# Patient Record
Sex: Male | Born: 1955
Health system: Southern US, Community
[De-identification: ages and names within clinical notes are randomized; demographics above are authoritative.]

## PROBLEM LIST (undated history)

## (undated) DIAGNOSIS — R55 Syncope and collapse: Secondary | ICD-10-CM

## (undated) DIAGNOSIS — Z953 Presence of xenogenic heart valve: Secondary | ICD-10-CM

## (undated) DIAGNOSIS — Z8673 Personal history of transient ischemic attack (TIA), and cerebral infarction without residual deficits: Secondary | ICD-10-CM

## (undated) DIAGNOSIS — I350 Nonrheumatic aortic (valve) stenosis: Secondary | ICD-10-CM

## (undated) DIAGNOSIS — E785 Hyperlipidemia, unspecified: Secondary | ICD-10-CM

## (undated) DIAGNOSIS — D696 Thrombocytopenia, unspecified: Secondary | ICD-10-CM

---

## 1999-12-31 ENCOUNTER — Emergency Department (HOSPITAL_COMMUNITY): Admission: EM | Admit: 1999-12-31 | Discharge: 1999-12-31 | Payer: Self-pay

## 2005-09-13 ENCOUNTER — Encounter: Admission: RE | Admit: 2005-09-13 | Discharge: 2005-09-13 | Payer: Self-pay | Admitting: Family Medicine

## 2009-08-01 ENCOUNTER — Encounter: Admission: RE | Admit: 2009-08-01 | Discharge: 2009-08-01 | Payer: Self-pay | Admitting: Family Medicine

## 2011-05-25 HISTORY — PX: POSTERIOR CERVICAL LAMINECTOMY: SHX2248

## 2016-06-22 ENCOUNTER — Telehealth: Payer: Self-pay | Admitting: Cardiovascular Disease

## 2016-06-22 NOTE — Telephone Encounter (Signed)
Received records from [facility] for appointment on 07/07/16 with Dr Allyson SabalBerry.  Records put with Dr Hazle CocaBerry's schedule for 07/07/16. lp

## 2016-07-07 ENCOUNTER — Ambulatory Visit (INDEPENDENT_AMBULATORY_CARE_PROVIDER_SITE_OTHER): Payer: BLUE CROSS/BLUE SHIELD | Admitting: Cardiovascular Disease

## 2016-07-07 ENCOUNTER — Other Ambulatory Visit: Payer: Self-pay

## 2016-07-07 ENCOUNTER — Encounter: Payer: Self-pay | Admitting: Cardiovascular Disease

## 2016-07-07 VITALS — BP 158/78 | HR 67 | Ht 71.0 in | Wt 197.8 lb

## 2016-07-07 DIAGNOSIS — I350 Nonrheumatic aortic (valve) stenosis: Secondary | ICD-10-CM

## 2016-07-07 DIAGNOSIS — R0989 Other specified symptoms and signs involving the circulatory and respiratory systems: Secondary | ICD-10-CM

## 2016-07-07 DIAGNOSIS — R011 Cardiac murmur, unspecified: Secondary | ICD-10-CM

## 2016-07-07 NOTE — Assessment & Plan Note (Signed)
Mr. Jerry Odonnell was referred to me by Dr. Melrose NakayamaKindle at [facility] for evaluation of critical AS. He had a murmur which she's had for years auscultated and a 2-D echocardiogram performed 04/28/16 revealing normal LV size and function, moderate concentric LVH with critical AS. His mean gradient was 86 m mercury with a peak velocity of 543 cm/s. His valve was trileaflet and he had a normal descending thoracic aorta. He is essentially asymptomatic. He walks 4-5 miles a day without limitation. He had one episode of dizziness back in August. He will be right left heart cath and consideration of aortic valve replacement. Given the fact that he has a "low risk AS patient". He most likely is not a TAVR candidate. I will arrange for him to undergo right and left heart cath sometime within the next week.The patient understands that risks included but are not limited to stroke (1 in 1000), death (1 in 1000), kidney failure [usually temporary] (1 in 500), bleeding (1 in 200), allergic reaction [possibly serious] (1 in 200). The patient understands and agrees to proceed

## 2016-07-07 NOTE — Patient Instructions (Signed)
Medication Instructions: Your physician recommends that you continue on your current medications as directed. Please refer to the Current Medication list given to you today.   Testing/Procedures: Your physician has requested that you have a R/L cardiac catheterization with Dr. Allyson SabalBerry (when back from trip)--Dx: Aortic Stenosis. Cardiac catheterization is used to diagnose and/or treat various heart conditions. Doctors may recommend this procedure for a number of different reasons. The most common reason is to evaluate chest pain. Chest pain can be a symptom of coronary artery disease (CAD), and cardiac catheterization can show whether plaque is narrowing or blocking your heart's arteries. This procedure is also used to evaluate the valves, as well as measure the blood flow and oxygen levels in different parts of your heart. For further information please visit [email or web address].   Following your catheterization, you will not be allowed to drive for 3 days.  No lifting, pushing, or pulling greater that 10 pounds is allowed for 1 week.  You will be required to have the following tests prior to the procedure:  1. Blood work-the blood work can be done no more than 14 days prior to the procedure.  It can be done at any [facility] lab.  There is one downstairs on the first floor of this building and one in the [facility] building [phone]([address], [address]).  2. Chest Xray-the chest xray order has already been placed at the [facility].      Your physician has requested that you have a carotid duplex. This test is an ultrasound of the carotid arteries in your neck. It looks at blood flow through these arteries that supply the brain with blood. Allow one hour for this exam. There are no restrictions or special instructions.

## 2016-07-07 NOTE — Progress Notes (Signed)
07/07/2016 Jerry Odonnell   01-13-1956  [id number]  Primary Physician Jerry Odonnell, Jerry Odonnell, Jerry Odonnell Primary Cardiologist: Jerry GessJonathan Odonnell Jerry Odonnell Jerry Odonnell Jerry Odonnell, Jerry Odonnell, Jerry Odonnell, Jerry Odonnell  HPI:  Jerry Odonnell is a delightful 61 year old fit-appearing widowed Caucasian male (wife died of colon cancer 4 years ago), father of 2 children with no grandchildren who works as an Presenter, broadcastingN D manager at [employer] . He is accompanied by a friend, Jerry Odonnell. He was referred for evaluation and treatment of severe aortic stenosis. He has no chronic risk factors. He is active and walks 4-5 miles a day. PCP heard a murmur and followed up with a 2-D echocardiogram 04/28/16 that revealed normal LV size and function, moderate concentric LVH with severe aortic stenosis. The mean gradient was 86 mm of mercury. He has had one episode of dizziness back in August. He denies chest pain or shortness of breath.   Current Outpatient Prescriptions  Medication Sig Dispense Refill  . aspirin EC 81 MG tablet Take 81 mg by mouth daily.    [name]. atorvastatin (LIPITOR) 10 MG tablet Take 10 mg by mouth daily.    [name]. EPINEPHrine (EPIPEN 2-PAK) 0.3 mg/0.3 mL IJ SOAJ injection Inject 0.3 mLs into the muscle as directed.    . fluticasone (FLONASE) 50 MCG/ACT nasal spray Place 1 spray into the nose as directed.    . meclizine (ANTIVERT) 25 MG tablet Take 25 mg by mouth as directed.    [name]. CIALIS 5 MG tablet Take 5 mg by mouth daily as needed.  3   No current facility-administered medications for this visit.     Allergies  Allergen Reactions  . Bee Venom Swelling    Social History   Social History  . Marital status: Widowed    Spouse name: N/A  . Number of children: N/A  . Years of education: N/A   Occupational History  . Not on file.   Social History Main Topics  . Smoking status: Never Smoker  . Smokeless tobacco: Never Used  . Alcohol use Not on file  . Drug use: Unknown  . Sexual activity: Not on file   Other Topics Concern  . Not on file   Social  History Narrative  . No narrative on file     Review of Systems: General: negative for chills, fever, night sweats or weight changes.  Cardiovascular: negative for chest pain, dyspnea on exertion, edema, orthopnea, palpitations, paroxysmal nocturnal dyspnea or shortness of breath Dermatological: negative for rash Respiratory: negative for cough or wheezing Urologic: negative for hematuria Abdominal: negative for nausea, vomiting, diarrhea, bright red blood per rectum, melena, or hematemesis Neurologic: negative for visual changes, syncope, or dizziness All other systems reviewed and are otherwise negative except as noted above.    Blood pressure (!) 158/78, pulse 67, height 5\' 11"  (1.803 m), weight 197 lb 12.8 oz (89.7 kg).  General appearance: alert and no distress Neck: no adenopathy, no JVD, supple, symmetrical, trachea midline, thyroid not enlarged, symmetric, no tenderness/mass/nodules and Soft bilateral carotid bruits Lungs: clear to auscultation bilaterally Heart: Typical aortic stenosis murmur Extremities: extremities normal, atraumatic, no cyanosis or edema  EKG normal sinus rhythm at 67 with evidence of LVH and nonspecific ST-T wave changes. I personally reviewed this EKG  ASSESSMENT AND PLAN:   Critical aortic valve stenosis Jerry Odonnell was referred to me by [facility]. Melrose [facility]Kindle at [facility] for evaluation of critical AS. He had a murmur which she's had for years auscultated and a 2-D echocardiogram performed 04/28/16 revealing  normal LV size and function, moderate concentric LVH with critical AS. His mean gradient was 86 m mercury with a peak velocity of 543 cm/s. His valve was trileaflet and he had a normal descending thoracic aorta. He is essentially asymptomatic. He walks 4-5 miles a day without limitation. He had one episode of dizziness back in August. He will be right left heart cath and consideration of aortic valve replacement. Given the fact that he has a "low risk AS patient". He  most likely is not a TAVR candidate. I will arrange for him to undergo right and left heart cath sometime within the next week.The patient understands that risks included but are not limited to stroke (1 in 1000), death (1 in 1000), kidney failure [usually temporary] (1 in 500), bleeding (1 in 200), allergic reaction [possibly serious] (1 in 200). The patient understands and agrees to proceed      Jerry Odonnell Jerry Odonnell [facility], [facility] 07/07/2016 12:10 PM

## 2016-07-09 ENCOUNTER — Inpatient Hospital Stay (HOSPITAL_COMMUNITY)
Admission: EM | Admit: 2016-07-09 | Discharge: 2016-07-19 | DRG: 217 | Disposition: A | Discharge status: Home / Self Care | Payer: BLUE CROSS/BLUE SHIELD | Attending: Thoracic Surgery (Cardiothoracic Vascular Surgery) | Admitting: Thoracic Surgery (Cardiothoracic Vascular Surgery)

## 2016-07-09 ENCOUNTER — Telehealth: Payer: Self-pay | Admitting: Cardiovascular Disease

## 2016-07-09 ENCOUNTER — Encounter (HOSPITAL_COMMUNITY)
Admission: EM | Disposition: A | Discharge status: Home / Self Care | Payer: Self-pay | Source: Home / Self Care | Attending: Interventional Cardiology

## 2016-07-09 ENCOUNTER — Emergency Department (HOSPITAL_COMMUNITY): Payer: BLUE CROSS/BLUE SHIELD

## 2016-07-09 ENCOUNTER — Ambulatory Visit (HOSPITAL_COMMUNITY): Admit: 2016-07-09 | Payer: BLUE CROSS/BLUE SHIELD | Admitting: Cardiovascular Disease

## 2016-07-09 ENCOUNTER — Encounter (HOSPITAL_COMMUNITY): Payer: Self-pay | Admitting: Emergency Medicine

## 2016-07-09 DIAGNOSIS — Z91030 Bee allergy status: Secondary | ICD-10-CM

## 2016-07-09 DIAGNOSIS — I2510 Atherosclerotic heart disease of native coronary artery without angina pectoris: Secondary | ICD-10-CM | POA: Diagnosis not present

## 2016-07-09 DIAGNOSIS — Z953 Presence of xenogenic heart valve: Secondary | ICD-10-CM

## 2016-07-09 DIAGNOSIS — E785 Hyperlipidemia, unspecified: Secondary | ICD-10-CM | POA: Diagnosis present

## 2016-07-09 DIAGNOSIS — L7622 Postprocedural hemorrhage and hematoma of skin and subcutaneous tissue following other procedure: Secondary | ICD-10-CM | POA: Diagnosis not present

## 2016-07-09 DIAGNOSIS — Y838 Other surgical procedures as the cause of abnormal reaction of the patient, or of later complication, without mention of misadventure at the time of the procedure: Secondary | ICD-10-CM | POA: Diagnosis not present

## 2016-07-09 DIAGNOSIS — Z7982 Long term (current) use of aspirin: Secondary | ICD-10-CM

## 2016-07-09 DIAGNOSIS — I350 Nonrheumatic aortic (valve) stenosis: Secondary | ICD-10-CM

## 2016-07-09 DIAGNOSIS — Z8673 Personal history of transient ischemic attack (TIA), and cerebral infarction without residual deficits: Secondary | ICD-10-CM

## 2016-07-09 DIAGNOSIS — J90 Pleural effusion, not elsewhere classified: Secondary | ICD-10-CM | POA: Diagnosis not present

## 2016-07-09 DIAGNOSIS — R748 Abnormal levels of other serum enzymes: Secondary | ICD-10-CM

## 2016-07-09 DIAGNOSIS — D696 Thrombocytopenia, unspecified: Secondary | ICD-10-CM | POA: Diagnosis present

## 2016-07-09 DIAGNOSIS — D62 Acute posthemorrhagic anemia: Secondary | ICD-10-CM | POA: Diagnosis not present

## 2016-07-09 DIAGNOSIS — E8770 Fluid overload, unspecified: Secondary | ICD-10-CM | POA: Diagnosis not present

## 2016-07-09 DIAGNOSIS — R791 Abnormal coagulation profile: Secondary | ICD-10-CM | POA: Diagnosis present

## 2016-07-09 DIAGNOSIS — I7409 Other arterial embolism and thrombosis of abdominal aorta: Secondary | ICD-10-CM

## 2016-07-09 DIAGNOSIS — R55 Syncope and collapse: Secondary | ICD-10-CM | POA: Diagnosis not present

## 2016-07-09 DIAGNOSIS — J9811 Atelectasis: Secondary | ICD-10-CM | POA: Diagnosis not present

## 2016-07-09 DIAGNOSIS — Z4682 Encounter for fitting and adjustment of non-vascular catheter: Secondary | ICD-10-CM

## 2016-07-09 DIAGNOSIS — I447 Left bundle-branch block, unspecified: Secondary | ICD-10-CM | POA: Diagnosis present

## 2016-07-09 DIAGNOSIS — Z79899 Other long term (current) drug therapy: Secondary | ICD-10-CM

## 2016-07-09 DIAGNOSIS — J939 Pneumothorax, unspecified: Secondary | ICD-10-CM

## 2016-07-09 DIAGNOSIS — I7100 Dissection of unspecified site of aorta: Secondary | ICD-10-CM

## 2016-07-09 HISTORY — PX: RIGHT/LEFT HEART CATH AND CORONARY ANGIOGRAPHY: CATH118266

## 2016-07-09 HISTORY — DX: Presence of xenogenic heart valve: Z95.3

## 2016-07-09 HISTORY — DX: Syncope and collapse: R55

## 2016-07-09 HISTORY — DX: Hyperlipidemia, unspecified: E78.5

## 2016-07-09 HISTORY — DX: Nonrheumatic aortic (valve) stenosis: I35.0

## 2016-07-09 HISTORY — DX: Thrombocytopenia, unspecified: D69.6

## 2016-07-09 HISTORY — DX: Personal history of transient ischemic attack (TIA), and cerebral infarction without residual deficits: Z86.73

## 2016-07-09 LAB — POCT I-STAT 3, ART BLOOD GAS (G3+)
ACID-BASE DEFICIT: 2 mmol/L (ref 0.0–2.0)
Bicarbonate: 22.8 mmol/L (ref 20.0–28.0)
O2 Saturation: 99 %
PH ART: 7.394 (ref 7.350–7.450)
TCO2: 24 mmol/L (ref 0–100)
pCO2 arterial: 37.3 mmHg (ref 32.0–48.0)
pO2, Arterial: 116 mmHg — ABNORMAL HIGH (ref 83.0–108.0)

## 2016-07-09 LAB — BASIC METABOLIC PANEL
Anion gap: 8 (ref 5–15)
BUN: 17 mg/dL (ref 6–20)
CO2: 23 mmol/L (ref 22–32)
CREATININE: 0.99 mg/dL (ref 0.61–1.24)
Calcium: 9.1 mg/dL (ref 8.9–10.3)
Chloride: 107 mmol/L (ref 101–111)
GFR calc Af Amer: >60 mL/min (ref >60)
Glucose, Bld: 126 mg/dL — ABNORMAL HIGH (ref 65–99)
Potassium: 4.2 mmol/L (ref 3.5–5.1)
SODIUM: 138 mmol/L (ref 135–145)

## 2016-07-09 LAB — CBC
HCT: 42.6 % (ref 39.0–52.0)
Hemoglobin: 14.7 g/dL (ref 13.0–17.0)
MCH: 31.7 pg (ref 26.0–34.0)
MCHC: 34.5 g/dL (ref 30.0–36.0)
MCV: 92 fL (ref 78.0–100.0)
PLATELETS: 136 10*3/uL — AB (ref 150–400)
RBC: 4.63 MIL/uL (ref 4.22–5.81)
RDW: 13 % (ref 11.5–15.5)
WBC: 8.5 10*3/uL (ref 4.0–10.5)

## 2016-07-09 LAB — POCT I-STAT 3, VENOUS BLOOD GAS (G3P V)
Acid-base deficit: 2 mmol/L (ref 0.0–2.0)
Acid-base deficit: 2 mmol/L (ref 0.0–2.0)
Bicarbonate: 21.7 mmol/L (ref 20.0–28.0)
Bicarbonate: 23.1 mmol/L (ref 20.0–28.0)
O2 SAT: 76 %
O2 Saturation: 72 %
PCO2 VEN: 40.7 mmHg — AB (ref 44.0–60.0)
PH VEN: 7.362 (ref 7.250–7.430)
PH VEN: 7.405 (ref 7.250–7.430)
TCO2: 23 mmol/L (ref 0–100)
TCO2: 24 mmol/L (ref 0–100)
pCO2, Ven: 34.5 mmHg — ABNORMAL LOW (ref 44.0–60.0)
pO2, Ven: 39 mmHg (ref 32.0–45.0)
pO2, Ven: 40 mmHg (ref 32.0–45.0)

## 2016-07-09 LAB — PROTIME-INR
INR: 1.17
Prothrombin Time: 15 seconds (ref 11.4–15.2)

## 2016-07-09 LAB — TROPONIN I
TROPONIN I: 0.22 ng/mL — AB (ref <0.03)
TROPONIN I: 0.15 ng/mL — AB (ref <0.03)
Troponin I: 0.08 ng/mL (ref <0.03)

## 2016-07-09 LAB — HEPATIC FUNCTION PANEL
ALBUMIN: 3.8 g/dL (ref 3.5–5.0)
ALT: 42 U/L (ref 17–63)
AST: 28 U/L (ref 15–41)
Alkaline Phosphatase: 70 U/L (ref 38–126)
Bilirubin, Direct: 0.2 mg/dL (ref 0.1–0.5)
Indirect Bilirubin: 1.2 mg/dL — ABNORMAL HIGH (ref 0.3–0.9)
TOTAL PROTEIN: 5.8 g/dL — AB (ref 6.5–8.1)
Total Bilirubin: 1.4 mg/dL — ABNORMAL HIGH (ref 0.3–1.2)

## 2016-07-09 LAB — MAGNESIUM: MAGNESIUM: 2.1 mg/dL (ref 1.7–2.4)

## 2016-07-09 LAB — URINALYSIS, ROUTINE W REFLEX MICROSCOPIC
Bilirubin Urine: NEGATIVE
GLUCOSE, UA: NEGATIVE mg/dL
Hgb urine dipstick: NEGATIVE
KETONES UR: 5 mg/dL — AB
LEUKOCYTES UA: NEGATIVE
NITRITE: NEGATIVE
PROTEIN: 100 mg/dL — AB
Specific Gravity, Urine: 1.026 (ref 1.005–1.030)
pH: 5 (ref 5.0–8.0)

## 2016-07-09 LAB — T4, FREE: FREE T4: 1.32 ng/dL — AB (ref 0.61–1.12)

## 2016-07-09 LAB — TSH: TSH: 0.415 u[IU]/mL (ref 0.350–4.500)

## 2016-07-09 SURGERY — RIGHT/LEFT HEART CATH AND CORONARY ANGIOGRAPHY
Anesthesia: LOCAL

## 2016-07-09 MED ORDER — IOPAMIDOL (ISOVUE-370) INJECTION 76%
INTRAVENOUS | Status: DC | PRN
  Administered 2016-07-09: 120 mL via INTRA_ARTERIAL

## 2016-07-09 MED ORDER — SODIUM CHLORIDE 0.9 % IV SOLN
250.0000 mL | INTRAVENOUS | Status: DC | PRN
  Administered 2016-07-14: 12:00:00 via INTRAVENOUS

## 2016-07-09 MED ORDER — MIDAZOLAM HCL 2 MG/2ML IJ SOLN
INTRAMUSCULAR | Status: AC
  Filled 2016-07-09: qty 2

## 2016-07-09 MED ORDER — SODIUM CHLORIDE 0.9% FLUSH
3.0000 mL | Freq: Two times a day (BID) | INTRAVENOUS | Status: DC
  Administered 2016-07-10 – 2016-07-13 (×4): 3 mL via INTRAVENOUS

## 2016-07-09 MED ORDER — SODIUM CHLORIDE 0.9 % IV SOLN: 250.0000 mL | INTRAVENOUS | Status: DC | PRN

## 2016-07-09 MED ORDER — LIDOCAINE HCL (PF) 1 % IJ SOLN
INTRAMUSCULAR | Status: AC
  Filled 2016-07-09: qty 30

## 2016-07-09 MED ORDER — SODIUM CHLORIDE 0.9 % WEIGHT BASED INFUSION: 3.0000 mL/kg/h | INTRAVENOUS | Status: DC

## 2016-07-09 MED ORDER — ACETAMINOPHEN 325 MG PO TABS: 650.0000 mg | ORAL_TABLET | ORAL | Status: DC | PRN

## 2016-07-09 MED ORDER — MIDAZOLAM HCL 2 MG/2ML IJ SOLN
INTRAMUSCULAR | Status: DC | PRN
  Administered 2016-07-09 (×5): 1 mg via INTRAVENOUS

## 2016-07-09 MED ORDER — VERAPAMIL HCL 2.5 MG/ML IV SOLN
INTRAVENOUS | Status: DC | PRN
  Administered 2016-07-09: 10 mL via INTRA_ARTERIAL

## 2016-07-09 MED ORDER — VERAPAMIL HCL 2.5 MG/ML IV SOLN
INTRAVENOUS | Status: AC
  Filled 2016-07-09: qty 2

## 2016-07-09 MED ORDER — FENTANYL CITRATE (PF) 100 MCG/2ML IJ SOLN
INTRAMUSCULAR | Status: AC
  Filled 2016-07-09: qty 2

## 2016-07-09 MED ORDER — ALPRAZOLAM 0.25 MG PO TABS: 0.2500 mg | ORAL_TABLET | Freq: Two times a day (BID) | ORAL | Status: DC | PRN

## 2016-07-09 MED ORDER — SODIUM CHLORIDE 0.9 % IV SOLN: INTRAVENOUS | Status: AC

## 2016-07-09 MED ORDER — ASPIRIN 81 MG PO CHEW: 324.0000 mg | CHEWABLE_TABLET | ORAL | Status: DC

## 2016-07-09 MED ORDER — FENTANYL CITRATE (PF) 100 MCG/2ML IJ SOLN
INTRAMUSCULAR | Status: DC | PRN
  Administered 2016-07-09 (×5): 25 ug via INTRAVENOUS

## 2016-07-09 MED ORDER — IOPAMIDOL (ISOVUE-370) INJECTION 76%
INTRAVENOUS | Status: AC
  Filled 2016-07-09: qty 50

## 2016-07-09 MED ORDER — NITROGLYCERIN 0.4 MG SL SUBL: 0.4000 mg | SUBLINGUAL_TABLET | SUBLINGUAL | Status: DC | PRN

## 2016-07-09 MED ORDER — HEPARIN SODIUM (PORCINE) 1000 UNIT/ML IJ SOLN
INTRAMUSCULAR | Status: DC | PRN
  Administered 2016-07-09: 5000 [IU] via INTRAVENOUS

## 2016-07-09 MED ORDER — SODIUM CHLORIDE 0.9 % IV SOLN
INTRAVENOUS | Status: DC
  Administered 2016-07-09: 13:00:00 via INTRAVENOUS
  Administered 2016-07-09: 250 mL via INTRAVENOUS

## 2016-07-09 MED ORDER — ONDANSETRON HCL 4 MG/2ML IJ SOLN: 4.0000 mg | Freq: Four times a day (QID) | INTRAMUSCULAR | Status: DC | PRN

## 2016-07-09 MED ORDER — ZOLPIDEM TARTRATE 5 MG PO TABS: 5.0000 mg | ORAL_TABLET | Freq: Every evening | ORAL | Status: DC | PRN

## 2016-07-09 MED ORDER — IOPAMIDOL (ISOVUE-370) INJECTION 76%
INTRAVENOUS | Status: AC
  Filled 2016-07-09: qty 100

## 2016-07-09 MED ORDER — ASPIRIN EC 81 MG PO TBEC: 81.0000 mg | DELAYED_RELEASE_TABLET | Freq: Every day | ORAL | Status: DC

## 2016-07-09 MED ORDER — ASPIRIN 81 MG PO CHEW: 81.0000 mg | CHEWABLE_TABLET | ORAL | Status: DC

## 2016-07-09 MED ORDER — HEPARIN SODIUM (PORCINE) 1000 UNIT/ML IJ SOLN
INTRAMUSCULAR | Status: AC
  Filled 2016-07-09: qty 1

## 2016-07-09 MED ORDER — HEPARIN (PORCINE) IN NACL 2-0.9 UNIT/ML-% IJ SOLN
INTRAMUSCULAR | Status: DC | PRN
  Administered 2016-07-09: 1000 mL

## 2016-07-09 MED ORDER — ASPIRIN 300 MG RE SUPP: 300.0000 mg | RECTAL | Status: DC

## 2016-07-09 MED ORDER — HEPARIN SODIUM (PORCINE) 5000 UNIT/ML IJ SOLN: 5000.0000 [IU] | Freq: Three times a day (TID) | INTRAMUSCULAR | Status: DC

## 2016-07-09 MED ORDER — ASPIRIN EC 81 MG PO TBEC
81.0000 mg | DELAYED_RELEASE_TABLET | Freq: Every day | ORAL | Status: DC
  Administered 2016-07-10 – 2016-07-13 (×4): 81 mg via ORAL
  Filled 2016-07-09 (×4): qty 1

## 2016-07-09 MED ORDER — ATORVASTATIN CALCIUM 10 MG PO TABS
10.0000 mg | ORAL_TABLET | Freq: Every day | ORAL | Status: DC
  Administered 2016-07-10 – 2016-07-13 (×4): 10 mg via ORAL
  Filled 2016-07-09 (×4): qty 1

## 2016-07-09 MED ORDER — LIDOCAINE HCL (PF) 1 % IJ SOLN
INTRAMUSCULAR | Status: DC | PRN
  Administered 2016-07-09 (×2): 2 mL

## 2016-07-09 MED ORDER — SODIUM CHLORIDE 0.9% FLUSH: 3.0000 mL | Freq: Two times a day (BID) | INTRAVENOUS | Status: DC

## 2016-07-09 MED ORDER — HEPARIN (PORCINE) IN NACL 2-0.9 UNIT/ML-% IJ SOLN
INTRAMUSCULAR | Status: AC
  Filled 2016-07-09: qty 1000

## 2016-07-09 MED ORDER — SODIUM CHLORIDE 0.9 % WEIGHT BASED INFUSION: 1.0000 mL/kg/h | INTRAVENOUS | Status: DC

## 2016-07-09 MED ORDER — HEPARIN SODIUM (PORCINE) 5000 UNIT/ML IJ SOLN
5000.0000 [IU] | Freq: Three times a day (TID) | INTRAMUSCULAR | Status: DC
  Administered 2016-07-10 – 2016-07-13 (×11): 5000 [IU] via SUBCUTANEOUS
  Filled 2016-07-09 (×11): qty 1

## 2016-07-09 MED ORDER — SODIUM CHLORIDE 0.9% FLUSH: 3.0000 mL | INTRAVENOUS | Status: DC | PRN

## 2016-07-09 MED ORDER — SODIUM CHLORIDE 0.9% FLUSH
3.0000 mL | INTRAVENOUS | Status: DC | PRN
  Administered 2016-07-10: 3 mL via INTRAVENOUS
  Filled 2016-07-09: qty 3

## 2016-07-09 MED ORDER — HEPARIN (PORCINE) IN NACL 2-0.9 UNIT/ML-% IJ SOLN
INTRAMUSCULAR | Status: DC | PRN
  Administered 2016-07-09: 10 mL via INTRA_ARTERIAL

## 2016-07-09 SURGICAL SUPPLY — 18 items
CATH BALLN WEDGE 5F 110CM (CATHETERS) ×2 IMPLANT
CATH INFINITI JR4 5F (CATHETERS) ×2 IMPLANT
CATH LAUNCHER 5F RADR (CATHETERS) ×1 IMPLANT
CATH OPTITORQUE TIG 4.0 5F (CATHETERS) ×2 IMPLANT
CATH SITESEER 5F NTR (CATHETERS) ×2 IMPLANT
CATHETER LAUNCHER 5F RADR (CATHETERS) ×2
COVER PRB 48X5XTLSCP FOLD TPE (BAG) ×1 IMPLANT
COVER PROBE 5X48 (BAG) ×1
DEVICE RAD COMP TR BAND LRG (VASCULAR PRODUCTS) ×2 IMPLANT
GLIDESHEATH SLEND A-KIT 6F 22G (SHEATH) ×2 IMPLANT
GLIDESHEATH SLEND SS 6F .021 (SHEATH) ×2 IMPLANT
GUIDEWIRE INQWIRE 1.5J.035X260 (WIRE) ×1 IMPLANT
INQWIRE 1.5J .035X260CM (WIRE) ×2
KIT HEART LEFT (KITS) ×2 IMPLANT
PACK CARDIAC CATHETERIZATION (CUSTOM PROCEDURE TRAY) ×2 IMPLANT
SHEATH FAST CATH BRACH 5F 5CM (SHEATH) ×2 IMPLANT
TRANSDUCER W/STOPCOCK (MISCELLANEOUS) ×2 IMPLANT
TUBING CIL FLEX 10 FLL-RA (TUBING) ×2 IMPLANT

## 2016-07-09 NOTE — ED Notes (Signed)
Patient received 324mg  aspirin PTA.

## 2016-07-09 NOTE — H&P (Signed)
Jerry Odonnell is an 61 y.o. male.    Primary Cardiologist:Dr. Adora Fridge  PCP:  Wonda Cerise, MD  Chief Complaint: syncope   HPI: 8 yom recently seen by Dr. Adora Fridge on the 14th of this month for severe aortic stenosis.  His PCP had hear a murmur and echo revealed severe AS.  2-D echocardiogram 04/28/16 revealed normal LV size and function, moderate concentric LVH with severe aortic stenosis. The mean gradient was 86 mm of mercury,  with a peak velocity of 543 cm/s. His valve was trileaflet and he had a normal descending thoracic aorta. He had been essentially asymptomatic.  Pt told Dr. Adora Fridge  has had one episode of dizziness back in August.   Dr. Gwenlyn Found dx. Critical AS and plans for cath next week.    Today pt was walking with friend when he became lightheaded -he thought he needed to eat- but when started home he had increased lightheadedness then after sitting had LOC.  No loss of bladder or bowel control, was out 2-3 min.  No chest pain and no SOB.  No confusion.  EKG with no acute changes from office.  Troponin 0.08.   Pt normally walks 4-5 miles per day without problems.  Currently lying in bed without chest pain or SOB.  Pt without HTN, CVA or diabetes.   Lytes are normal, pts at 136.  CXR without acute process.  Please note pt prefers TAVR,  But at this point he may be too low risk.    Past Medical History:  Diagnosis Date  . Aortic stenosis   . Hyperlipidemia   . Syncope and collapse 07/09/2016  . Thrombocytopenia ([place])     Past Surgical History:  Procedure Laterality Date  . CERVICAL SPINE SURGERY  2013  . pinch nerve in neck  N/A 15 years ago    Family History  Problem Relation Age of Onset  . Healthy Brother   please note one brother with heart valve replacement .     Social History:  reports that he has never smoked. He has never used smokeless tobacco. He reports that he drinks about 4.2 oz of alcohol per week . He reports that he does not  use drugs.  Allergies:  Allergies  Allergen Reactions  . Bee Venom Swelling   OUTPATIENT MEDICATIONS: No current facility-administered medications on file prior to encounter.    Current Outpatient Prescriptions on File Prior to Encounter  Medication Sig Dispense Refill  . aspirin EC 81 MG tablet Take 81 mg by mouth daily.    [name] atorvastatin (LIPITOR) 10 MG tablet Take 10 mg by mouth daily.    [name] CIALIS 5 MG tablet Take 5 mg by mouth daily as needed.  3  . EPINEPHrine (EPIPEN 2-PAK) 0.3 mg/0.3 mL IJ SOAJ injection Inject 0.3 mLs into the muscle as directed.    . fluticasone (FLONASE) 50 MCG/ACT nasal spray Place 1 spray into the nose as directed.    . meclizine (ANTIVERT) 25 MG tablet Take 25 mg by mouth as directed.       Results for orders placed or performed during the hospital encounter of 07/09/16 (from the past 48 hour(s))  Basic metabolic panel     Status: Abnormal   Collection Time: 07/09/16  9:22 AM  Result Value Ref Range   Sodium 138 135 - 145 mmol/L   Potassium 4.2 3.5 - 5.1 mmol/L   Chloride 107 101 -  111 mmol/L   CO2 23 22 - 32 mmol/L   Glucose, Bld 126 (H) 65 - 99 mg/dL   BUN 17 6 - 20 mg/dL   Creatinine, Ser 0.99 0.61 - 1.24 mg/dL   Calcium 9.1 8.9 - 10.3 mg/dL   GFR calc non Af Amer >60 >60 mL/min   GFR calc Af Amer >60 >60 mL/min    Comment: (NOTE) The eGFR has been calculated using the CKD EPI equation. This calculation has not been validated in all clinical situations. eGFR's persistently <60 mL/min signify possible Chronic Kidney Disease.    Anion gap 8 5 - 15  CBC     Status: Abnormal   Collection Time: 07/09/16  9:22 AM  Result Value Ref Range   WBC 8.5 4.0 - 10.5 K/uL   RBC 4.63 4.22 - 5.81 MIL/uL   Hemoglobin 14.7 13.0 - 17.0 g/dL   HCT 42.6 39.0 - 52.0 %   MCV 92.0 78.0 - 100.0 fL   MCH 31.7 26.0 - 34.0 pg   MCHC 34.5 30.0 - 36.0 g/dL   RDW 13.0 11.5 - 15.5 %   Platelets 136 (L) 150 - 400 K/uL  Urinalysis, Routine w reflex microscopic      Status: Abnormal   Collection Time: 07/09/16  9:22 AM  Result Value Ref Range   Color, Urine AMBER (A) YELLOW    Comment: BIOCHEMICALS MAY BE AFFECTED BY COLOR   APPearance HAZY (A) CLEAR   Specific Gravity, Urine 1.026 1.005 - 1.030   pH 5.0 5.0 - 8.0   Glucose, UA NEGATIVE NEGATIVE mg/dL   Hgb urine dipstick NEGATIVE NEGATIVE   Bilirubin Urine NEGATIVE NEGATIVE   Ketones, ur 5 (A) NEGATIVE mg/dL   Protein, ur 100 (A) NEGATIVE mg/dL   Nitrite NEGATIVE NEGATIVE   Leukocytes, UA NEGATIVE NEGATIVE  Troponin I     Status: Abnormal   Collection Time: 07/09/16 10:01 AM  Result Value Ref Range   Troponin I 0.08 (HH) <0.03 ng/mL    Comment: CRITICAL RESULT CALLED TO, READ BACK BY AND VERIFIED WITH: C.ROWE,RN 1057 07/09/16 CLARK,S    Dg Chest 2 View  Result Date: 07/09/2016 CLINICAL DATA:  Syncope EXAM: CHEST  2 VIEW COMPARISON:  September 13, 2005 FINDINGS: The lungs are clear. The heart size and pulmonary vascularity are normal. No adenopathy. No pneumothorax. No bone lesions. There is eventration of the C7 transverse processes bilaterally. IMPRESSION: No edema or consolidation. Electronically Signed   By: Lowella Grip III M.D.   On: 07/09/2016 10:41    ROS: General:no colds or fevers, no weight changes Skin:no rashes or ulcers HEENT:no blurred vision, no congestion CV:see HPI PUL:see HPI GI:no diarrhea constipation or melena, no indigestion GU:no hematuria, no dysuria MS:no joint pain, no claudication Neuro:+ syncope, no lightheadedness Endo:no diabetes, no thyroid disease   Blood pressure 116/83, pulse 67, temperature 97.8 F (36.6 C), temperature source Oral, resp. rate 20, SpO2 98 %. PE: General:Pleasant affect, NAD Skin:Warm and dry, brisk capillary refill HEENT:normocephalic, sclera clear, mucus membranes moist Neck:supple, no JVD, + carotid bruits may just be radiation of murmur Heart:S1S2 RRR with 3-4 systolic harsh murmur, no gallup, rub or click Lungs:clear without  rales, rhonchi, or wheezes AJG:OTLX, non tender, + BS, do not palpate liver spleen or masses Ext:no lower ext edema, 2+ pedal pulses, 2+ radial pulses Neuro:alert and oriented X 3, MAE, follows commands, + facial symmetry   Assessment/Plan  Principal Problem:   Syncope and collapse Active Problems:   Critical  aortic valve stenosis   Hyperlipidemia   Thrombocytopenia (HCC)   1. Syncope may be due to critical AS.  Will plan for rt and lt cardiac cath today. - obs overnight at least depending on cath. Dr. Irish Lack has seen  -Please note pt prefers TAVR,     2.  Critical AS as above  3. Elevated troponin very mildly elevated will do serial troponins.Cecilie Kicks Nurse Practitioner Certified [place] Pager [phone] or after 5pm or weekends call [phone] 07/09/2016, 12:29 PM   I have examined the patient and reviewed assessment and plan and discussed with patient.  Agree with above as stated.  Syncope in the setting of severe AS.  Echo done in [place], not in the [facility] system.  Report to be scanned.  Troponin elevation may have been demand ischemia with the syncope.  Plan for cath today.  Procedure explained in detail.  All questions answered.    Of note, patient would prefer TAVR if this is an option.  Would consult Dr. Roxy Manns or Dr. Cyndia Bent post cath.  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  I explained to him that we needed the information from the cath to determine what type of Ao valve replacement he would have.    He has carotid Doppler scheduled.  He has bilateral carotid bruits but I think this is due to the loud aortic murmur that he has. No edema, no crackles.  No signs of heart failure.  2+ radial pulse.  He also wants to update his HIPAA form to allow [place] to speak with his friend who has accompanied him today.    Would not cross aortic valve as part of this  study.  Dr. Gwenlyn Found told him there was no need.  Just coronary angio with right heart cath is needed.   Larae Grooms

## 2016-07-09 NOTE — Care Management Note (Addendum)
Case Management Note  Patient Details  Name: Lorene DyDirk Cooper Roig MRN: [id number] Date of Birth: 07-30-1955  Subjective/Objective:  S/p heart cath, has severe aortic stenosis, ct consulted for aortic stenosis surgery, patient lives alone, he has a pcp, he will have transportation at [employer] by his friend Fannie KneeSue , he has medication coverage.  NCM will cont to follow for dc needs.                Action/Plan:   Expected Discharge Date:                  Expected Discharge Plan:  Home w Home Health Services  In-House Referral:     Discharge planning Services     Post Acute Care Choice:    Choice offered to:     DME Arranged:    DME Agency:     HH Arranged:    HH Agency:     Status of Service:  In process, will continue to follow  If discussed at Long Length of Stay Meetings, dates discussed:    Additional Comments:  Leone Havenaylor, Calven Gilkes [place], RN 07/09/2016, 5:01 PM

## 2016-07-09 NOTE — Interval H&P Note (Signed)
History and Physical Interval Note:  07/09/2016 2:17 PM  Jerry Odonnell  has presented today for surgery, with the diagnosis of Aortic valve stenosis  The various methods of treatment have been discussed with the patient and family. After consideration of risks, benefits and other options for treatment, the patient has consented to  Procedure(s): Right/Left Heart Cath and Coronary Angiography (N/A) as a surgical intervention .  The patient's history has been reviewed, patient examined, no change in status, stable for surgery.  I have reviewed the patient's chart and labs.  Questions were answered to the patient's satisfaction.     Bryan Lemmaavid Harding

## 2016-07-09 NOTE — Telephone Encounter (Signed)
Spoke with sue, the patient is currently in the hosp and has seen laura ingold and dr Eldridge Dacevaranasi. They do not need anything at this time.

## 2016-07-09 NOTE — Progress Notes (Signed)
Dr. Eldridge DaceVaranasi requested CVTS consult on this patient - Nada BoozerLaura Ingold spoke with Dr. Laneta SimmersBartle this afternoon. Gustavus Haskin PA-C

## 2016-07-09 NOTE — Telephone Encounter (Signed)
Pt gave verbal perm. To speak to his friend Fannie KneeSue.    In regards to his visit to the ER this morning.    Many test are being run.  See notes.  They just wanted to give you a heads up.  Pt would like a call back please.

## 2016-07-09 NOTE — ED Triage Notes (Signed)
Patient from home with EMS after syncopal episode.  Patient was outside going for a walk when he started to feel dizzy.  Patient attempted to sit down and became unconscious per witness for approximately 1-2 minutes.  Patient has a cardiac catheterization scheduled for this Thursday.  Patient denies chest pain at any point today, has no recollection of syncopal episode and per witness was completely unresponsive but very diaphoretic.  Patient alert and oriented and in no apparent distress at this time.

## 2016-07-09 NOTE — ED Provider Notes (Signed)
[facility]-EMERGENCY DEPT Provider Note   CSN: [id number] Arrival date & time: 07/09/16  7829     History   Chief Complaint Chief Complaint  Patient presents with  . Loss of Consciousness    HPI Jerry Odonnell is a 61 y.o. male.  Patient is a 61 y/o M with PMHx of aortic stenosis who presents to ED for syncope, states he was walking outside with a friend when started to feel dizzy, sat down in grass and subsequently lost consciousness for approx 1 minute. Denies any additional preceding symptoms. Witnessed by friend at bedside, states he became pale and diaphoretic, no head trauma, denies seizure like activity. Pt reports similar episodes of dizziness (without LOC) in August 2017 and December 2017, evaluated by PCP and referred to cardiology at that time. Recently evaluated by Dr. Allyson Sabal cardiology and has cardiac cath scheduled for 07/15/16. Last echo 04/28/16- moderate LVH, severe aortic stenosis. On 81mg  ASA daily, received 324mg  ASA today. Denies fevers, chills, unexplained weight loss, diplopia, tinnitus, headache, speech changes, neck pain, Cp, SOB, cough, abd pain, n/v/d, dysuria, extremity numbness/tingling/pain, or any additional concerns.   The history is provided by the patient. No language interpreter was used.    Past Medical History:  Diagnosis Date  . Aortic stenosis   . Hyperlipidemia   . Syncope and collapse 07/09/2016  . Thrombocytopenia [facility])     Patient Active Problem List   Diagnosis Date Noted  . Syncope and collapse 07/09/2016  . Hyperlipidemia 07/09/2016  . Thrombocytopenia (HCC) 07/09/2016  . Critical aortic valve stenosis 07/07/2016    Past Surgical History:  Procedure Laterality Date  . CERVICAL SPINE SURGERY  2013  . pinch nerve in neck  N/A 15 years ago       Home Medications    Prior to Admission medications   Medication Sig Start Date End Date Taking? Authorizing Provider  aspirin EC 81 MG tablet Take 81 mg by mouth daily. 01/17/16  Yes  Historical Provider, MD  atorvastatin (LIPITOR) 10 MG tablet Take 10 mg by mouth daily. 03/23/16  Yes Historical Provider, MD  CIALIS 5 MG tablet Take 5-10 mg by mouth daily as needed.  06/10/16  Yes Historical Provider, MD  dextromethorphan (DELSYM) 30 MG/5ML liquid Take 60 mg by mouth 2 (two) times daily.   Yes Historical Provider, MD  EPINEPHrine (EPIPEN 2-PAK) 0.3 mg/0.3 mL IJ SOAJ injection Inject 0.3 mLs into the muscle as directed. 03/23/16  Yes Historical Provider, MD  fluticasone (FLONASE) 50 MCG/ACT nasal spray Place 1 spray into the nose as directed. 05/12/16  Yes Historical Provider, MD  meclizine (ANTIVERT) 25 MG tablet Take 25 mg by mouth as directed. 01/17/16  Yes Historical Provider, MD    Family History Family History  Problem Relation Age of Onset  . Healthy Brother     Social History Social History  Substance Use Topics  . Smoking status: Never Smoker  . Smokeless tobacco: Never Used  . Alcohol use 4.2 oz/week    7 Glasses of wine per week     Comment: approximately 1 drink per day     Allergies   Bee venom   Review of Systems Review of Systems  Constitutional: Negative for chills, fever and unexpected weight change.  HENT: Negative for congestion, ear pain and tinnitus.   Eyes: Negative for photophobia, pain and visual disturbance.  Respiratory: Negative for cough and shortness of breath.   Cardiovascular: Negative for chest pain, palpitations and leg swelling.  Gastrointestinal: Negative  for abdominal pain, diarrhea, nausea and vomiting.  Genitourinary: Negative for dysuria.  Musculoskeletal: Negative for back pain, neck pain and neck stiffness.  Skin: Negative for rash.  Neurological: Positive for dizziness and syncope. Negative for seizures, speech difficulty, weakness, numbness and headaches.     Physical Exam Updated Vital Signs BP (!) 119/57 (BP Location: Left Arm)   Pulse (!) 56   Temp 97.8 F (36.6 C) (Oral)   Resp 12   SpO2 98%    Physical Exam  Constitutional: He is oriented to person, place, and time. He appears well-developed and well-nourished.  HENT:  Head: Normocephalic and atraumatic. Head is without raccoon's eyes and without Battle's sign.  Right Ear: Tympanic membrane, external ear and ear canal normal. No hemotympanum.  Left Ear: Tympanic membrane, external ear and ear canal normal. No hemotympanum.  Nose: Nose normal.  Mouth/Throat: Oropharynx is clear and moist and mucous membranes are normal.  Eyes: Conjunctivae and EOM are normal. Pupils are equal, round, and reactive to light. Right eye exhibits no nystagmus. Left eye exhibits no nystagmus.  Neck: Normal range of motion and full passive range of motion without pain. Neck supple. No JVD present. No spinous process tenderness present. No neck rigidity.  Cardiovascular: Normal rate, regular rhythm and intact distal pulses.  Exam reveals no gallop and no friction rub.   Murmur heard. Pulmonary/Chest: Effort normal and breath sounds normal. He has no wheezes. He has no rales.  Abdominal: Soft. Bowel sounds are normal. There is no tenderness. There is no rebound.  Musculoskeletal: Normal range of motion. He exhibits no edema or tenderness.  Neurological: He is alert and oriented to person, place, and time. No cranial nerve deficit. He displays a negative Romberg sign.  Gait steady with ambulation. Strength 5/5 to UE and Le. No facial asymmetry noted. Finger to nose intact.  Skin: Skin is warm and dry.  Nursing note and vitals reviewed.    ED Treatments / Results  Labs (all labs ordered are listed, but only abnormal results are displayed) Labs Reviewed  BASIC METABOLIC PANEL - Abnormal; Notable for the following:       Result Value   Glucose, Bld 126 (*)    All other components within normal limits  CBC - Abnormal; Notable for the following:    Platelets 136 (*)    All other components within normal limits  URINALYSIS, ROUTINE W REFLEX MICROSCOPIC  - Abnormal; Notable for the following:    Color, Urine AMBER (*)    APPearance HAZY (*)    Ketones, ur 5 (*)    Protein, ur 100 (*)    All other components within normal limits  TROPONIN I - Abnormal; Notable for the following:    Troponin I 0.08 (*)    All other components within normal limits  T4, FREE - Abnormal; Notable for the following:    Free T4 1.32 (*)    All other components within normal limits  TROPONIN I - Abnormal; Notable for the following:    Troponin I 0.22 (*)    All other components within normal limits  TSH  PROTIME-INR  TROPONIN I  TROPONIN I  HEMOGLOBIN A1C  CBG MONITORING, ED    EKG  EKG Interpretation  Date/Time:  Friday July 09 2016 09:03:32 EST Ventricular Rate:  60 PR Interval:    QRS Duration: 104 QT Interval:  433 QTC Calculation: 433 R Axis:   -4 Text Interpretation:  Sinus rhythm Left atrial enlargement RSR' in V1  or V2, probably normal variant LVH with secondary repolarization abnormality Anterior ST elevation, probably due to LVH No previous ECGs available Confirmed by ZACKOWSKI  MD, SCOTT [phone]) on 07/09/2016 9:29:35 AM       Radiology Dg Chest 2 View  Result Date: 07/09/2016 CLINICAL DATA:  Syncope EXAM: CHEST  2 VIEW COMPARISON:  September 13, 2005 FINDINGS: The lungs are clear. The heart size and pulmonary vascularity are normal. No adenopathy. No pneumothorax. No bone lesions. There is eventration of the C7 transverse processes bilaterally. IMPRESSION: No edema or consolidation. Electronically Signed   By: Bretta Bang III M.D.   On: 07/09/2016 10:41    Procedures Procedures (including critical care time)  Medications Ordered in ED Medications  0.9 %  sodium chloride infusion (250 mLs Intravenous Bolus from Bag 07/09/16 1520)     Initial Impression / Assessment and Plan / ED Course  I have reviewed the triage vital signs and the nursing notes.  Pertinent labs & imaging results that were available during my care of the  patient were reviewed by me and considered in my medical decision making (see chart for details).  Clinical Course as of Jul 10 1619  Fri Jul 09, 2016  1618 ED EKG [RW]  1618 EKG 12-Lead [RW]    Clinical Course User Index [RW] Albesa Seen, NP  Pt is a 61 y/o M who presents to ED for syncope. EKG noted to have ST elevation V1-3, EKG on 07/07/16 with similar changes. Will get cxr for acute pulmonary process and CBC, CMP, trop. Pt currently asymptomatic, no additional concerns at this time. Will continue to monitor; plan to discuss with cards covering for Dr. Allyson Sabal  11:01 AM Troponin 0.08, pt updated on results; cardiology paged for admission  11:17 Spoke with cardiology, plan to admit to their service  Final Clinical Impressions(s) / ED Diagnoses   Final diagnoses:  Syncope, unspecified syncope type    New Prescriptions Current Discharge Medication List       Albesa Seen, NP 07/09/16 1626    Vanetta Mulders, MD 07/10/16 [phone]

## 2016-07-10 DIAGNOSIS — R55 Syncope and collapse: Secondary | ICD-10-CM | POA: Diagnosis present

## 2016-07-10 DIAGNOSIS — J90 Pleural effusion, not elsewhere classified: Secondary | ICD-10-CM | POA: Diagnosis not present

## 2016-07-10 DIAGNOSIS — Y838 Other surgical procedures as the cause of abnormal reaction of the patient, or of later complication, without mention of misadventure at the time of the procedure: Secondary | ICD-10-CM | POA: Diagnosis not present

## 2016-07-10 DIAGNOSIS — L7622 Postprocedural hemorrhage and hematoma of skin and subcutaneous tissue following other procedure: Secondary | ICD-10-CM | POA: Diagnosis not present

## 2016-07-10 DIAGNOSIS — R791 Abnormal coagulation profile: Secondary | ICD-10-CM | POA: Diagnosis present

## 2016-07-10 DIAGNOSIS — E8770 Fluid overload, unspecified: Secondary | ICD-10-CM | POA: Diagnosis not present

## 2016-07-10 DIAGNOSIS — Z01810 Encounter for preprocedural cardiovascular examination: Secondary | ICD-10-CM | POA: Diagnosis not present

## 2016-07-10 DIAGNOSIS — I350 Nonrheumatic aortic (valve) stenosis: Secondary | ICD-10-CM | POA: Diagnosis present

## 2016-07-10 DIAGNOSIS — Z8673 Personal history of transient ischemic attack (TIA), and cerebral infarction without residual deficits: Secondary | ICD-10-CM | POA: Diagnosis not present

## 2016-07-10 DIAGNOSIS — I447 Left bundle-branch block, unspecified: Secondary | ICD-10-CM | POA: Diagnosis present

## 2016-07-10 DIAGNOSIS — J9811 Atelectasis: Secondary | ICD-10-CM | POA: Diagnosis not present

## 2016-07-10 DIAGNOSIS — Z7982 Long term (current) use of aspirin: Secondary | ICD-10-CM | POA: Diagnosis not present

## 2016-07-10 DIAGNOSIS — Z91030 Bee allergy status: Secondary | ICD-10-CM | POA: Diagnosis not present

## 2016-07-10 DIAGNOSIS — D62 Acute posthemorrhagic anemia: Secondary | ICD-10-CM | POA: Diagnosis not present

## 2016-07-10 DIAGNOSIS — Z79899 Other long term (current) drug therapy: Secondary | ICD-10-CM | POA: Diagnosis not present

## 2016-07-10 DIAGNOSIS — I2510 Atherosclerotic heart disease of native coronary artery without angina pectoris: Secondary | ICD-10-CM | POA: Diagnosis present

## 2016-07-10 DIAGNOSIS — E785 Hyperlipidemia, unspecified: Secondary | ICD-10-CM | POA: Diagnosis present

## 2016-07-10 DIAGNOSIS — I9789 Other postprocedural complications and disorders of the circulatory system, not elsewhere classified: Secondary | ICD-10-CM | POA: Diagnosis not present

## 2016-07-10 DIAGNOSIS — D696 Thrombocytopenia, unspecified: Secondary | ICD-10-CM | POA: Diagnosis present

## 2016-07-10 LAB — CBC
HEMATOCRIT: 45.1 % (ref 39.0–52.0)
HEMOGLOBIN: 15.4 g/dL (ref 13.0–17.0)
MCH: 31.8 pg (ref 26.0–34.0)
MCHC: 34.1 g/dL (ref 30.0–36.0)
MCV: 93.2 fL (ref 78.0–100.0)
Platelets: 144 10*3/uL — ABNORMAL LOW (ref 150–400)
RBC: 4.84 MIL/uL (ref 4.22–5.81)
RDW: 13.1 % (ref 11.5–15.5)
WBC: 8.3 10*3/uL (ref 4.0–10.5)

## 2016-07-10 LAB — TROPONIN I: TROPONIN I: 0.28 ng/mL — AB (ref <0.03)

## 2016-07-10 LAB — HEMOGLOBIN A1C
HEMOGLOBIN A1C: 5.4 % (ref 4.8–5.6)
Mean Plasma Glucose: 108 mg/dL

## 2016-07-10 NOTE — Progress Notes (Addendum)
Patient Name: Jerry HazyDirk Cooper Ragone Date of Encounter: 07/10/2016  [facility]  Hospital Problem List     Principal Problem:   Syncope and collapse Active Problems:   Critical aortic valve stenosis   Hyperlipidemia   Thrombocytopenia (HCC)     Subjective   No acute overnight events. Status post R/LHC on 07/09/16 shoing minimal CAD and severe AS. Not a TAVR candidate as he is too low-risk. Seen by CVTS on 2/16, he is interested in minimally invasive procedure if found to be a candidate. Will be seen by Dr. Cornelius Moraswen on 2/19 to discuss further. BP and labs stable.   Inpatient Medications    Scheduled Meds: . aspirin EC  81 mg Oral Daily  . atorvastatin  10 mg Oral q1800  . heparin  5,000 Units Subcutaneous Q8H  . sodium chloride flush  3 mL Intravenous Q12H   Continuous Infusions:  PRN Meds: sodium chloride, acetaminophen, ALPRAZolam, nitroGLYCERIN, ondansetron (ZOFRAN) IV, sodium chloride flush, zolpidem   Vital Signs    Vitals:   07/09/16 2000 07/09/16 2007 07/10/16 0410 07/10/16 0736  BP: (!) 150/77 (!) 143/71 127/61 (!) 108/53  Pulse: 70 66 (!) 56 63  Resp: 15 11 20 20   Temp:  97.9 F (36.6 C) 97.1 F (36.2 C) 98.2 F (36.8 C)  TempSrc:  Oral Oral Oral  SpO2: 96% 99% 100% 97%  Weight:   196 lb 8 oz (89.1 kg)     Intake/Output Summary (Last 24 hours) at 07/10/16 0812 Last data filed at 07/10/16 0738  Gross per 24 hour  Intake              660 ml  Output             1250 ml  Net             -590 ml   Filed Weights   07/10/16 0410  Weight: 196 lb 8 oz (89.1 kg)    Physical Exam    GEN: Well nourished, well developed, in no acute distress.  HEENT: Grossly normal.  Neck: Supple, no JVD, carotid bruits, or masses. Cardiac: RRR, III/VI harsh systolic murmur RUSB radiating to the neck, no rubs, or gallops. No clubbing, cyanosis, edema.  Radials/DP/PT 2+ and equal bilaterally. Right upper extremity mildly swollen with right radial cath site  without bleeding, erythema, hematoma. Radial pulse 2+. Respiratory:  Respirations regular and unlabored, clear to auscultation bilaterally. GI: Soft, nontender, nondistended, BS + x 4. MS: no deformity or atrophy. Skin: warm and dry, no rash. Neuro:  Strength and sensation are intact. Psych: AAOx3.  Normal affect.  Labs    CBC  Recent Labs  07/09/16 0922  WBC 8.5  HGB 14.7  HCT 42.6  MCV 92.0  PLT 136*   Basic Metabolic Panel  Recent Labs  07/09/16 0922 07/09/16 1837  NA 138  --   K 4.2  --   CL 107  --   CO2 23  --   GLUCOSE 126*  --   BUN 17  --   CREATININE 0.99  --   CALCIUM 9.1  --   MG  --  2.1   Liver Function Tests  Recent Labs  07/09/16 1837  AST 28  ALT 42  ALKPHOS 70  BILITOT 1.4*  PROT 5.8*  ALBUMIN 3.8   No results for input(s): LIPASE, AMYLASE in the last 72 hours. Cardiac Enzymes  Recent Labs  07/09/16 1301 07/09/16 1837 07/09/16 2307  TROPONINI  0.22* 0.15* 0.28*   BNP Invalid input(s): POCBNP D-Dimer No results for input(s): DDIMER in the last 72 hours. Hemoglobin A1C  Recent Labs  07/09/16 1301  HGBA1C 5.4   Fasting Lipid Panel No results for input(s): CHOL, HDL, LDLCALC, TRIG, CHOLHDL, LDLDIRECT in the last 72 hours. Thyroid Function Tests  Recent Labs  07/09/16 1301  TSH 0.415    Telemetry    Sinus rhythm 60's to 70's bpm - Personally Reviewed  ECG    Sinus bradycardia, 55 bpm, lateral TWI - Personally Reviewed  Radiology    Dg Chest 2 View  Result Date: 07/09/2016 CLINICAL DATA:  Syncope EXAM: CHEST  2 VIEW COMPARISON:  September 13, 2005 FINDINGS: The lungs are clear. The heart size and pulmonary vascularity are normal. No adenopathy. No pneumothorax. No bone lesions. There is eventration of the C7 transverse processes bilaterally. IMPRESSION: No edema or consolidation. Electronically Signed   By: Bretta Bang III M.D.   On: 07/09/2016 10:41    Cardiac Studies   [facility] 07/09/16: Conclusion      There is severe aortic valve stenosis.  Essentially minimal CAD   Known severe aortic stenosis with heavily calcified valve angiographically. Very powerful aortic valve jet.   Plan: He will be admitted overnight for CT surgical consultation for possible aortic valve replacement surgery. TR band removal per protocol.   Right Heart   Right Heart Pressures Hemodynamic findings consistent with aortic valve stenosis. PA pressure-mean: 34/13/23 mmHg PCWP: 19/15/12 mmHg Not recorded LV pressure not recorded AoP/MAP: 98/56/72 mmHg. AO sat 99%. PA sat average 74%. Cardiac output/index by Fick: 5.59, 2.66 =normal    Right Atrium Right atrial pressure 10/7/4 mmHg    Right Ventricle RV pressure/RVEDP 36/0/10 mmHg     Left Heart   Left Ventricle Not Assessed    Aortic Valve There is severe aortic valve stenosis. Severe calcification noted with powerful AS Jet.      Patient Profile     61 y.o. male with history of severe aortic stenosis, syncope, HLD, and thrombocytopenia who presented to [facility] with syncopal episode.   Assessment & Plan    1. Syncope due to severe aortic stenosis: -Status post R/LHC that showed minimal CAD -Seen by CVTS, further discussion among CVTS and patient regarding surgical approach pending -Keep inpatient until his surgery given his AS and syncope -Will transfer off 6C -Monitor on telemetry -Echo on file under the media tab of Epic for review -Carotid dopplers noted  2. Nonobstructive CAD: -No symptoms of angina -ASA, Lipitor -Not on beta blocker 2/2 soft SBP  3. Thrombocytopenia: -Check CBC today   Signed, Eula Listen, PA-C [facility] Pager: [phone] 07/10/2016, 8:12 AM    Pateint seen and examined and questions re valve surgery discussed with him.   Darden Palmer MD [facility] 8:42 AM

## 2016-07-10 NOTE — Consult Note (Signed)
[address]       [place] [address]             [phone]      Cardiothoracic Surgery Consultation  Reason for Consult: Critical aortic stenosis Referring Physician: Dr. Charlesetta Garibaldi is an 61 y.o. male.  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 He has had a couple episodes of dizziness occurring in August and December 2017. He was seen by his PCP recently and noted to have a louder heart murmur and an echo on 04/28/2016 at [facility] reportedly showed severe AS with a mean gradient of 67 mm Hg, a peak of 105 mm Hg with a peak velocity of 5.4 m/sec. LV function was normal with mild diastolic dysfunction. I don't have the official complete report, only what was reported in the cardiology notes from Dr. Atilano Median with [facility]. AVR was recommended. The patient was subsequently referred to Dr. Gwenlyn Found and plans were made for cardiac cath in preparation for AVR. Then on 2/16 he had a witnessed syncopal episode while walking with his friend Cranston Neighbor who is here with him now. He started feeling dizzy, sat down in the grass and lost consciousness for about one minute. He was noted to be pale and diaphoretic. He has never had any chest pain or pressure, no shortness of breath or leg edema. He has noted that he is walking slower and not as far before he has to rest. He was admitted and underwent cath yesterday showing minimal non-obstructive coronary artery disease. RHC showed normal pressures with a PA sat of 74% and CI 2.66. The valve was not crossed. There was severe calcification of the valve seen on cine. He has been fine since admission.  He is widowed and lives in [place]. He has two adult children. He has tried to remain active and physically fit. He is interested in TAVR if possible so that he does not have to  be out of work very long.  Past Medical History:  Diagnosis Date  . Aortic stenosis   . Hyperlipidemia   . Syncope and collapse 07/09/2016  . Thrombocytopenia ([place])     Past Surgical History:  Procedure Laterality Date  . CERVICAL SPINE SURGERY  2013  . pinch nerve in neck  N/A 15 years ago  . RIGHT/LEFT HEART CATH AND CORONARY ANGIOGRAPHY N/A 07/09/2016   Procedure: Right/Left Heart Cath and Coronary Angiography;  Surgeon: Leonie Man, MD;  Location: [facility];  Service: Cardiovascular;  Laterality: N/A;    Family History  Problem Relation Age of Onset  . Healthy Brother     Social History:  reports that he has never smoked. He has never used smokeless tobacco. He reports that he drinks about 4.2 oz of alcohol per week . He reports that he does not use drugs.  Allergies:  Allergies  Allergen Reactions  . Bee Venom Swelling    Medications:  I have reviewed the patient's current medications. Prior to Admission:  Prescriptions Prior to Admission  Medication Sig Dispense Refill Last Dose  . aspirin EC 81 MG tablet Take 81 mg by mouth daily.   07/09/2016 at Unknown time  . atorvastatin (LIPITOR) 10 MG tablet Take 10 mg by mouth daily.   07/09/2016 at Unknown time  .  CIALIS 5 MG tablet Take 5-10 mg by mouth daily as needed.   3 07/08/2016 at Unknown time  . dextromethorphan (DELSYM) 30 MG/5ML liquid Take 60 mg by mouth 2 (two) times daily.   07/08/2016 at Unknown time  . EPINEPHrine (EPIPEN 2-PAK) 0.3 mg/0.3 mL IJ SOAJ injection Inject 0.3 mLs into the muscle as directed.    at prn  . fluticasone (FLONASE) 50 MCG/ACT nasal spray Place 1 spray into the nose as directed.    at prn  . meclizine (ANTIVERT) 25 MG tablet Take 25 mg by mouth as directed.    at prn   Scheduled: . aspirin EC  81 mg Oral Daily  . atorvastatin  10 mg Oral q1800  . heparin  5,000 Units Subcutaneous Q8H  . sodium chloride flush  3 mL Intravenous Q12H   Continuous:  RCV:ELFYBO chloride,  acetaminophen, ALPRAZolam, nitroGLYCERIN, ondansetron (ZOFRAN) IV, sodium chloride flush, zolpidem Anti-infectives    None      Results for orders placed or performed during the hospital encounter of 07/09/16 (from the past 48 hour(s))  Basic metabolic panel     Status: Abnormal   Collection Time: 07/09/16  9:22 AM  Result Value Ref Range   Sodium 138 135 - 145 mmol/L   Potassium 4.2 3.5 - 5.1 mmol/L   Chloride 107 101 - 111 mmol/L   CO2 23 22 - 32 mmol/L   Glucose, Bld 126 (H) 65 - 99 mg/dL   BUN 17 6 - 20 mg/dL   Creatinine, Ser 0.99 0.61 - 1.24 mg/dL   Calcium 9.1 8.9 - 10.3 mg/dL   GFR calc non Af Amer >60 >60 mL/min   GFR calc Af Amer >60 >60 mL/min    Comment: (NOTE) The eGFR has been calculated using the CKD EPI equation. This calculation has not been validated in all clinical situations. eGFR's persistently <60 mL/min signify possible Chronic Kidney Disease.    Anion gap 8 5 - 15  CBC     Status: Abnormal   Collection Time: 07/09/16  9:22 AM  Result Value Ref Range   WBC 8.5 4.0 - 10.5 K/uL   RBC 4.63 4.22 - 5.81 MIL/uL   Hemoglobin 14.7 13.0 - 17.0 g/dL   HCT 42.6 39.0 - 52.0 %   MCV 92.0 78.0 - 100.0 fL   MCH 31.7 26.0 - 34.0 pg   MCHC 34.5 30.0 - 36.0 g/dL   RDW 13.0 11.5 - 15.5 %   Platelets 136 (L) 150 - 400 K/uL  Urinalysis, Routine w reflex microscopic     Status: Abnormal   Collection Time: 07/09/16  9:22 AM  Result Value Ref Range   Color, Urine AMBER (A) YELLOW    Comment: BIOCHEMICALS MAY BE AFFECTED BY COLOR   APPearance HAZY (A) CLEAR   Specific Gravity, Urine 1.026 1.005 - 1.030   pH 5.0 5.0 - 8.0   Glucose, UA NEGATIVE NEGATIVE mg/dL   Hgb urine dipstick NEGATIVE NEGATIVE   Bilirubin Urine NEGATIVE NEGATIVE   Ketones, ur 5 (A) NEGATIVE mg/dL   Protein, ur 100 (A) NEGATIVE mg/dL   Nitrite NEGATIVE NEGATIVE   Leukocytes, UA NEGATIVE NEGATIVE  Troponin I     Status: Abnormal   Collection Time: 07/09/16 10:01 AM  Result Value Ref Range    Troponin I 0.08 (HH) <0.03 ng/mL    Comment: CRITICAL RESULT CALLED TO, READ BACK BY AND VERIFIED WITH: C.ROWE,RN 1057 07/09/16 CLARK,S   TSH     Status: None  Collection Time: 07/09/16  1:01 PM  Result Value Ref Range   TSH 0.415 0.350 - 4.500 uIU/mL    Comment: Performed by a 3rd Generation assay with a functional sensitivity of <=0.01 uIU/mL.  T4, free     Status: Abnormal   Collection Time: 07/09/16  1:01 PM  Result Value Ref Range   Free T4 1.32 (H) 0.61 - 1.12 ng/dL    Comment: (NOTE) Biotin ingestion may interfere with free T4 tests. If the results are inconsistent with the TSH level, previous test results, or the clinical presentation, then consider biotin interference. If needed, order repeat testing after stopping biotin.   Troponin I     Status: Abnormal   Collection Time: 07/09/16  1:01 PM  Result Value Ref Range   Troponin I 0.22 (HH) <0.03 ng/mL    Comment: CRITICAL VALUE NOTED.  VALUE IS CONSISTENT WITH PREVIOUSLY REPORTED AND CALLED VALUE.  Hemoglobin A1c     Status: None   Collection Time: 07/09/16  1:01 PM  Result Value Ref Range   Hgb A1c MFr Bld 5.4 4.8 - 5.6 %    Comment: (NOTE)         Pre-diabetes: 5.7 - 6.4         Diabetes: >6.4         Glycemic control for adults with diabetes: <7.0    Mean Plasma Glucose 108 mg/dL    Comment: (NOTE) Performed At: [facility] [place], [place] [id number] Lindon Romp MD ZW:[id number]   Protime-INR     Status: None   Collection Time: 07/09/16  1:01 PM  Result Value Ref Range   Prothrombin Time 15.0 11.4 - 15.2 seconds   INR 1.17   I-STAT 3, arterial blood gas (G3+)     Status: Abnormal   Collection Time: 07/09/16  3:02 PM  Result Value Ref Range   pH, Arterial 7.394 7.350 - 7.450   pCO2 arterial 37.3 32.0 - 48.0 mmHg   pO2, Arterial 116.0 (H) 83.0 - 108.0 mmHg   Bicarbonate 22.8 20.0 - 28.0 mmol/L   TCO2 24 0 - 100 mmol/L   O2 Saturation 99.0 %   Acid-base deficit 2.0 0.0 - 2.0  mmol/L   Patient temperature HIDE    Sample type ARTERIAL   I-STAT 3, venous blood gas (G3P V)     Status: Abnormal   Collection Time: 07/09/16  3:03 PM  Result Value Ref Range   pH, Ven 7.405 7.250 - 7.430   pCO2, Ven 34.5 (L) 44.0 - 60.0 mmHg   pO2, Ven 40.0 32.0 - 45.0 mmHg   Bicarbonate 21.7 20.0 - 28.0 mmol/L   TCO2 23 0 - 100 mmol/L   O2 Saturation 76.0 %   Acid-base deficit 2.0 0.0 - 2.0 mmol/L   Patient temperature HIDE    Sample type VENOUS   I-STAT 3, venous blood gas (G3P V)     Status: Abnormal   Collection Time: 07/09/16  3:19 PM  Result Value Ref Range   pH, Ven 7.362 7.250 - 7.430   pCO2, Ven 40.7 (L) 44.0 - 60.0 mmHg   pO2, Ven 39.0 32.0 - 45.0 mmHg   Bicarbonate 23.1 20.0 - 28.0 mmol/L   TCO2 24 0 - 100 mmol/L   O2 Saturation 72.0 %   Acid-base deficit 2.0 0.0 - 2.0 mmol/L   Patient temperature HIDE    Sample type VENOUS    Comment NOTIFIED PHYSICIAN   Magnesium  Status: None   Collection Time: 07/09/16  6:37 PM  Result Value Ref Range   Magnesium 2.1 1.7 - 2.4 mg/dL  Hepatic function panel     Status: Abnormal   Collection Time: 07/09/16  6:37 PM  Result Value Ref Range   Total Protein 5.8 (L) 6.5 - 8.1 g/dL   Albumin 3.8 3.5 - 5.0 g/dL   AST 28 15 - 41 U/L   ALT 42 17 - 63 U/L   Alkaline Phosphatase 70 38 - 126 U/L   Total Bilirubin 1.4 (H) 0.3 - 1.2 mg/dL   Bilirubin, Direct 0.2 0.1 - 0.5 mg/dL   Indirect Bilirubin 1.2 (H) 0.3 - 0.9 mg/dL  Troponin I     Status: Abnormal   Collection Time: 07/09/16  6:37 PM  Result Value Ref Range   Troponin I 0.15 (HH) <0.03 ng/mL    Comment: CRITICAL VALUE NOTED.  VALUE IS CONSISTENT WITH PREVIOUSLY REPORTED AND CALLED VALUE.  Troponin I     Status: Abnormal   Collection Time: 07/09/16 11:07 PM  Result Value Ref Range   Troponin I 0.28 (HH) <0.03 ng/mL    Comment: CRITICAL VALUE NOTED.  VALUE IS CONSISTENT WITH PREVIOUSLY REPORTED AND CALLED VALUE.    Dg Chest 2 View  Result Date: 07/09/2016 CLINICAL  DATA:  Syncope EXAM: CHEST  2 VIEW COMPARISON:  September 13, 2005 FINDINGS: The lungs are clear. The heart size and pulmonary vascularity are normal. No adenopathy. No pneumothorax. No bone lesions. There is eventration of the C7 transverse processes bilaterally. IMPRESSION: No edema or consolidation. Electronically Signed   By: Lowella Grip III M.D.   On: 07/09/2016 10:41    Review of Systems  Constitutional: Positive for diaphoresis and malaise/fatigue. Negative for chills, fever and weight loss.  HENT: Negative.        See dentist regularly. No problems with teeth  Eyes: Negative.   Respiratory: Negative for cough, shortness of breath and wheezing.   Cardiovascular: Negative for chest pain, palpitations, orthopnea, claudication, leg swelling and PND.  Gastrointestinal: Negative.   Genitourinary: Negative.   Musculoskeletal: Negative.   Skin: Negative.   Neurological: Positive for dizziness and loss of consciousness.  Endo/Heme/Allergies:       Hx of mild thrombocytopenia in the 125 range. His son also has thrombocytopenia.  Psychiatric/Behavioral: Negative.    Blood pressure 127/61, pulse (!) 56, temperature 97.1 F (36.2 C), temperature source Oral, resp. rate 20, weight 89.1 kg (196 lb 8 oz), SpO2 100 %. Physical Exam  Constitutional: He is oriented to person, place, and time. He appears well-developed and well-nourished. No distress.  HENT:  Head: Normocephalic and atraumatic.  Mouth/Throat: Oropharynx is clear and moist.  Eyes: EOM are normal. Pupils are equal, round, and reactive to light.  Neck: Normal range of motion. Neck supple. No JVD present. No thyromegaly present.  Cardiovascular: Normal rate, regular rhythm and intact distal pulses.   Murmur heard. 3/6 systolic murmur RSB  Respiratory: Effort normal and breath sounds normal. No respiratory distress. He has no wheezes. He has no rales.  GI: Soft. Bowel sounds are normal. He exhibits no distension and no mass. There  is no tenderness.  Musculoskeletal: Normal range of motion. He exhibits no edema.  Lymphadenopathy:    He has no cervical adenopathy.  Neurological: He is alert and oriented to person, place, and time. He has normal strength. No cranial nerve deficit or sensory deficit.  Skin: Skin is warm and dry.  Psychiatric: He has  a normal mood and affect.   Isahia Hollerbach Doublin  Cardiac catheterization  Order# [id number]  Reading physician: Leonie Man, MD Ordering physician: Leonie Man, MD Study date: 07/09/16  Physicians   Panel Physicians Referring Physician Case Authorizing Physician  Leonie Man, MD (Primary)    Procedures   Right/Left Heart Cath and Coronary Angiography  Conclusion     There is severe aortic valve stenosis.  Essentially minimal CAD   Known severe aortic stenosis with heavily calcified valve angiographically. Very powerful aortic valve jet.   Plan: He will be admitted overnight for CT surgical consultation for possible aortic valve replacement surgery. TR band removal per protocol.   Glenetta Hew, M.D., M.S. Interventional Cardiologist   Pager # [phone] Phone # [phone] [address], [place] [address]    Indications   Critical aortic valve stenosis [I35.0 (ICD-10-CM)]  Syncope and collapse Jingyi.Sol (ICD-10-CM)]  Procedural Details/Technique   Technical Details PCP: Wonda Cerise, MD Primary Cardiologist: Lorretta Harp MD Renae Gloss  HPI: Mr. Weldy is a delightful 61 year old fit-appearing widowed Caucasian male (wife died of colon cancer 4 years ago), father of 2 children with no grandchildren who works as an Careers adviser at [employer] . He is accompanied by a friend, Cranston Neighbor. He was referred for evaluation and treatment of severe aortic stenosis. He has no chronic risk factors. He is active and walks 4-5 miles a day. PCP heard a murmur and followed up with a 2-D echocardiogram 04/28/16 that  revealed normal LV size and function, moderate concentric LVH with severe aortic stenosis. The mean gradient was 86 mHg.  He had planned right left heart catheterization for next week, however he presented to [facility] emergency room after syncopal episode today. He is therefore referred for right left heart catheterization as he will likely require aortic valve replacement.  Time Out: Verified patient identification, verified procedure, site/side was marked, verified correct patient position, special equipment/implants available, medications/allergies/relevent history reviewed, required imaging and test results available. Performed.   Access:  RIGHT Radial Artery: 6 Fr sheath -- Seldinger technique using Angiocath Micropuncture Kit - under ultrasound guidance 10 mL radial cocktail IA; 5000 Units IV Heparin Right Brachial/Antecubital Vein: The existing 18-gauge IV was exchanged over a wire for a 6Fr glide sheath  Right Heart Catheterization: 5 Fr Gordy Councilman catheter advanced under fluoroscopy with balloon inflated to the RA, RV, then PCWP-PA for hemodynamic measurement.  Simultaneous FA & PA blood gases checked for SaO2% to calculate FICK CO/CI  Catheter removed completely out of the body with balloon deflated.  Left Heart Catheterization: 5 Fr Catheters advanced or exchanged over a J-wire under direct fluoroscopic guidance into the ascending aorta; TIG 4.0 catheter advanced first.  LV Hemodynamics (LV Gram): Not assessed. Aortic valve was not able to be crossed Left Coronary Artery Cineangiography: TIG 4.0 Catheter  Right Coronary Artery, SVG-RCA & SVG-OM Cineangiography: 6Fr EZ RadR Catheter (unable to fully engage as the catheter was continuously being buffeted by the aortic jet. Nonselective images were adequate for visualization. I did not want to change out for further catheters as he was having increased spasm of the radial and brachial arteries.  Following angiography, the catheter was  removed clearly out of the body over a wire. No complications  Brachial Sheath(s) removed in the PACU holding area with manual pressure for hemostasis.   Radial sheath removed in the Cardiac Catheterization lab with TR Band placed for hemostasis.  TR Band: 1535 Hours; 9 mL air  MEDICATIONS * SQ Lidocaine 3m * Radial Cocktail: 5 mg verapamil in 10 mL NS (in aliquots of 3 mg and 2 mg) * Isovue Contrast: 120 mL * Heparin: 5000 Units * 250 mL NS bolus   Estimated blood loss <50 mL.  During this procedure the patient was administered the following to achieve and maintain moderate conscious sedation: Versed 5 mg, Fentanyl 125 mcg, while the patient's heart rate, blood pressure, and oxygen saturation were continuously monitored. The period of conscious sedation was 64 minutes, of which I was present face-to-face 100% of this time.    Complications   Complications documented before study signed (07/09/2016 4:05 PM EST)    No complications were associated with this study.  Documented by DLeonie Man MD - 07/09/2016 4:03 PM EST    Coronary Findings   Dominance: Right  Left Main  Vessel is large.  Left Anterior Descending  Dist LAD lesion, 20% stenosed. The lesion is tubular.  First Septal Branch  Vessel is small in size.  Second Septal Branch  Vessel is moderate in size. Major perforating trunk.  Third Diagonal Branch  Vessel is small in size.  Third Septal Branch  Vessel is small in size.  Ramus Intermedius  Vessel is large. Has a small branch from the mid vessel after giving rise to a moderate caliber more proximal branch  Ost Ramus to Ramus lesion, 30% stenosed. The lesion is discrete and smooth.  Lateral Ramus Intermedius  Vessel is moderate in size.  Left Circumflex  First Obtuse Marginal Branch  Vessel is small in size.  Second Obtuse Marginal Branch  Vessel is small in size.  Third Obtuse Marginal Branch  Vessel is moderate in size. Vessel is angiographically  normal. Courses as Left Posterolateral  Lateral Third Obtuse Marginal Branch  Vessel is small in size.  Right Coronary Artery  Vessel is large. Vessel is angiographically normal. High Anterior Takeoff - unable to fully engage due to AS jet  Ost RCA lesion, 30% stenosed. The lesion is tubular and smooth.  Acute Marginal Branch  Vessel is moderate in size.  Right Posterior Descending Artery  Vessel is moderate in size.  Inferior Septal  Vessel is small in size.  Right Posterior Atrioventricular Branch  Vessel is moderate in size.  First Right Posterolateral  Vessel is moderate in size.  Second Right Posterolateral  Vessel is small in size.  Right Heart   Right Heart Pressures Hemodynamic findings consistent with aortic valve stenosis. PA pressure-mean: 34/13/23 mmHg PCWP: 19/15/12 mmHg Not recorded LV pressure not recorded AoP/MAP: 98/56/72 mmHg. AO sat 99%. PA sat average 74%. Cardiac output/index by Fick: 5.59, 2.66 =normal    Right Atrium Right atrial pressure 10/7/4 mmHg    Right Ventricle RV pressure/RVEDP 36/0/10 mmHg    Left Heart   Left Ventricle Not Assessed    Aortic Valve There is severe aortic valve stenosis. Severe calcification noted with powerful AS Jet.    Coronary Diagrams   Diagnostic Diagram     Implants     No implant documentation for this case.  PACS Images   Show images for Cardiac catheterization   Link to Procedure Log   Procedure Log    Hemo Data   Flowsheet Row Most Recent Value  Fick Cardiac Output 5.59 L/min  Fick Cardiac Output Index 2.66 (L/min)/BSA  RA A Wave 10 mmHg  RA V Wave 7 mmHg  RA Mean 4 mmHg  RV  Systolic Pressure 36 mmHg  RV Diastolic Pressure 0 mmHg  RV EDP 10 mmHg  PA Systolic Pressure 34 mmHg  PA Diastolic Pressure 13 mmHg  PA Mean 23 mmHg  PW A Wave 19 mmHg  PW V Wave 15 mmHg  PW Mean 12 mmHg  AO Systolic Pressure 98 mmHg  AO Diastolic Pressure 56 mmHg  AO Mean 72 mmHg  QP/QS 1  TPVR Index 8.64 HRUI    TSVR Index 27.06 HRUI  PVR SVR Ratio 0.16  TPVR/TSVR Ratio 0.32   Risk Model and Variables - STS Adult Cardiac Surgery Database Version 2.81 RISK SCORES About the STS Risk Calculator Procedure: AV Replacement  Risk of Mortality: 0.858%  Morbidity or Mortality: 9.605%  Long Length of Stay: 2.756%  Short Length of Stay: 60.116%  Permanent Stroke: 0.689%  Prolonged Ventilation: 4.555%  DSW Infection: 0.15%  Renal Failure: 1.818%  Reoperation: 5.237%   Assessment/Plan:  This gentleman has stage D severe, symptomatic aortic stenosis presenting with some exertional fatigue, 2 prior episodes of dizziness over the past 6 months and now a syncopal episode. I don't have access to the official echo report yet but his peak velocity across the aortic valve is reported to be 5.4 m/sec with a mean gradient of 67 mm Hg. His aortic valve is very calcified on the cine. He has no significant coronary artery disease. I agree that AVR is indicated in the patient. He is interested in TAVR but his STS PROM is only 0.858% so he is in the very low risk category and not a TAVR candidate. He is only 61 years old and TAVR would not be in his best interest from a valve longevity standpoint either. I discussed surgical AVR, the option of possibly have a minimally invasive AVR, mechanical vs pericardial tissue valves and answered all of his questions. He would like to discuss the possibility of mini AVR and I told him that I do not do that procedure but will have Dr. Roxy Manns see him Monday to discuss that further. He knows that he may not be a candidate for that approach. He will probably need a gated cardiac CT and CTA of the abdomen and plevis to evaluate him for mini AVR but will wait to order those until I can talk with Dr. Roxy Manns. With his syncopal episode and critical AS I think he should remain in the hospital until surgery.  I spent 60 minutes performing this consultation and > 50% of this time was spent face to face  counseling and coordinating the care of this patient's critical aortic stenosis.  Gaye Pollack, MD 07/09/2016

## 2016-07-11 ENCOUNTER — Encounter (HOSPITAL_COMMUNITY): Payer: Self-pay | Admitting: Cardiology

## 2016-07-11 NOTE — Progress Notes (Signed)
Patient ambulated in hallway independently today will monitor patient. Caressa Scearce Jessup RN  

## 2016-07-11 NOTE — Progress Notes (Signed)
Patient Name: Jerry Odonnell Date of Encounter: 07/11/2016  [facility]  Hospital Problem List     Principal Problem:   Syncope and collapse Active Problems:   Critical aortic valve stenosis   Hyperlipidemia   Thrombocytopenia (HCC)     Subjective   Feels well overnight.  Complains of production of white phlegm.  He is to talk to Dr. Cornelius Moraswen tomorrow about non-sternotomy approach.  He mentions that [facility] with [facility] and told that he had a small lesion and a noncritical area on imaging.  No issues since then.   Inpatient Medications    Scheduled Meds: . aspirin EC  81 mg Oral Daily  . atorvastatin  10 mg Oral q1800  . heparin  5,000 Units Subcutaneous Q8H  . sodium chloride flush  3 mL Intravenous Q12H   Continuous Infusions:  PRN Meds: sodium chloride, acetaminophen, ALPRAZolam, nitroGLYCERIN, ondansetron (ZOFRAN) IV, sodium chloride flush, zolpidem   Vital Signs    Vitals:   07/10/16 1212 07/10/16 2000 07/10/16 2035 07/11/16 0613  BP: (!) 145/55  118/64 (!) 94/44  Pulse: 95  66 (!) 58  Resp: 18  18 18   Temp: 97.7 F (36.5 C)  98.3 F (36.8 C) 98 F (36.7 C)  TempSrc: Oral  Oral Oral  SpO2: 100%  99% 98%  Weight:      Height:  5\' 11"  (1.803 m)      Intake/Output Summary (Last 24 hours) at 07/11/16 0834 Last data filed at 07/11/16 [id number]  Gross per 24 hour  Intake              240 ml  Output             2850 ml  Net            -2610 ml   Filed Weights   07/10/16 0410  Weight: 89.1 kg (196 lb 8 oz)    Physical Exam    GEN: Pleasant male in no acute distress Neck: Supple, no JVD, carotid bruits, or masses. Cardiac: RRR, III/VI harsh systolic murmur RUSB radiating to the neck, no rubs, or gallops. No clubbing, cyanosis, edema.  Radials/DP/PT 2+ and equal bilaterally. Right upper extremity mildly swollen with right radial cath site without bleeding, erythema, hematoma. Radial pulse  2+. Respiratory:  Respirations regular and unlabored, clear to auscultation bilaterally. Psych: AAOx3.  Normal affect.  Labs    CBC  Recent Labs  07/09/16 0922 07/10/16 0838  WBC 8.5 8.3  HGB 14.7 15.4  HCT 42.6 45.1  MCV 92.0 93.2  PLT 136* 144*   Basic Metabolic Panel  Recent Labs  07/09/16 0922 07/09/16 1837  NA 138  --   K 4.2  --   CL 107  --   CO2 23  --   GLUCOSE 126*  --   BUN 17  --   CREATININE 0.99  --   CALCIUM 9.1  --   MG  --  2.1   Liver Function Tests  Recent Labs  07/09/16 1837  AST 28  ALT 42  ALKPHOS 70  BILITOT 1.4*  PROT 5.8*  ALBUMIN 3.8   No results for input(s): LIPASE, AMYLASE in the last 72 hours. Cardiac Enzymes  Recent Labs  07/09/16 1301 07/09/16 1837 07/09/16 2307  TROPONINI 0.22* 0.15* 0.28*   Hemoglobin A1C  Recent Labs  07/09/16 1301  HGBA1C 5.4   Thyroid Function Tests  Recent Labs  07/09/16  1301  TSH 0.415    Telemetry    Sinus rhythm 60's to 70's bpm - Personally Reviewed  Cardiac Studies   [facility] 07/09/16: Conclusion     There is severe aortic valve stenosis.  Essentially minimal CAD   Known severe aortic stenosis with heavily calcified valve angiographically. Very powerful aortic valve jet.   Plan: He will be admitted overnight for CT surgical consultation for possible aortic valve replacement surgery. TR band removal per protocol.   Right Heart   Right Heart Pressures Hemodynamic findings consistent with aortic valve stenosis. PA pressure-mean: 34/13/23 mmHg PCWP: 19/15/12 mmHg Not recorded LV pressure not recorded AoP/MAP: 98/56/72 mmHg. AO sat 99%. PA sat average 74%. Cardiac output/index by Fick: 5.59, 2.66 =normal    Right Atrium Right atrial pressure 10/7/4 mmHg    Right Ventricle RV pressure/RVEDP 36/0/10 mmHg     Left Heart   Left Ventricle Not Assessed    Aortic Valve There is severe aortic valve stenosis. Severe calcification noted with powerful AS Jet.       Patient Profile     61 y.o. male with history of severe aortic stenosis, syncope, HLD, and thrombocytopenia who presented to [facility] with syncopal episode.   Assessment & Plan    1. Syncope due to severe aortic stenosis: Awaiting evaluation by Dr. Cornelius Moras for many aVR procedure-she is still deciding what type of valve he would like to have     2. Nonobstructive CAD:   3. Thrombocytopenia:-Mild  W. Viann Fish, Montez Hageman. MD [facility] 8:39 AM 07/11/2016

## 2016-07-12 ENCOUNTER — Inpatient Hospital Stay (HOSPITAL_COMMUNITY): Payer: BLUE CROSS/BLUE SHIELD

## 2016-07-12 ENCOUNTER — Encounter (HOSPITAL_COMMUNITY): Payer: Self-pay | Admitting: Thoracic Surgery (Cardiothoracic Vascular Surgery)

## 2016-07-12 ENCOUNTER — Other Ambulatory Visit: Payer: Self-pay | Admitting: *Deleted

## 2016-07-12 DIAGNOSIS — Z01810 Encounter for preprocedural cardiovascular examination: Secondary | ICD-10-CM

## 2016-07-12 DIAGNOSIS — I350 Nonrheumatic aortic (valve) stenosis: Secondary | ICD-10-CM

## 2016-07-12 LAB — VAS US DOPPLER PRE CABG
LCCADDIAS: -18 cm/s
LCCADSYS: -84 cm/s
LCCAPSYS: 98 cm/s
LEFT ECA DIAS: -19 cm/s
LEFT VERTEBRAL DIAS: 19 cm/s
LICADDIAS: -34 cm/s
Left CCA prox dias: 23 cm/s
Left ICA dist sys: -98 cm/s
Left ICA prox dias: -24 cm/s
Left ICA prox sys: -81 cm/s
RIGHT ECA DIAS: -19 cm/s
RIGHT VERTEBRAL DIAS: -13 cm/s
Right cca dist sys: -87 cm/s

## 2016-07-12 LAB — PULMONARY FUNCTION TEST
DL/VA % pred: 78 %
DL/VA: 3.69 ml/min/mmHg/L
DLCO COR % PRED: 74 %
DLCO UNC % PRED: 76 %
DLCO UNC: 26.18 ml/min/mmHg
DLCO cor: 25.62 ml/min/mmHg
FEF 25-75 Post: 4.52 L/sec
FEF 25-75 Pre: 2.47 L/sec
FEF2575-%Change-Post: 83 %
FEF2575-%Pred-Post: 152 %
FEF2575-%Pred-Pre: 83 %
FEV1-%CHANGE-POST: 9 %
FEV1-%PRED-POST: 98 %
FEV1-%Pred-Pre: 89 %
FEV1-Post: 3.65 L
FEV1-Pre: 3.33 L
FEV1FVC-%Change-Post: 5 %
FEV1FVC-%Pred-Pre: 101 %
FEV6-%Change-Post: 5 %
FEV6-%PRED-POST: 95 %
FEV6-%Pred-Pre: 90 %
FEV6-Post: 4.5 L
FEV6-Pre: 4.27 L
FEV6FVC-%CHANGE-POST: 0 %
FEV6FVC-%Pred-Post: 103 %
FEV6FVC-%Pred-Pre: 102 %
FVC-%CHANGE-POST: 4 %
FVC-%Pred-Post: 92 %
FVC-%Pred-Pre: 88 %
FVC-PRE: 4.36 L
FVC-Post: 4.56 L
POST FEV1/FVC RATIO: 80 %
PRE FEV6/FVC RATIO: 98 %
Post FEV6/FVC ratio: 99 %
Pre FEV1/FVC ratio: 76 %
RV % pred: 84 %
RV: 2.03 L
TLC % pred: 87 %
TLC: 6.39 L

## 2016-07-12 LAB — ECHOCARDIOGRAM COMPLETE
Height: 71 in
WEIGHTICAEL: 3144 [oz_av]

## 2016-07-12 MED ORDER — ALBUTEROL SULFATE (2.5 MG/3ML) 0.083% IN NEBU
2.5000 mg | INHALATION_SOLUTION | Freq: Once | RESPIRATORY_TRACT | Status: AC
  Administered 2016-07-12: 2.5 mg via RESPIRATORY_TRACT

## 2016-07-12 MED ORDER — IOPAMIDOL (ISOVUE-370) INJECTION 76%
INTRAVENOUS | Status: AC
  Administered 2016-07-12: 50 mL
  Filled 2016-07-12: qty 100

## 2016-07-12 MED ORDER — IOPAMIDOL (ISOVUE-370) INJECTION 76%
INTRAVENOUS | Status: AC
  Administered 2016-07-12: 100 mL
  Filled 2016-07-12: qty 50

## 2016-07-12 NOTE — Progress Notes (Signed)
  Echocardiogram 2D Echocardiogram has been performed.  Neriyah Cercone 07/12/2016, 11:03 AM

## 2016-07-12 NOTE — Progress Notes (Signed)
CARDIAC REHAB PHASE I   Per RN, pt is ambulating independently, currently down for PFTs. RN gave pt cardiac surgery book. Will follow up tomorrow.   Joylene GrapesEmily C Aspyn Warnke, RN, BSN 07/12/2016 2:16 PM

## 2016-07-12 NOTE — Progress Notes (Signed)
Progress Note  Patient Name: Jerry Odonnell Date of Encounter: 07/12/2016  Primary Cardiologist:   Subjective   The patient feels well today.   Inpatient Medications    Scheduled Meds: . aspirin EC  81 mg Oral Daily  . atorvastatin  10 mg Oral q1800  . heparin  5,000 Units Subcutaneous Q8H  . sodium chloride flush  3 mL Intravenous Q12H   Continuous Infusions:  PRN Meds: sodium chloride, acetaminophen, ALPRAZolam, nitroGLYCERIN, ondansetron (ZOFRAN) IV, sodium chloride flush, zolpidem   Vital Signs    Vitals:   07/11/16 0613 07/11/16 1333 07/11/16 2008 07/12/16 0533  BP: (!) 94/44 125/63 132/62 116/61  Pulse: (!) 58 65 62 (!) 56  Resp: 18 18 18 18   Temp: 98 F (36.7 C) 98.3 F (36.8 C) 98.4 F (36.9 C) 97.9 F (36.6 C)  TempSrc: Oral Oral Oral Oral  SpO2: 98% 98% 99% 99%  Weight:      Height:        Intake/Output Summary (Last 24 hours) at 07/12/16 [id number] Last data filed at 07/11/16 2200  Gross per 24 hour  Intake              720 ml  Output             2151 ml  Net            -1431 ml   Filed Weights   07/10/16 0410  Weight: 196 lb 8 oz (89.1 kg)    Telemetry    There is normal sinus rhythm with sinus bradycardia. - Personally Reviewed  ECG      Physical Exam   The patient is oriented to person time and place. Affect is normal. He is awaiting further input today from Jerry Odonnell about whether he is a candidate for minimally invasive aortic valve replacement.  Labs    Chemistry Recent Labs Lab 07/09/16 0922 07/09/16 1837  NA 138  --   K 4.2  --   CL 107  --   CO2 23  --   GLUCOSE 126*  --   BUN 17  --   CREATININE 0.99  --   CALCIUM 9.1  --   PROT  --  5.8*  ALBUMIN  --  3.8  AST  --  28  ALT  --  42  ALKPHOS  --  70  BILITOT  --  1.4*  GFRNONAA >60  --   GFRAA >60  --   ANIONGAP 8  --      Hematology Recent Labs Lab 07/09/16 0922 07/10/16 0838  WBC 8.5 8.3  RBC 4.63 4.84  HGB 14.7 15.4  HCT 42.6 45.1  MCV 92.0 93.2   MCH 31.7 31.8  MCHC 34.5 34.1  RDW 13.0 13.1  PLT 136* 144*    Cardiac Enzymes Recent Labs Lab 07/09/16 1001 07/09/16 1301 07/09/16 1837 07/09/16 2307  TROPONINI 0.08* 0.22* 0.15* 0.28*   No results for input(s): TROPIPOC in the last 168 hours.   BNPNo results for input(s): BNP, PROBNP in the last 168 hours.   DDimer No results for input(s): DDIMER in the last 168 hours.   Radiology    No results found.  Cardiac Studies     Patient Profile     61 y.o. male with history of severe aortic stenosis, syncope, HLD, and thrombocytopenia [facility]  Assessment & Plan      Syncope and collapse  He had a significant episode related to his aortic stenosis. He is being watched carefully in the hospital before his aortic valve surgery.    Critical aortic valve stenosis     The patient needs aortic valve surgery. There has been careful assessment by Jerry Odonnell. The patient will be seen today by Jerry Odonnell to decide if the patient is a candidate for minimally invasive aortic valve replacement.    Hyperlipidemia   Thrombocytopenia (HCC)  Signed, Willa Rough, MD  07/12/2016, 6:37 AM

## 2016-07-12 NOTE — Consult Note (Signed)
[address]       [place] [address]             [phone]          CARDIOTHORACIC SURGERY CONSULTATION REPORT  PCP is KALISH, Christian Mate, MD Referring Provider is Gaye Pollack, MD Primary Cardiologist is BERRY, Pearletha Forge, MD  Reason for consultation:  Critical aortic stenosis  HPI:  Patient is a 61 year old male with recently discovered critical aortic stenosis who was admitted to the hospital following a syncopal event and has been referred for second surgical opinion to discuss treatment options for management of aortic stenosis. The patient states that he has known he had a heart murmur for many years. Approximately 14 years ago he was referred for cardiology evaluation because of an abnormal EKG and underwent a stress test. An echocardiogram was apparently not performed at that time. The patient was felt to be low risk and no further etiology follow-up was arranged. Last fall the patient returned to see his primary care physician for routine examination. At that time the patient was told that the character of his heart murmur had changed and a routine echocardiogram was ordered. The patient underwent transthoracic echocardiogram at [place] in [facility] on 04/28/2016. By report this echocardiogram demonstrated the presence of critical aortic stenosis with peak velocity across the aortic valve measured 5.4 m/s corresponding to mean transvalvular gradient estimated 86 mmHg. Left ventricular systolic function remained normal with ejection fraction estimated 60%. The patient discuss these findings with Dr. Atilano Median in [facility] and ultimately decided to seek a second opinion. He was evaluated last week by Dr. Gwenlyn Found and plans were made for elective diagnostic cardiac catheterization later this week.  However, on February 16 the patient was walking with a friend and suffered a syncopal episode. He states that they were walking at a ordinary patient  up a hill and he began to feel dizzy. They decided to stop and turned around but he began to feel worse. He ultimately sat down on the grass and lost consciousness for over a minute. EMS was summoned and he was admitted directly to the hospital. Diagnostic cardiac catheterization was performed demonstrating minimal nonobstructive coronary artery disease and normal right-sided pressures. No attempt was made to cross the aortic valve. Cardiothoracic surgical consultation was requested and the patient was evaluated by Dr. Cyndia Bent. Surgical options were discussed and a second opinion requested to discuss the possibility of minimally invasive approach for surgery.  The patient is a widower and lives alone in [place]. He works as a Freight forwarder for [employer].  He has 2 adult children and he remains physically quite active. He walks every day and reports no symptoms of exertional shortness of breath or chest tightness. He admits that both his children and his close friend has noticed that he seems to walks more slowly than he had in the past and that he gets tired more easily than he used to. He has had a few other dizzy spells in the past including an episode of prolonged vertigo last August for which he was evaluated in an emergency room in [facility]. Initially there was concern that he may have had a stroke but CT scan of the brain did not reveal any signs of a stroke. He was noted to have a heart murmur at that time but no echocardiogram was performed. He was told that his vertigo was probably related to an inner ear  problem.  The patient denies any symptoms of significant shortness of breath, orthopnea, or lower extremity edema.  Past Medical History:  Diagnosis Date  . Aortic stenosis   . Hyperlipidemia   . Personal history of TIA (transient ischemic attack)    Admission in 2017 to [facility] with reported small lesion seen on imaging  . Syncope and collapse 07/09/2016  . Thrombocytopenia ([place])      Past Surgical History:  Procedure Laterality Date  . POSTERIOR CERVICAL LAMINECTOMY  2013  . RIGHT/LEFT HEART CATH AND CORONARY ANGIOGRAPHY N/A 07/09/2016   Procedure: Right/Left Heart Cath and Coronary Angiography;  Surgeon: Jerry Man, MD;  Location: [facility];  Service: Cardiovascular;  Laterality: N/A;    Family History  Problem Relation Age of Onset  . Healthy Brother     Social History   Social History  . Marital status: Widowed    Spouse name: N/A  . Number of children: N/A  . Years of education: N/A   Occupational History  . Not on file.   Social History Main Topics  . Smoking status: Never Smoker  . Smokeless tobacco: Never Used  . Alcohol use 4.2 oz/week    7 Glasses of wine per week     Comment: approximately 1 drink per day  . Drug use: No  . Sexual activity: Not on file   Other Topics Concern  . Not on file   Social History Narrative  . No narrative on file    Prior to Admission medications   Medication Sig Start Date End Date Taking? Authorizing Provider  aspirin EC 81 MG tablet Take 81 mg by mouth daily. 01/17/16  Yes Historical Provider, MD  atorvastatin (LIPITOR) 10 MG tablet Take 10 mg by mouth daily. 03/23/16  Yes Historical Provider, MD  CIALIS 5 MG tablet Take 5-10 mg by mouth daily as needed.  06/10/16  Yes Historical Provider, MD  dextromethorphan (DELSYM) 30 MG/5ML liquid Take 60 mg by mouth 2 (two) times daily.   Yes Historical Provider, MD  EPINEPHrine (EPIPEN 2-PAK) 0.3 mg/0.3 mL IJ SOAJ injection Inject 0.3 mLs into the muscle as directed. 03/23/16  Yes Historical Provider, MD  fluticasone (FLONASE) 50 MCG/ACT nasal spray Place 1 spray into the nose as directed. 05/12/16  Yes Historical Provider, MD  meclizine (ANTIVERT) 25 MG tablet Take 25 mg by mouth as directed. 01/17/16  Yes Historical Provider, MD    Current Facility-Administered Medications  Medication Dose Route Frequency Provider Last Rate Last Dose  . 0.9 %   sodium chloride infusion  250 mL Intravenous PRN Jerry Man, MD      . acetaminophen (TYLENOL) tablet 650 mg  650 mg Oral Q4H PRN Isaiah Serge, NP      . ALPRAZolam Duanne Moron) tablet 0.25 mg  0.25 mg Oral BID PRN Isaiah Serge, NP      . aspirin EC tablet 81 mg  81 mg Oral Daily Isaiah Serge, NP   81 mg at 07/11/16 0929  . atorvastatin (LIPITOR) tablet 10 mg  10 mg Oral q1800 Isaiah Serge, NP   10 mg at 07/11/16 1801  . heparin injection 5,000 Units  5,000 Units Subcutaneous Q8H Jerry Man, MD   5,000 Units at 07/12/16 [phone]  . nitroGLYCERIN (NITROSTAT) SL tablet 0.4 mg  0.4 mg Sublingual Q5 Min x 3 PRN Isaiah Serge, NP      . ondansetron [facility]) injection 4 mg  4 mg Intravenous Q6H  PRN Isaiah Serge, NP      . sodium chloride flush (NS) 0.9 % injection 3 mL  3 mL Intravenous Q12H Jerry Man, MD   3 mL at 07/11/16 2157  . sodium chloride flush (NS) 0.9 % injection 3 mL  3 mL Intravenous PRN Jerry Man, MD   3 mL at 07/10/16 2144  . zolpidem (AMBIEN) tablet 5 mg  5 mg Oral QHS PRN,MR X 1 Isaiah Serge, NP        Allergies  Allergen Reactions  . Bee Venom Swelling      Review of Systems:   General:  normal appetite, decreased energy, no weight gain, slight intentional weight loss, no fever  Cardiac:  no chest pain with exertion, no chest pain at rest, no SOB with exertion, no resting SOB, no PND, no orthopnea, no palpitations, no arrhythmia, no atrial fibrillation, no LE edema, + dizzy spells, + syncope  Respiratory:  no shortness of breath, no home oxygen, no productive cough, no dry cough, no bronchitis, no wheezing, no hemoptysis, no asthma, no pain with inspiration or cough, no sleep apnea, no CPAP at night  GI:   no difficulty swallowing, no reflux, no frequent heartburn, no hiatal hernia, no abdominal pain, no constipation, no diarrhea, no hematochezia, no hematemesis, no melena  GU:   no dysuria,  no frequency, no urinary tract infection, no hematuria, no  enlarged prostate, no kidney stones, no kidney disease  Vascular:  no pain suggestive of claudication, no pain in feet, no leg cramps, no varicose veins, no DVT, no non-healing foot ulcer  Neuro:   no stroke, ? previous TIA's, no seizures, no headaches, no temporary blindness one eye,  no slurred speech, no peripheral neuropathy, no chronic pain, no instability of gait, no memory/cognitive dysfunction  Musculoskeletal: no arthritis, no joint swelling, no myalgias, no difficulty walking, normal mobility   Skin:   no rash, no itching, no skin infections, no pressure sores or ulcerations  Psych:   no anxiety, no depression, no nervousness, no unusual recent stress  Eyes:   no blurry vision, occasional floaters, no recent vision changes, + wears glasses or contacts  ENT:   no hearing loss, no loose or painful teeth, no dentures, last saw dentist within 6 months  Hematologic:  no easy bruising, no abnormal bleeding, no clotting disorder, no frequent epistaxis  Endocrine:  no diabetes, does not check CBG's at home     Physical Exam:   BP 116/61 (BP Location: Right Arm)   Pulse (!) 56   Temp 97.9 F (36.6 C) (Oral)   Resp 18   Ht '5\' 11"'  (1.803 m)   Wt 196 lb 8 oz (89.1 kg)   SpO2 99%   BMI 27.41 kg/m   General:    well-appearing  HEENT:  Unremarkable   Neck:   no JVD, no bruits, no adenopathy   Chest:   clear to auscultation, symmetrical breath sounds, no wheezes, no rhonchi   CV:   RRR, grade IV/VI late-peaking crescendo/decrescendo systolic murmur   Abdomen:  soft, non-tender, no masses   Extremities:  warm, well-perfused, pulses diminished but palpable, no lower extremity edema  Rectal/GU  Deferred  Neuro:   Grossly non-focal and symmetrical throughout  Skin:   Clean and dry, no rashes, no breakdown  Diagnostic Tests:  TRANSTHORACIC ECHOCARDIOGRAM  Report from transthoracic echocardiogram performed 04/28/2016 at [place] [facility] is reviewed. Images from  this examination are not  currently available for review.  By report there was severe aortic stenosis.  By report the aortic valve appeared trileaflet with leaflet thickening, calcification, and restricted leaflet mobility. Peak velocity across the aortic valve measured greater than 5.4 m/s corresponding to mean transvalvular gradient estimated 86 mmHg. Left ventricular systolic function was reportedly normal with ejection fraction estimated 60%. No other significant abnormalities were noted.    Right/Left Heart Cath and Coronary Angiography  Conclusion     There is severe aortic valve stenosis.  Essentially minimal CAD   Known severe aortic stenosis with heavily calcified valve angiographically. Very powerful aortic valve jet.   Plan: He will be admitted overnight for CT surgical consultation for possible aortic valve replacement surgery. TR band removal per protocol.   Glenetta Hew, M.D., M.S. Interventional Cardiologist   Pager # [phone] Phone # [phone] [address] [place], [place] [address]    Indications   Critical aortic valve stenosis [I35.0 (ICD-10-CM)]  Syncope and collapse Jingyi.Sol (ICD-10-CM)]  Procedural Details/Technique   Technical Details PCP: Wonda Cerise, MD Primary Cardiologist: Lorretta Harp MD Renae Gloss  HPI: Mr. Dubs is a delightful 61 year old fit-appearing widowed Caucasian male (wife died of colon cancer 4 years ago), father of 2 children with no grandchildren who works as an Careers adviser at [employer] . He is accompanied by a friend, Cranston Neighbor. He was referred for evaluation and treatment of severe aortic stenosis. He has no chronic risk factors. He is active and walks 4-5 miles a day. PCP heard a murmur and followed up with a 2-D echocardiogram 04/28/16 that revealed normal LV size and function, moderate concentric LVH with severe aortic stenosis. The mean gradient was 86 mHg.  He had planned right left  heart catheterization for next week, however he presented to [facility] emergency room after syncopal episode today. He is therefore referred for right left heart catheterization as he will likely require aortic valve replacement.  Time Out: Verified patient identification, verified procedure, site/side was marked, verified correct patient position, special equipment/implants available, medications/allergies/relevent history reviewed, required imaging and test results available. Performed.   Access:  RIGHT Radial Artery: 6 Fr sheath -- Seldinger technique using Angiocath Micropuncture Kit - under ultrasound guidance 10 mL radial cocktail IA; 5000 Units IV Heparin Right Brachial/Antecubital Vein: The existing 18-gauge IV was exchanged over a wire for a 6Fr glide sheath  Right Heart Catheterization: 5 Fr Gordy Councilman catheter advanced under fluoroscopy with balloon inflated to the RA, RV, then PCWP-PA for hemodynamic measurement.  Simultaneous FA & PA blood gases checked for SaO2% to calculate FICK CO/CI  Catheter removed completely out of the body with balloon deflated.  Left Heart Catheterization: 5 Fr Catheters advanced or exchanged over a J-wire under direct fluoroscopic guidance into the ascending aorta; TIG 4.0 catheter advanced first.  LV Hemodynamics (LV Gram): Not assessed. Aortic valve was not able to be crossed Left Coronary Artery Cineangiography: TIG 4.0 Catheter  Right Coronary Artery, SVG-RCA & SVG-OM Cineangiography: 6Fr EZ RadR Catheter (unable to fully engage as the catheter was continuously being buffeted by the aortic jet. Nonselective images were adequate for visualization. I did not want to change out for further catheters as he was having increased spasm of the radial and brachial arteries.  Following angiography, the catheter was removed clearly out of the body over a wire. No complications  Brachial Sheath(s) removed in the PACU holding area with manual pressure for  hemostasis.   Radial sheath  removed in the Cardiac Catheterization lab with TR Band placed for hemostasis.  TR Band: 1535 Hours; 9 mL air  MEDICATIONS * SQ Lidocaine 64m * Radial Cocktail: 5 mg verapamil in 10 mL NS (in aliquots of 3 mg and 2 mg) * Isovue Contrast: 120 mL * Heparin: 5000 Units * 250 mL NS bolus   Estimated blood loss <50 mL.  During this procedure the patient was administered the following to achieve and maintain moderate conscious sedation: Versed 5 mg, Fentanyl 125 mcg, while the patient's heart rate, blood pressure, and oxygen saturation were continuously monitored. The period of conscious sedation was 64 minutes, of which I was present face-to-face 100% of this time.    Complications   Complications documented before study signed (07/09/2016 4:05 PM EST)    No complications were associated with this study.  Documented by DLeonie Man MD - 07/09/2016 4:03 PM EST    Coronary Findings   Dominance: Right  Left Main  Vessel is large.  Left Anterior Descending  Dist LAD lesion, 20% stenosed. The lesion is tubular.  First Septal Branch  Vessel is small in size.  Second Septal Branch  Vessel is moderate in size. Major perforating trunk.  Third Diagonal Branch  Vessel is small in size.  Third Septal Branch  Vessel is small in size.  Ramus Intermedius  Vessel is large. Has a small branch from the mid vessel after giving rise to a moderate caliber more proximal branch  Ost Ramus to Ramus lesion, 30% stenosed. The lesion is discrete and smooth.  Lateral Ramus Intermedius  Vessel is moderate in size.  Left Circumflex  First Obtuse Marginal Branch  Vessel is small in size.  Second Obtuse Marginal Branch  Vessel is small in size.  Third Obtuse Marginal Branch  Vessel is moderate in size. Vessel is angiographically normal. Courses as Left Posterolateral  Lateral Third Obtuse Marginal Branch  Vessel is small in size.  Right Coronary Artery  Vessel is  large. Vessel is angiographically normal. High Anterior Takeoff - unable to fully engage due to AS jet  Ost RCA lesion, 30% stenosed. The lesion is tubular and smooth.  Acute Marginal Branch  Vessel is moderate in size.  Right Posterior Descending Artery  Vessel is moderate in size.  Inferior Septal  Vessel is small in size.  Right Posterior Atrioventricular Branch  Vessel is moderate in size.  First Right Posterolateral  Vessel is moderate in size.  Second Right Posterolateral  Vessel is small in size.  Right Heart   Right Heart Pressures Hemodynamic findings consistent with aortic valve stenosis. PA pressure-mean: 34/13/23 mmHg PCWP: 19/15/12 mmHg Not recorded LV pressure not recorded AoP/MAP: 98/56/72 mmHg. AO sat 99%. PA sat average 74%. Cardiac output/index by Fick: 5.59, 2.66 =normal    Right Atrium Right atrial pressure 10/7/4 mmHg    Right Ventricle RV pressure/RVEDP 36/0/10 mmHg    Left Heart   Left Ventricle Not Assessed    Aortic Valve There is severe aortic valve stenosis. Severe calcification noted with powerful AS Jet.    Coronary Diagrams   Diagnostic Diagram     Implants     No implant documentation for this case.  PACS Images   Show images for Cardiac catheterization   Link to Procedure Log   Procedure Log    Hemo Data   Flowsheet Row Most Recent Value  Fick Cardiac Output 5.59 L/min  Fick Cardiac Output Index 2.66 (L/min)/BSA  RA A Wave 10 mmHg  RA V Wave 7 mmHg  RA Mean 4 mmHg  RV Systolic Pressure 36 mmHg  RV Diastolic Pressure 0 mmHg  RV EDP 10 mmHg  PA Systolic Pressure 34 mmHg  PA Diastolic Pressure 13 mmHg  PA Mean 23 mmHg  PW A Wave 19 mmHg  PW V Wave 15 mmHg  PW Mean 12 mmHg  AO Systolic Pressure 98 mmHg  AO Diastolic Pressure 56 mmHg  AO Mean 72 mmHg  QP/QS 1  TPVR Index 8.64 HRUI  TSVR Index 27.06 HRUI  PVR SVR Ratio 0.16  TPVR/TSVR Ratio 0.32      Impression:  Patient has stage D severe symptomatic aortic  stenosis. He presents with a frank syncopal episode that occurred following relatively modest physical exertion. He has had several previous dizzy spells and he admits to some gradual decline in his exercise tolerance. By report, previous transthoracic echocardiogram performed 2 months ago revealed findings consistent with essentially critical aortic stenosis. I have personally reviewed the patient's diagnostic cardiac catheterization which reveals very mild nonobstructive coronary artery disease and normal right-sided pressures. There is calcification involving the aortic valve leaflets but transvalvular gradient was not assessed at catheterization.  I agree the patient needs aortic valve replacement and under the circumstances I favor proceeding with surgery during this hospitalization.  Risks associated with conventional aortic valve replacement would be quite low, and given the fact that the patient remains relatively young and free of other significant comorbid medical conditions he would not be considered candidate for transcatheter aortic valve replacement.  He appears to be relatively good candidate for minimally invasive approach for surgery.    Plan:  The patient was counseled at length regarding treatment alternatives for management of severe aortic stenosis including continued medical therapy versus proceeding with aortic valve replacement in the near future.  The natural history of aortic stenosis was reviewed, as was long term prognosis with medical therapy alone.  Surgical options were discussed at length including conventional surgical aortic valve replacement through either a full median sternotomy or using minimally invasive techniques.  Other alternatives including transcatheter aortic valve replacement, patch enlargement of the aortic root, stentless porcine aortic root replacement, valve repair, the Ross autograft procedure, and homograft aortic root replacement were discussed.   The  patient is very interested in considering aortic valve replacement using minimally invasive techniques. As a next step we will obtain a follow-up echocardiogram to make sure that there have been no significant changes since the patient's previous echocardiogram 2 months ago. In addition, we will specifically look at the anatomical characteristics of the patient's aortic valve including the size of the aortic root. This will be further evaluated using cardiac gated CT angiography to better assess the size of the aortic root and the feasibility of possible minimally invasive approach for surgery. We will tentatively plan for surgery on Wednesday, 07/14/2016 providing that his echocardiogram and CT angiograms can be completed promptly.  I spent in excess of 120 minutes during the conduct of this hospital consultation and >50% of this time involved direct face-to-face encounter for counseling and/or coordination of the patient's care.    Jerry Gu. Roxy Manns, MD 07/12/2016 8:59 AM

## 2016-07-12 NOTE — Progress Notes (Signed)
[facility] BRIEF PROGRESS NOTE  3 Days Post-Op  S/P Procedure(s) (LRB): Right/Left Heart Cath and Coronary Angiography (N/A)   I have personally reviewed the patient's ECHO and CT angiograms performed earlier today and discussed the findings at length with him.  He appears to be a good candidate for minimally invasive approach for aortic valve replacement.  The relative risks and benefits of each have been reviewed as they pertain to the patient's specific circumstances, and all of their questions have been addressed.  He understands and accepts all potential risks of surgery including but not limited to risk of death, stroke or other neurologic complication, myocardial infarction, congestive heart failure, respiratory failure, renal failure, bleeding requiring transfusion and/or reexploration, arrhythmia, infection or other wound complications, pneumonia, pleural and/or pericardial effusion, pulmonary embolus, aortic dissection or other major vascular complication, or delayed complications related to valve repair or replacement including but not limited to structural valve deterioration and failure, thrombosis, embolization, endocarditis, or paravalvular leak.  Specific risks potentially related to the minimally-invasive approach were discussed at length, including but not limited to risk of conversion to full or partial sternotomy, aortic dissection or other major vascular complication, unilateral acute lung injury or pulmonary edema, phrenic nerve dysfunction or paralysis, rib fracture, chronic pain, lung hernia, or lymphocele.  We discussed expectations regarding his postoperative convalescence.     All of their questions have been answered.  Plan: For OR Wednesday morning.  Purcell Nailslarence H Bronsyn Shappell, MD 07/12/2016 6:35 PM

## 2016-07-12 NOTE — Progress Notes (Signed)
Pre-op Cardiac Surgery  Carotid Findings:  Bilateral 1-39% ICA stenosis, antegrade vertebral flow.   Upper Extremity Right Left  Brachial Pressures 123 143  Radial Waveforms Triphasic Triphasic  Ulnar Waveforms Triphasic Triphasic  Palmar Arch (Allen's Test) Waveform is unchanged with radial compression and slightly diminished with ulnar compression Waveform slightly increases with radial compression and reversed with ulnar compression   Jerry Odonnell Jerry Odonnell Jerry Odonnell Jerry Odonnell- RDMS, RVT 12:02 PM  07/12/2016

## 2016-07-12 NOTE — Progress Notes (Signed)
Pt given Cardiac surgery patient education booklet. Will monitor patient. Coreen Shippee, Randall AnKristin Jessup RN

## 2016-07-13 LAB — APTT: aPTT: 39 seconds — ABNORMAL HIGH (ref 24–36)

## 2016-07-13 LAB — PROTIME-INR
INR: 1.14
Prothrombin Time: 14.7 seconds (ref 11.4–15.2)

## 2016-07-13 LAB — ABO/RH: ABO/RH(D): B POS

## 2016-07-13 MED ORDER — BISACODYL 5 MG PO TBEC
5.0000 mg | DELAYED_RELEASE_TABLET | Freq: Once | ORAL | Status: AC
  Administered 2016-07-13: 5 mg via ORAL
  Filled 2016-07-13: qty 1

## 2016-07-13 MED ORDER — SODIUM CHLORIDE 0.9 % IV SOLN
INTRAVENOUS | Status: DC
  Filled 2016-07-13 (×2): qty 30

## 2016-07-13 MED ORDER — CHLORHEXIDINE GLUCONATE 4 % EX LIQD
60.0000 mL | Freq: Once | CUTANEOUS | Status: AC
  Administered 2016-07-13: 4 via TOPICAL
  Filled 2016-07-13: qty 60

## 2016-07-13 MED ORDER — SODIUM CHLORIDE 0.9 % IV SOLN
30.0000 ug/min | INTRAVENOUS | Status: DC
  Filled 2016-07-13 (×2): qty 2

## 2016-07-13 MED ORDER — SODIUM CHLORIDE 0.9 % IV SOLN
1500.0000 mg | INTRAVENOUS | Status: AC
  Administered 2016-07-14: 1500 mg via INTRAVENOUS
  Filled 2016-07-13 (×2): qty 1500

## 2016-07-13 MED ORDER — DEXTROSE 5 % IV SOLN
1.5000 g | INTRAVENOUS | Status: AC
  Administered 2016-07-14: 1.5 g via INTRAVENOUS
  Administered 2016-07-14: 750 g via INTRAVENOUS
  Filled 2016-07-13 (×2): qty 1.5

## 2016-07-13 MED ORDER — PLASMA-LYTE 148 IV SOLN
INTRAVENOUS | Status: AC
  Administered 2016-07-14: 500 mL
  Filled 2016-07-13 (×2): qty 2.5

## 2016-07-13 MED ORDER — MAGNESIUM SULFATE 50 % IJ SOLN
40.0000 meq | INTRAMUSCULAR | Status: DC
  Filled 2016-07-13 (×2): qty 10

## 2016-07-13 MED ORDER — TEMAZEPAM 15 MG PO CAPS
15.0000 mg | ORAL_CAPSULE | Freq: Once | ORAL | Status: AC | PRN
  Administered 2016-07-13: 15 mg via ORAL
  Filled 2016-07-13: qty 1

## 2016-07-13 MED ORDER — TRANEXAMIC ACID (OHS) PUMP PRIME SOLUTION
2.0000 mg/kg | INTRAVENOUS | Status: DC
  Filled 2016-07-13 (×2): qty 1.78

## 2016-07-13 MED ORDER — NITROGLYCERIN IN D5W 200-5 MCG/ML-% IV SOLN
2.0000 ug/min | INTRAVENOUS | Status: DC
  Filled 2016-07-13: qty 250

## 2016-07-13 MED ORDER — CHLORHEXIDINE GLUCONATE 4 % EX LIQD
60.0000 mL | Freq: Once | CUTANEOUS | Status: AC
  Administered 2016-07-14: 4 via TOPICAL
  Filled 2016-07-13 (×2): qty 60

## 2016-07-13 MED ORDER — METOPROLOL TARTRATE 12.5 MG HALF TABLET: 12.5000 mg | ORAL_TABLET | Freq: Once | ORAL | Status: DC

## 2016-07-13 MED ORDER — EPINEPHRINE PF 1 MG/ML IJ SOLN
0.0000 ug/min | INTRAVENOUS | Status: DC
  Filled 2016-07-13 (×2): qty 4

## 2016-07-13 MED ORDER — SODIUM CHLORIDE 0.9 % IV SOLN
INTRAVENOUS | Status: AC
  Administered 2016-07-14: 1.2 [IU]/h via INTRAVENOUS
  Filled 2016-07-13 (×2): qty 2.5

## 2016-07-13 MED ORDER — CHLORHEXIDINE GLUCONATE 0.12 % MT SOLN
15.0000 mL | Freq: Once | OROMUCOSAL | Status: AC
  Administered 2016-07-14: 15 mL via OROMUCOSAL
  Filled 2016-07-13: qty 15

## 2016-07-13 MED ORDER — POTASSIUM CHLORIDE 2 MEQ/ML IV SOLN
80.0000 meq | INTRAVENOUS | Status: DC
  Filled 2016-07-13 (×2): qty 40

## 2016-07-13 MED ORDER — TRANEXAMIC ACID (OHS) BOLUS VIA INFUSION
15.0000 mg/kg | INTRAVENOUS | Status: AC
  Administered 2016-07-14: 1336.5 mg via INTRAVENOUS
  Filled 2016-07-13: qty 1337

## 2016-07-13 MED ORDER — DEXTROSE 5 % IV SOLN
750.0000 mg | INTRAVENOUS | Status: DC
  Filled 2016-07-13 (×2): qty 750

## 2016-07-13 MED ORDER — DOPAMINE-DEXTROSE 3.2-5 MG/ML-% IV SOLN
0.0000 ug/kg/min | INTRAVENOUS | Status: DC
  Filled 2016-07-13: qty 250

## 2016-07-13 MED ORDER — VANCOMYCIN HCL 1000 MG IV SOLR
INTRAVENOUS | Status: AC
  Administered 2016-07-14: 1000 mL
  Filled 2016-07-13 (×2): qty 1000

## 2016-07-13 MED ORDER — TRANEXAMIC ACID 1000 MG/10ML IV SOLN
1.5000 mg/kg/h | INTRAVENOUS | Status: AC
  Administered 2016-07-14: 1.5 mg/kg/h via INTRAVENOUS
  Filled 2016-07-13 (×2): qty 25

## 2016-07-13 MED ORDER — DEXMEDETOMIDINE HCL IN NACL 400 MCG/100ML IV SOLN
0.1000 ug/kg/h | INTRAVENOUS | Status: AC
  Administered 2016-07-14: .2 ug/kg/h via INTRAVENOUS
  Filled 2016-07-13 (×2): qty 100

## 2016-07-13 NOTE — Progress Notes (Addendum)
[address]       [place] [address]             [phone]     CARDIOTHORACIC SURGERY PROGRESS NOTE  4 Days Post-Op  S/P Procedure(s) (LRB): Right/Left Heart Cath and Coronary Angiography (N/A)  Subjective: No complaints  Objective: Vital signs in last 24 hours: Temp:  [97.8 F (36.6 C)-98.1 F (36.7 C)] 97.8 F (36.6 C) (02/20 0443) Pulse Rate:  [60-70] 60 (02/20 0443) Cardiac Rhythm: Normal sinus rhythm (02/19 1921) Resp:  [20] 20 (02/20 0443) BP: (113-143)/(60-84) 143/64 (02/20 0443) SpO2:  [99 %-100 %] 99 % (02/20 0443)  Physical Exam:  Rhythm:   sinus  Breath sounds: clear  Heart sounds:  RRR w/ prominent systolic murmur  Incisions:  n/a  Abdomen:  Soft, non-distended, non-tender  Extremities:  Warm, well-perfused    Intake/Output from previous day: 02/19 0701 - 02/20 0700 In: 480 [P.O.:480] Out: 1250 [Urine:1250] Intake/Output this shift: No intake/output data recorded.  Lab Results:  Recent Labs  07/10/16 0838  WBC 8.3  HGB 15.4  HCT 45.1  PLT 144*   BMET: No results for input(s): NA, K, CL, CO2, GLUCOSE, BUN, CREATININE, CALCIUM in the last 72 hours.  CBG (last 3)  No results for input(s): GLUCAP in the last 72 hours. PT/INR:  No results for input(s): LABPROT, INR in the last 72 hours.  CXR:  CHEST  2 VIEW  COMPARISON:  September 13, 2005  FINDINGS: The lungs are clear. The heart size and pulmonary vascularity are normal. No adenopathy. No pneumothorax. No bone lesions. There is eventration of the C7 transverse processes bilaterally.  IMPRESSION: No edema or consolidation.   Electronically Signed   By: Bretta Bang III M.D.   On: 07/09/2016 10:41    Transthoracic Echocardiography  Patient:    Jibril, Mcminn MR #:       [id number] Study Date: 07/12/2016 Gender:     M Age:        18 Height:     180.3 cm Weight:     89.1 kg BSA:        2.13 m^2 Pt. Status: Room:       [id number]   Rutherford Limerick, M.D.  REFERRING    Tressie Stalker, M.D.  SONOGRAPHER  [address]  ATTENDING    [place], Lorin Picket [phone]  ADMITTING    Bryan Lemma, MD  PERFORMING   [facility], Inpatient  cc:  ------------------------------------------------------------------- LV EF: 60% -   65%  ------------------------------------------------------------------- Indications:      Aortic stenosis 424.1.  ------------------------------------------------------------------- History:   PMH:   Syncope.  Transient ischemic attack.  Risk factors:  Dyslipidemia.  ------------------------------------------------------------------- Study Conclusions  - Left ventricle: The cavity size was normal. Wall thickness was   increased in a pattern of mild LVH. Systolic function was normal.   The estimated ejection fraction was in the range of 60% to 65%.   Doppler parameters are consistent with abnormal left ventricular   relaxation (grade 1 diastolic dysfunction). - Aortic valve: Cannot tell if valve is tri-leaflet or not. There   was critical stenosis. There was mild regurgitation. Valve area   (VTI): 0.68 cm^2. Valve area (Vmax): 0.71 cm^2. Valve area   (Vmean): 0.62 cm^2. - Atrial septum: No defect or patent foramen ovale was identified.  ------------------------------------------------------------------- Study data:   Study status:  Routine.  Procedure:  Transthoracic echocardiography. Image quality was adequate.  Study completion: There were no complications.          Transthoracic echocardiography.  M-mode, complete 2D, spectral Doppler, and color Doppler.  Birthdate:  Patient birthdate: [birth year].  Age:  Patient is 61 yr old.  Sex:  Gender: male.    BMI: 27.4 kg/m^2.  Blood pressure:     116/61  Patient status:  Inpatient.  Study date: Study date: 07/12/2016. Study time: 10:09 AM.  Location:   Echo laboratory.  -------------------------------------------------------------------  ------------------------------------------------------------------- Left ventricle:  The cavity size was normal. Wall thickness was increased in a pattern of mild LVH. Systolic function was normal. The estimated ejection fraction was in the range of 60% to 65%. Doppler parameters are consistent with abnormal left ventricular relaxation (grade 1 diastolic dysfunction).  ------------------------------------------------------------------- Aortic valve:  Cannot tell if valve is tri-leaflet or not. Severely thickened, severely calcified leaflets.  Doppler:   There was critical stenosis.   There was mild regurgitation.    VTI ratio of LVOT to aortic valve: 0.18. Valve area (VTI): 0.68 cm^2. Indexed valve area (VTI): 0.32 cm^2/m^2. Peak velocity ratio of LVOT to aortic valve: 0.19. Valve area (Vmax): 0.71 cm^2. Indexed valve area (Vmax): 0.33 cm^2/m^2. Mean velocity ratio of LVOT to aortic valve: 0.16. Valve area (Vmean): 0.62 cm^2. Indexed valve area (Vmean): 0.29 cm^2/m^2.    Mean gradient (S): 88 mm Hg. Peak gradient (S): 133 mm Hg.  ------------------------------------------------------------------- Aorta:  The aorta was normal, not dilated, and non-diseased.  ------------------------------------------------------------------- Mitral valve:   Doppler:  There was trivial regurgitation.  ------------------------------------------------------------------- Left atrium:  The atrium was normal in size.  ------------------------------------------------------------------- Atrial septum:  No defect or patent foramen ovale was identified.   ------------------------------------------------------------------- Right ventricle:  The cavity size was normal. Wall thickness was normal. Systolic function was normal.  ------------------------------------------------------------------- Pulmonic valve:     Doppler:  There was trivial regurgitation.  ------------------------------------------------------------------- Tricuspid valve:   Doppler:  There was mild regurgitation.  ------------------------------------------------------------------- Right atrium:  The atrium was normal in size.  ------------------------------------------------------------------- Pericardium:  The pericardium was normal in appearance.  ------------------------------------------------------------------- Systemic veins: Inferior vena cava: The vessel was normal in size. The respirophasic diameter changes were in the normal range (>= 50%), consistent with normal central venous pressure.  ------------------------------------------------------------------- Post procedure conclusions Ascending Aorta:  - The aorta was normal, not dilated, and non-diseased.  ------------------------------------------------------------------- Measurements   Left ventricle                           Value          Reference  LV ID, ED, PLAX chordal                  43.1  mm       43 - 52  LV ID, ES, PLAX chordal                  31.3  mm       23 - 38  LV fx shortening, PLAX chordal   (L)     27    %        >=29  LV PW thickness, ED                      15    mm       ----------  IVS/LV PW ratio, ED  0.83           <=1.3  Stroke volume, 2D                        110   ml       ----------  Stroke volume/bsa, 2D                    52    ml/m^2   ----------  LV e&', lateral                           7.18  cm/s     ----------  LV E/e&', lateral                         7.7            ----------  LV e&', medial                            5.66  cm/s     ----------  LV E/e&', medial                          9.77           ----------  LV e&', average                           6.42  cm/s     ----------  LV E/e&', average                         8.61           ----------    Ventricular septum                        Value          Reference  IVS thickness, ED                        12.4  mm       ----------    LVOT                                     Value          Reference  LVOT ID, S                               22    mm       ----------  LVOT area                                3.8   cm^2     ----------  LVOT peak velocity, S                    108   cm/s     ----------  LVOT mean velocity, S                    72    cm/s     ----------  LVOT VTI, S                              28.9  cm       ----------    Aortic valve                             Value          Reference  Aortic valve peak velocity, S            577   cm/s     ----------  Aortic valve mean velocity, S            443   cm/s     ----------  Aortic valve VTI, S                      161   cm       ----------  Aortic mean gradient, S                  88    mm Hg    ----------  Aortic peak gradient, S                  133   mm Hg    ----------  VTI ratio, LVOT/AV                       0.18           ----------  Aortic valve area, VTI                   0.68  cm^2     ----------  Aortic valve area/bsa, VTI               0.32  cm^2/m^2 ----------  Velocity ratio, peak, LVOT/AV            0.19           ----------  Aortic valve area, peak velocity         0.71  cm^2     ----------  Aortic valve area/bsa, peak              0.33  cm^2/m^2 ----------  velocity  Velocity ratio, mean, LVOT/AV            0.16           ----------  Aortic valve area, mean velocity         0.62  cm^2     ----------  Aortic valve area/bsa, mean              0.29  cm^2/m^2 ----------  velocity    Aorta                                    Value          Reference  Aortic root ID, ED                       20    mm       ----------    Left atrium  Value          Reference  LA ID, A-P, ES                           36    mm       ----------  LA ID/bsa, A-P                           1.69  cm/m^2   <=2.2  LA volume, ES, 1-p A4C                    72.3  ml       ----------  LA volume/bsa, ES, 1-p A4C               34    ml/m^2   ----------  LA volume, ES, 1-p A2C                   40.7  ml       ----------  LA volume/bsa, ES, 1-p A2C               19.1  ml/m^2   ----------    Mitral valve                             Value          Reference  Mitral E-wave peak velocity              55.3  cm/s     ----------  Mitral A-wave peak velocity              93.1  cm/s     ----------  Mitral deceleration time         (H)     239   ms       150 - 230  Mitral E/A ratio, peak                   0.6            ----------    Right atrium                             Value          Reference  RA ID, S-I, ES, A4C              (H)     72.2  mm       34 - 49  RA area, ES, A4C                 (H)     28    cm^2     8.3 - 19.5  RA volume, ES, A/L                       91.6  ml       ----------  RA volume/bsa, ES, A/L                   43    ml/m^2   ----------    Right ventricle                          Value  Reference  RV s&', lateral, S                        11.7  cm/s     ----------  Legend: (L)  and  (H)  mark values outside specified reference range.  ------------------------------------------------------------------- Prepared and Electronically Authenticated by  Charlton Haws, M.D. 2018-02-19T11:32:34     Cardiac TAVR CT  TECHNIQUE: The patient was scanned on a Philips 256 scanner. A 120 kV retrospective scan was triggered in the descending thoracic aorta at 111 HU's. Gantry rotation speed was 270 msecs and collimation was .9 mm. No beta blockade or nitro were given. The 3D data set was reconstructed in 5% intervals of the R-R cycle. Systolic and diastolic phases were analyzed on a dedicated work station using MPR, MIP and VRT modes. The patient received 80 cc of contrast.  FINDINGS: Aortic Valve: Functionally bicuspid, severely calcified and thickened with severely restricted leaflet opening.  Aorta:  Normal  size, no calcifications, no dissection.  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 It originated high anteriorly between right and left coronary cusps and runs partially between aorta and pulmonary artery. There is no obvious obstruction of flow in systole. RCA is a dominant artery that gives rise to PDA and PLA arteries. RCA has no obvious plaque, PDA is poorly visualized but has no obvious plaque.  Left main is a large artery with no plaque. It gives rise to LAD, two rami intermedia and LCX artery.  LAD is a large artery that has moderate excentric calcified plaque in the ostial and proximal portion with associated stenosis 25-50%. There is another calcified plaque in the mid portion with stenosis 25-50% and another in the distal portion.  The first ramus intermedium has calcified plaque in its ostium with associated 25-50% stenosis, otherwise no plaque.  Second ramus intermedius is a small artery that has no plaque.  LCX artery is a small non-dominant artery with no plaque.  Normal size of the pulmonary artery.  Normal pulmonary vein drainage into the left atrium.  No thrombus in the left atrial appendage.  No ASD/VSD.  Severe concentric left ventricular hypertrophy.  IMPRESSION: 1. Functionally bicuspid, severely calcified and thickened with severely restricted leaflet opening.  2.  Aorta has normal size, no calcifications, no dissection.  3. 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 It originated high anteriorly between right and left coronary cusps and runs partially between aorta  and pulmonary artery. There is no obvious obstruction of flow in systole.  4. There is moderate eccentric calcification of the ostial LAD with only mild CAD.  5. Severe concentric left ventricular hypertrophy.  Tobias Alexander   Electronically Signed   By: Tobias Alexander   On: 07/12/2016 19:18   CT ANGIOGRAPHY CHEST, ABDOMEN AND PELVIS  TECHNIQUE: Multidetector CT imaging through the chest, abdomen and pelvis was performed using the standard protocol during bolus administration of intravenous contrast. Multiplanar reconstructed images and MIPs were obtained and reviewed to evaluate the vascular anatomy.  CONTRAST:  100 mL of  Isovue 370.  COMPARISON:  Chest CT 06/26/2002.  FINDINGS: CTA CHEST FINDINGS  Cardiovascular: Heart size is borderline enlarged with concentric left ventricular hypertrophy. There is no significant pericardial fluid, thickening or pericardial calcification. There is aortic atherosclerosis, as well as atherosclerosis of the great vessels of the mediastinum and the coronary arteries, including calcified atherosclerotic plaque in the distal left main and proximal left circumflex coronary arteries.  Mediastinum/Lymph Nodes: No pathologically enlarged mediastinal or hilar lymph nodes. Esophagus is unremarkable in appearance. No axillary lymphadenopathy.  Lungs/Pleura: No acute consolidative airspace disease. No pleural effusions. No suspicious appearing pulmonary nodules or masses.  Musculoskeletal/Soft Tissues: There are no aggressive appearing lytic or blastic lesions noted in the visualized portions of the skeleton.  CTA ABDOMEN AND PELVIS FINDINGS  Hepatobiliary: Well-defined 1.5 cm low -intermediate attenuation (33 HU) lesion in segment 7 of the liver has a benign appearance most suggestive of a proteinaceous cyst (this is only slightly larger than remote prior study from 2004). No other suspicious appearing hepatic  lesions are noted. No intra or extrahepatic biliary ductal dilatation. Gallbladder is normal in appearance.  Pancreas: No pancreatic mass. No pancreatic ductal dilatation. No pancreatic or peripancreatic fluid or inflammatory changes.  Spleen: Unremarkable.  Adrenals/Urinary Tract: Bilateral kidneys and bilateral adrenal glands are normal in appearance. There is no hydroureteronephrosis. Urinary bladder is normal in appearance.  Stomach/Bowel: Normal appearance of the stomach. No pathologic dilatation of small bowel or colon. Normal appendix.  Vascular/Lymphatic: Aortic atherosclerosis with vascular findings and measurements pertinent to potential TAVR procedure, as detailed below. No aneurysm or dissection noted in the abdominal or pelvic vasculature. The celiac axis, superior mesenteric artery and inferior mesenteric artery and their major branches are all widely patent without definite hemodynamically significant stenosis. Two right-sided renal arteries and a single left renal artery (with early bifurcation) are all widely patent without hemodynamically significant stenosis. Retroaortic left renal vein (normal anatomical variant) incidentally noted. No lymphadenopathy noted in the abdomen or pelvis.  Reproductive: Prostate gland and seminal vesicles are unremarkable in appearance.  Other: No significant volume of ascites.  No pneumoperitoneum.  Musculoskeletal: There are no aggressive appearing lytic or blastic lesions noted in the visualized portions of the skeleton.  VASCULAR MEASUREMENTS PERTINENT TO TAVR:  AORTA:  Minimal Aortic Diameter -  14.1 x 16.5 mm  Severity of Aortic Calcification -  mild  RIGHT PELVIS:  Right Common Iliac Artery -  Minimal Diameter - 10.8 x 9.1 mm  Tortuosity - mild  Calcification - mild  Right External Iliac Artery -  Minimal Diameter - 8.7 x 8.1 mm  Tortuosity - mild to moderate  Calcification -  none  Right Common Femoral Artery -  Minimal Diameter - 8.8 x 7.3 mm  Tortuosity - mild  Calcification - mild  LEFT PELVIS:  Left Common Iliac Artery -  Minimal Diameter - 8.7 x 9.4 mm  Tortuosity - mild  Calcification - mild  Left External Iliac Artery -  Minimal Diameter - 8.0 x 7.6 mm  Tortuosity - mild to moderate  Calcification - none  Left Common Femoral Artery -  Minimal Diameter - 9.3 x 8.7 mm  Tortuosity - mild  Calcification - mild  Review of the MIP images confirms the above findings.  IMPRESSION: 1. Vascular findings and measurements pertinent to potential TAVR procedure, as detailed above. This patient does appear to have suitable pelvic arterial access bilaterally. 2. Severe thickening and calcification of the aortic valve, compatible with the reported clinical history of  severe aortic stenosis. 3. Additional incidental findings, as above.   Electronically Signed   By: Trudie Reedaniel  Entrikin M.D.   On: 07/12/2016 17:02    Assessment/Plan: S/P Procedure(s) (LRB): Right/Left Heart Cath and Coronary Angiography (N/A)  I have again reviewed the indications, risks and potential benefits of surgery with the patient this morning For OR tomorrow All questions answered  I spent in excess of 15 minutes during the conduct of this hospital encounter and >50% of this time involved direct face-to-face encounter with the patient for counseling and/or coordination of their care.  Purcell Nailslarence H Jarrett Albor, MD 07/13/2016 7:44 AM

## 2016-07-13 NOTE — Progress Notes (Signed)
Progress Note  Patient Name: Jerry Odonnell Date of Encounter: 07/13/2016  Primary Cardiologist:   Subjective   The patient is in good spirits and looking forward to his surgery tomorrow. He was seen by Dr.Owen, and he is a candidate for minimally invasive aortic valve replacement  Inpatient Medications    Scheduled Meds: . aspirin EC  81 mg Oral Daily  . atorvastatin  10 mg Oral q1800  . heparin  5,000 Units Subcutaneous Q8H  . sodium chloride flush  3 mL Intravenous Q12H   Continuous Infusions:  PRN Meds: sodium chloride, acetaminophen, ALPRAZolam, nitroGLYCERIN, ondansetron (ZOFRAN) IV, sodium chloride flush, zolpidem   Vital Signs    Vitals:   07/12/16 0533 07/12/16 1320 07/12/16 2011 07/13/16 0443  BP: 116/61 136/84 113/60 (!) 143/64  Pulse: (!) 56 62 70 60  Resp: 18 20 20 20   Temp: 97.9 F (36.6 C) 98 F (36.7 C) 98.1 F (36.7 C) 97.8 F (36.6 C)  TempSrc: Oral Oral Oral Oral  SpO2: 99% 100% 99% 99%  Weight:      Height:        Intake/Output Summary (Last 24 hours) at 07/13/16 0709 Last data filed at 07/12/16 1800  Gross per 24 hour  Intake              480 ml  Output             1250 ml  Net             -770 ml   Filed Weights   07/10/16 0410  Weight: 196 lb 8 oz (89.1 kg)    Telemetry      ECG     Physical Exam   The patient is oriented to person time and place. Affect is normal. He feels well this morning.  Psych: Normal affect   Labs    Chemistry Recent Labs Lab 07/09/16 0922 07/09/16 1837  NA 138  --   K 4.2  --   CL 107  --   CO2 23  --   GLUCOSE 126*  --   BUN 17  --   CREATININE 0.99  --   CALCIUM 9.1  --   PROT  --  5.8*  ALBUMIN  --  3.8  AST  --  28  ALT  --  42  ALKPHOS  --  70  BILITOT  --  1.4*  GFRNONAA >60  --   GFRAA >60  --   ANIONGAP 8  --      Hematology Recent Labs Lab 07/09/16 0922 07/10/16 0838  WBC 8.5 8.3  RBC 4.63 4.84  HGB 14.7 15.4  HCT 42.6 45.1  MCV 92.0 93.2  MCH 31.7 31.8   MCHC 34.5 34.1  RDW 13.0 13.1  PLT 136* 144*    Cardiac Enzymes Recent Labs Lab 07/09/16 1001 07/09/16 1301 07/09/16 1837 07/09/16 2307  TROPONINI 0.08* 0.22* 0.15* 0.28*   No results for input(s): TROPIPOC in the last 168 hours.   BNPNo results for input(s): BNP, PROBNP in the last 168 hours.   DDimer No results for input(s): DDIMER in the last 168 hours.   Radiology    Ct Cardiac Morph/pulm Vein W/cm&w/o Ca Score  Addendum Date: 07/12/2016   ADDENDUM REPORT: 07/12/2016 19:18 CLINICAL DATA:  61 year old male with severe aortic stenosis. EXAM: Cardiac TAVR CT TECHNIQUE: The patient was scanned on a Philips 256 scanner. A 120 kV retrospective scan was triggered in the descending thoracic aorta  at 111 HU's. Gantry rotation speed was 270 msecs and collimation was .9 mm. No beta blockade or nitro were given. The 3D data set was reconstructed in 5% intervals of the R-R cycle. Systolic and diastolic phases were analyzed on a dedicated work station using MPR, MIP and VRT modes. The patient received 80 cc of contrast. FINDINGS: Aortic Valve: Functionally bicuspid, severely calcified and thickened with severely restricted leaflet opening. Aorta:  Normal size, no calcifications, no dissection. 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 It originated high anteriorly between right and left coronary cusps and runs partially between aorta and pulmonary artery. There is no obvious obstruction of flow in systole. RCA is a dominant artery that gives rise to PDA and PLA arteries. RCA has no obvious plaque, PDA is poorly visualized but has no obvious plaque. Left main is a large  artery with no plaque. It gives rise to LAD, two rami intermedia and LCX artery. LAD is a large artery that has moderate excentric calcified plaque in the ostial and proximal portion with associated stenosis 25-50%. There is another calcified plaque in the mid portion with stenosis 25-50% and another in the distal portion. The first ramus intermedium has calcified plaque in its ostium with associated 25-50% stenosis, otherwise no plaque. Second ramus intermedius is a small artery that has no plaque. LCX artery is a small non-dominant artery with no plaque. Normal size of the pulmonary artery. Normal pulmonary vein drainage into the left atrium. No thrombus in the left atrial appendage. No ASD/VSD. Severe concentric left ventricular hypertrophy. IMPRESSION: 1. Functionally bicuspid, severely calcified and thickened with severely restricted leaflet opening. 2.  Aorta has normal size, no calcifications, no dissection. 3. 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 It originated high anteriorly between right and left coronary cusps and runs partially between aorta and pulmonary artery. There is no obvious obstruction of flow in systole. 4. There is moderate eccentric calcification of the ostial LAD with only mild CAD. 5. Severe concentric left ventricular hypertrophy. Tobias Alexander Electronically Signed   By: Tobias Alexander   On: 07/12/2016 19:18   Result Date: 07/12/2016 EXAM: OVER-READ INTERPRETATION  CT CHEST The following report is an over-read performed by radiologist Dr. Royal Piedra [facility] on 07/12/2016. This over-read does not include interpretation of cardiac or coronary anatomy or pathology. The coronary calcium score/coronary CTA interpretation by the cardiologist is attached. COMPARISON:  Chest CT 06/26/2002. FINDINGS: Extracardiac findings will be described on separate dictation for contemporaneously obtained CTA of the chest, abdomen and pelvis 07/12/2016. IMPRESSION: Please see  separate dictation for contemporaneously obtained CTA of the chest, abdomen and pelvis dated 07/12/2016 for full description of extracardiac findings. Electronically Signed: By: Trudie Reed M.D. On: 07/12/2016 16:15   Ct Angio Chest/abd/pel For Dissection W And/or W/wo  Result Date: 07/12/2016 CLINICAL DATA:  61 year old male with history of aortic stenosis. Preprocedural study prior to potential transcatheter aortic valve replacement (TAVR) procedure. EXAM: CT ANGIOGRAPHY CHEST, ABDOMEN AND PELVIS TECHNIQUE: Multidetector CT imaging through the chest, abdomen and pelvis was performed using the standard protocol during bolus administration of intravenous contrast. Multiplanar reconstructed  images and MIPs were obtained and reviewed to evaluate the vascular anatomy. CONTRAST:  100 mL of Isovue 370. COMPARISON:  Chest CT 06/26/2002. FINDINGS: CTA CHEST FINDINGS Cardiovascular: Heart size is borderline enlarged with concentric left ventricular hypertrophy. There is no significant pericardial fluid, thickening or pericardial calcification. There is aortic atherosclerosis, as well as atherosclerosis of the great vessels of the mediastinum and the coronary arteries, including calcified atherosclerotic plaque in the distal left main and proximal left circumflex coronary arteries. Mediastinum/Lymph Nodes: No pathologically enlarged mediastinal or hilar lymph nodes. Esophagus is unremarkable in appearance. No axillary lymphadenopathy. Lungs/Pleura: No acute consolidative airspace disease. No pleural effusions. No suspicious appearing pulmonary nodules or masses. Musculoskeletal/Soft Tissues: There are no aggressive appearing lytic or blastic lesions noted in the visualized portions of the skeleton. CTA ABDOMEN AND PELVIS FINDINGS Hepatobiliary: Well-defined 1.5 cm low -intermediate attenuation (33 HU) lesion in segment 7 of the liver has a benign appearance most suggestive of a proteinaceous cyst (this is only  slightly larger than remote prior study from 2004). No other suspicious appearing hepatic lesions are noted. No intra or extrahepatic biliary ductal dilatation. Gallbladder is normal in appearance. Pancreas: No pancreatic mass. No pancreatic ductal dilatation. No pancreatic or peripancreatic fluid or inflammatory changes. Spleen: Unremarkable. Adrenals/Urinary Tract: Bilateral kidneys and bilateral adrenal glands are normal in appearance. There is no hydroureteronephrosis. Urinary bladder is normal in appearance. Stomach/Bowel: Normal appearance of the stomach. No pathologic dilatation of small bowel or colon. Normal appendix. Vascular/Lymphatic: Aortic atherosclerosis with vascular findings and measurements pertinent to potential TAVR procedure, as detailed below. No aneurysm or dissection noted in the abdominal or pelvic vasculature. The celiac axis, superior mesenteric artery and inferior mesenteric artery and their major branches are all widely patent without definite hemodynamically significant stenosis. Two right-sided renal arteries and a single left renal artery (with early bifurcation) are all widely patent without hemodynamically significant stenosis. Retroaortic left renal vein (normal anatomical variant) incidentally noted. No lymphadenopathy noted in the abdomen or pelvis. Reproductive: Prostate gland and seminal vesicles are unremarkable in appearance. Other: No significant volume of ascites.  No pneumoperitoneum. Musculoskeletal: There are no aggressive appearing lytic or blastic lesions noted in the visualized portions of the skeleton. VASCULAR MEASUREMENTS PERTINENT TO TAVR: AORTA: Minimal Aortic Diameter -  14.1 x 16.5 mm Severity of Aortic Calcification -  mild RIGHT PELVIS: Right Common Iliac Artery - Minimal Diameter - 10.8 x 9.1 mm Tortuosity - mild Calcification - mild Right External Iliac Artery - Minimal Diameter - 8.7 x 8.1 mm Tortuosity - mild to moderate Calcification - none Right Common  Femoral Artery - Minimal Diameter - 8.8 x 7.3 mm Tortuosity - mild Calcification - mild LEFT PELVIS: Left Common Iliac Artery - Minimal Diameter - 8.7 x 9.4 mm Tortuosity - mild Calcification - mild Left External Iliac Artery - Minimal Diameter - 8.0 x 7.6 mm Tortuosity - mild to moderate Calcification - none Left Common Femoral Artery - Minimal Diameter - 9.3 x 8.7 mm Tortuosity - mild Calcification - mild Review of the MIP images confirms the above findings. IMPRESSION: 1. Vascular findings and measurements pertinent to potential TAVR procedure, as detailed above. This patient does appear to have suitable pelvic arterial access bilaterally. 2. Severe thickening and calcification of the aortic valve, compatible with the reported clinical history of severe aortic stenosis. 3. Additional incidental findings, as above. Electronically Signed   By: Trudie Reedaniel  Entrikin M.D.   On: 07/12/2016 17:02    Cardiac Studies  Patient Profile     61 y.o. male  with severe aortic stenosis who had a syncopal episode.  Assessment & Plan    Syncope and collapse       He had a significant episode related to his aortic stenosis. He is being watched carefully in the hospital before his aortic valve surgery.    Critical aortic valve stenosis     The patient needs aortic valve surgery. There has been careful assessment by Dr. Laneta Simmers. In addition the patient was seen by Dr.Owen who agreed that minimally invasive aortic valve replacement could be done. This is scheduled for tomorrow, July 14, 2016. Carotid Dopplers revealed no significant abnormalities.  Willa Rough, MD  07/13/2016, 7:09 AM

## 2016-07-13 NOTE — Progress Notes (Signed)
1100-1120 Pt has [facility] booklet and has been reading. Gave care guide and wrote down how to view pre op video. Pt does not have IS but I stressed importance of mobility and IS after surgery. He stated he did not need IS now since going for surgery tomorrow and would have to keep up with it  He showed me in [facility] booklet that he has been reading about it and feels ok getting it after surgery.. Discussed with pt that normally surgeons like someone with pt after discharge for first week. He stated he has made arrangements for about three days but this can be discussed after surgery. Walking independently so did not ambulate with pt. Will follow up after surgery  Pt very active and liked to walk prior to admission.Luetta Nutting. Benn Tarver RN BSN 07/13/2016 11:24 AM

## 2016-07-14 ENCOUNTER — Encounter (HOSPITAL_COMMUNITY)
Admission: EM | Disposition: A | Discharge status: Home / Self Care | Payer: Self-pay | Source: Home / Self Care | Attending: Interventional Cardiology

## 2016-07-14 ENCOUNTER — Inpatient Hospital Stay (HOSPITAL_COMMUNITY): Payer: BLUE CROSS/BLUE SHIELD | Admitting: Certified Registered"

## 2016-07-14 ENCOUNTER — Inpatient Hospital Stay (HOSPITAL_COMMUNITY): Payer: BLUE CROSS/BLUE SHIELD

## 2016-07-14 ENCOUNTER — Inpatient Hospital Stay (HOSPITAL_COMMUNITY): Payer: BLUE CROSS/BLUE SHIELD | Admitting: Anesthesiology

## 2016-07-14 ENCOUNTER — Encounter (HOSPITAL_COMMUNITY): Payer: Self-pay | Admitting: Anesthesiology

## 2016-07-14 DIAGNOSIS — Z953 Presence of xenogenic heart valve: Secondary | ICD-10-CM

## 2016-07-14 DIAGNOSIS — I9789 Other postprocedural complications and disorders of the circulatory system, not elsewhere classified: Secondary | ICD-10-CM

## 2016-07-14 HISTORY — PX: CHEST EXPLORATION: SHX5104

## 2016-07-14 HISTORY — PX: TEE WITHOUT CARDIOVERSION: SHX5443

## 2016-07-14 HISTORY — PX: AORTIC VALVE REPLACEMENT: SHX41

## 2016-07-14 HISTORY — DX: Presence of xenogenic heart valve: Z95.3

## 2016-07-14 LAB — POCT I-STAT, CHEM 8
BUN: 13 mg/dL (ref 6–20)
BUN: 13 mg/dL (ref 6–20)
BUN: 12 mg/dL (ref 6–20)
BUN: 14 mg/dL (ref 6–20)
BUN: 13 mg/dL (ref 6–20)
BUN: 14 mg/dL (ref 6–20)
BUN: 13 mg/dL (ref 6–20)
BUN: 13 mg/dL (ref 6–20)
BUN: 13 mg/dL (ref 6–20)
CALCIUM ION: 1.27 mmol/L (ref 1.15–1.40)
CALCIUM ION: 1.09 mmol/L — AB (ref 1.15–1.40)
CALCIUM ION: 1.09 mmol/L — AB (ref 1.15–1.40)
CHLORIDE: 99 mmol/L — AB (ref 101–111)
CHLORIDE: 99 mmol/L — AB (ref 101–111)
CHLORIDE: 100 mmol/L — AB (ref 101–111)
CHLORIDE: 99 mmol/L — AB (ref 101–111)
CREATININE: 0.6 mg/dL — AB (ref 0.61–1.24)
CREATININE: 0.6 mg/dL — AB (ref 0.61–1.24)
Calcium, Ion: 1.27 mmol/L (ref 1.15–1.40)
Calcium, Ion: 1.09 mmol/L — ABNORMAL LOW (ref 1.15–1.40)
Calcium, Ion: 1.13 mmol/L — ABNORMAL LOW (ref 1.15–1.40)
Calcium, Ion: 1.14 mmol/L — ABNORMAL LOW (ref 1.15–1.40)
Calcium, Ion: 1.01 mmol/L — ABNORMAL LOW (ref 1.15–1.40)
Calcium, Ion: 1.41 mmol/L — ABNORMAL HIGH (ref 1.15–1.40)
Chloride: 101 mmol/L (ref 101–111)
Chloride: 101 mmol/L (ref 101–111)
Chloride: 101 mmol/L (ref 101–111)
Chloride: 99 mmol/L — ABNORMAL LOW (ref 101–111)
Chloride: 104 mmol/L (ref 101–111)
Creatinine, Ser: 0.5 mg/dL — ABNORMAL LOW (ref 0.61–1.24)
Creatinine, Ser: 0.7 mg/dL (ref 0.61–1.24)
Creatinine, Ser: 0.5 mg/dL — ABNORMAL LOW (ref 0.61–1.24)
Creatinine, Ser: 0.6 mg/dL — ABNORMAL LOW (ref 0.61–1.24)
Creatinine, Ser: 0.6 mg/dL — ABNORMAL LOW (ref 0.61–1.24)
Creatinine, Ser: 0.8 mg/dL (ref 0.61–1.24)
Creatinine, Ser: 0.6 mg/dL — ABNORMAL LOW (ref 0.61–1.24)
GLUCOSE: 120 mg/dL — AB (ref 65–99)
GLUCOSE: 141 mg/dL — AB (ref 65–99)
GLUCOSE: 106 mg/dL — AB (ref 65–99)
GLUCOSE: 112 mg/dL — AB (ref 65–99)
GLUCOSE: 102 mg/dL — AB (ref 65–99)
Glucose, Bld: 123 mg/dL — ABNORMAL HIGH (ref 65–99)
Glucose, Bld: 143 mg/dL — ABNORMAL HIGH (ref 65–99)
Glucose, Bld: 145 mg/dL — ABNORMAL HIGH (ref 65–99)
Glucose, Bld: 132 mg/dL — ABNORMAL HIGH (ref 65–99)
HCT: 38 % — ABNORMAL LOW (ref 39.0–52.0)
HCT: 30 % — ABNORMAL LOW (ref 39.0–52.0)
HCT: 24 % — ABNORMAL LOW (ref 39.0–52.0)
HCT: 26 % — ABNORMAL LOW (ref 39.0–52.0)
HEMATOCRIT: 40 % (ref 39.0–52.0)
HEMATOCRIT: 33 % — AB (ref 39.0–52.0)
HEMATOCRIT: 29 % — AB (ref 39.0–52.0)
HEMATOCRIT: 29 % — AB (ref 39.0–52.0)
HEMATOCRIT: 27 % — AB (ref 39.0–52.0)
HEMOGLOBIN: 12.9 g/dL — AB (ref 13.0–17.0)
HEMOGLOBIN: 13.6 g/dL (ref 13.0–17.0)
HEMOGLOBIN: 9.2 g/dL — AB (ref 13.0–17.0)
Hemoglobin: 10.2 g/dL — ABNORMAL LOW (ref 13.0–17.0)
Hemoglobin: 11.2 g/dL — ABNORMAL LOW (ref 13.0–17.0)
Hemoglobin: 9.9 g/dL — ABNORMAL LOW (ref 13.0–17.0)
Hemoglobin: 9.9 g/dL — ABNORMAL LOW (ref 13.0–17.0)
Hemoglobin: 8.2 g/dL — ABNORMAL LOW (ref 13.0–17.0)
Hemoglobin: 8.8 g/dL — ABNORMAL LOW (ref 13.0–17.0)
POTASSIUM: 4.7 mmol/L (ref 3.5–5.1)
POTASSIUM: 5.2 mmol/L — AB (ref 3.5–5.1)
POTASSIUM: 4.8 mmol/L (ref 3.5–5.1)
POTASSIUM: 4.1 mmol/L (ref 3.5–5.1)
POTASSIUM: 4.6 mmol/L (ref 3.5–5.1)
Potassium: 4.2 mmol/L (ref 3.5–5.1)
Potassium: 4.4 mmol/L (ref 3.5–5.1)
Potassium: 4.5 mmol/L (ref 3.5–5.1)
Potassium: 4.1 mmol/L (ref 3.5–5.1)
SODIUM: 136 mmol/L (ref 135–145)
SODIUM: 136 mmol/L (ref 135–145)
SODIUM: 135 mmol/L (ref 135–145)
SODIUM: 137 mmol/L (ref 135–145)
SODIUM: 136 mmol/L (ref 135–145)
SODIUM: 138 mmol/L (ref 135–145)
SODIUM: 140 mmol/L (ref 135–145)
Sodium: 135 mmol/L (ref 135–145)
Sodium: 137 mmol/L (ref 135–145)
TCO2: 29 mmol/L (ref 0–100)
TCO2: 27 mmol/L (ref 0–100)
TCO2: 28 mmol/L (ref 0–100)
TCO2: 30 mmol/L (ref 0–100)
TCO2: 28 mmol/L (ref 0–100)
TCO2: 28 mmol/L (ref 0–100)
TCO2: 26 mmol/L (ref 0–100)
TCO2: 29 mmol/L (ref 0–100)
TCO2: 27 mmol/L (ref 0–100)

## 2016-07-14 LAB — CBC
HCT: 27 % — ABNORMAL LOW (ref 39.0–52.0)
HEMATOCRIT: 44.4 % (ref 39.0–52.0)
HEMATOCRIT: 38.2 % — AB (ref 39.0–52.0)
HEMOGLOBIN: 15.1 g/dL (ref 13.0–17.0)
HEMOGLOBIN: 12.9 g/dL — AB (ref 13.0–17.0)
Hemoglobin: 9.3 g/dL — ABNORMAL LOW (ref 13.0–17.0)
MCH: 31.4 pg (ref 26.0–34.0)
MCH: 31.2 pg (ref 26.0–34.0)
MCH: 30.8 pg (ref 26.0–34.0)
MCHC: 34 g/dL (ref 30.0–36.0)
MCHC: 33.8 g/dL (ref 30.0–36.0)
MCHC: 34.4 g/dL (ref 30.0–36.0)
MCV: 92.3 fL (ref 78.0–100.0)
MCV: 92.5 fL (ref 78.0–100.0)
MCV: 89.4 fL (ref 78.0–100.0)
PLATELETS: 88 10*3/uL — AB (ref 150–400)
Platelets: 133 10*3/uL — ABNORMAL LOW (ref 150–400)
Platelets: 91 10*3/uL — ABNORMAL LOW (ref 150–400)
RBC: 4.81 MIL/uL (ref 4.22–5.81)
RBC: 4.13 MIL/uL — AB (ref 4.22–5.81)
RBC: 3.02 MIL/uL — ABNORMAL LOW (ref 4.22–5.81)
RDW: 13 % (ref 11.5–15.5)
RDW: 12.9 % (ref 11.5–15.5)
RDW: 14.2 % (ref 11.5–15.5)
WBC: 7.8 10*3/uL (ref 4.0–10.5)
WBC: 18 10*3/uL — ABNORMAL HIGH (ref 4.0–10.5)
WBC: 12.9 10*3/uL — AB (ref 4.0–10.5)

## 2016-07-14 LAB — BASIC METABOLIC PANEL
Anion gap: 8 (ref 5–15)
BUN: 13 mg/dL (ref 6–20)
CALCIUM: 9.4 mg/dL (ref 8.9–10.3)
CHLORIDE: 100 mmol/L — AB (ref 101–111)
CO2: 27 mmol/L (ref 22–32)
CREATININE: 0.96 mg/dL (ref 0.61–1.24)
GFR calc Af Amer: >60 mL/min (ref >60)
GFR calc non Af Amer: >60 mL/min (ref >60)
GLUCOSE: 111 mg/dL — AB (ref 65–99)
Potassium: 4.2 mmol/L (ref 3.5–5.1)
Sodium: 135 mmol/L (ref 135–145)

## 2016-07-14 LAB — POCT I-STAT 3, ART BLOOD GAS (G3+)
Acid-Base Excess: 1 mmol/L (ref 0.0–2.0)
Acid-Base Excess: 3 mmol/L — ABNORMAL HIGH (ref 0.0–2.0)
Acid-Base Excess: 2 mmol/L (ref 0.0–2.0)
Acid-base deficit: 2 mmol/L (ref 0.0–2.0)
Bicarbonate: 26.5 mmol/L (ref 20.0–28.0)
Bicarbonate: 28.4 mmol/L — ABNORMAL HIGH (ref 20.0–28.0)
Bicarbonate: 27.5 mmol/L (ref 20.0–28.0)
Bicarbonate: 23.5 mmol/L (ref 20.0–28.0)
O2 SAT: 99 %
O2 Saturation: 100 %
O2 Saturation: 100 %
O2 Saturation: 99 %
PCO2 ART: 44.3 mmHg (ref 32.0–48.0)
PCO2 ART: 46.5 mmHg (ref 32.0–48.0)
PCO2 ART: 46.2 mmHg (ref 32.0–48.0)
PCO2 ART: 42 mmHg (ref 32.0–48.0)
PH ART: 7.386 (ref 7.350–7.450)
PH ART: 7.394 (ref 7.350–7.450)
PH ART: 7.351 (ref 7.350–7.450)
PO2 ART: 528 mmHg — AB (ref 83.0–108.0)
PO2 ART: 128 mmHg — AB (ref 83.0–108.0)
Patient temperature: 35.9
TCO2: 28 mmol/L (ref 0–100)
TCO2: 30 mmol/L (ref 0–100)
TCO2: 29 mmol/L (ref 0–100)
TCO2: 25 mmol/L (ref 0–100)
pH, Arterial: 7.382 (ref 7.350–7.450)
pO2, Arterial: 475 mmHg — ABNORMAL HIGH (ref 83.0–108.0)
pO2, Arterial: 123 mmHg — ABNORMAL HIGH (ref 83.0–108.0)

## 2016-07-14 LAB — ECHO INTRAOPERATIVE TEE
HEIGHTINCHES: 71 in
Weight: 3041.6 oz

## 2016-07-14 LAB — POCT I-STAT 4, (NA,K, GLUC, HGB,HCT)
GLUCOSE: 87 mg/dL (ref 65–99)
HCT: 23 % — ABNORMAL LOW (ref 39.0–52.0)
Hemoglobin: 7.8 g/dL — ABNORMAL LOW (ref 13.0–17.0)
Potassium: 4.9 mmol/L (ref 3.5–5.1)
Sodium: 139 mmol/L (ref 135–145)

## 2016-07-14 LAB — SURGICAL PCR SCREEN
MRSA, PCR: NEGATIVE
Staphylococcus aureus: NEGATIVE

## 2016-07-14 LAB — APTT
APTT: 44 s — AB (ref 24–36)
aPTT: 40 seconds — ABNORMAL HIGH (ref 24–36)

## 2016-07-14 LAB — HEMOGLOBIN AND HEMATOCRIT, BLOOD
HCT: 32.2 % — ABNORMAL LOW (ref 39.0–52.0)
Hemoglobin: 11.1 g/dL — ABNORMAL LOW (ref 13.0–17.0)

## 2016-07-14 LAB — PLATELET COUNT: PLATELETS: 123 10*3/uL — AB (ref 150–400)

## 2016-07-14 LAB — PROTIME-INR
INR: 1.61
INR: 1.79
PROTHROMBIN TIME: 21.1 s — AB (ref 11.4–15.2)
Prothrombin Time: 19.3 seconds — ABNORMAL HIGH (ref 11.4–15.2)

## 2016-07-14 LAB — PREPARE RBC (CROSSMATCH)

## 2016-07-14 SURGERY — ECHOCARDIOGRAM, TRANSESOPHAGEAL
Anesthesia: General | Site: Chest | Laterality: Right

## 2016-07-14 SURGERY — REPLACEMENT, AORTIC VALVE, MINIMALLY INVASIVE
Anesthesia: General | Site: Chest

## 2016-07-14 MED ORDER — VANCOMYCIN HCL IN DEXTROSE 1-5 GM/200ML-% IV SOLN
1000.0000 mg | Freq: Once | INTRAVENOUS | Status: DC
  Filled 2016-07-14: qty 200

## 2016-07-14 MED ORDER — INSULIN REGULAR BOLUS VIA INFUSION
0.0000 [IU] | Freq: Three times a day (TID) | INTRAVENOUS | Status: DC
  Filled 2016-07-14: qty 10

## 2016-07-14 MED ORDER — ASPIRIN EC 325 MG PO TBEC: 325.0000 mg | DELAYED_RELEASE_TABLET | Freq: Every day | ORAL | Status: DC

## 2016-07-14 MED ORDER — MAGNESIUM SULFATE 4 GM/100ML IV SOLN
4.0000 g | Freq: Once | INTRAVENOUS | Status: AC
  Administered 2016-07-14: 4 g via INTRAVENOUS
  Filled 2016-07-14: qty 100

## 2016-07-14 MED ORDER — DOCUSATE SODIUM 100 MG PO CAPS
200.0000 mg | ORAL_CAPSULE | Freq: Every day | ORAL | Status: DC
  Administered 2016-07-15 – 2016-07-19 (×4): 200 mg via ORAL
  Filled 2016-07-14 (×5): qty 2

## 2016-07-14 MED ORDER — METOPROLOL TARTRATE 25 MG/10 ML ORAL SUSPENSION: 12.5000 mg | Freq: Two times a day (BID) | ORAL | Status: DC

## 2016-07-14 MED ORDER — SODIUM CHLORIDE 0.9 % IV SOLN: Freq: Once | INTRAVENOUS | Status: DC

## 2016-07-14 MED ORDER — VANCOMYCIN HCL 10 G IV SOLR
1500.0000 mg | INTRAVENOUS | Status: DC
  Filled 2016-07-14: qty 1500

## 2016-07-14 MED ORDER — ALBUMIN HUMAN 5 % IV SOLN
INTRAVENOUS | Status: DC | PRN
  Administered 2016-07-14 (×2): via INTRAVENOUS

## 2016-07-14 MED ORDER — LACTATED RINGERS IV SOLN
INTRAVENOUS | Status: DC
  Administered 2016-07-14: 18:00:00 via INTRAVENOUS

## 2016-07-14 MED ORDER — FENTANYL CITRATE (PF) 250 MCG/5ML IJ SOLN
INTRAMUSCULAR | Status: DC | PRN
  Administered 2016-07-14: 250 ug via INTRAVENOUS
  Administered 2016-07-14: 100 ug via INTRAVENOUS
  Administered 2016-07-14: 250 ug via INTRAVENOUS
  Administered 2016-07-14: 400 ug via INTRAVENOUS

## 2016-07-14 MED ORDER — MORPHINE SULFATE (PF) 2 MG/ML IV SOLN
1.0000 mg | INTRAVENOUS | Status: DC | PRN
  Administered 2016-07-15: 2 mg via INTRAVENOUS
  Filled 2016-07-14 (×2): qty 1

## 2016-07-14 MED ORDER — SODIUM CHLORIDE 0.9 % IV SOLN: 30.0000 meq | Freq: Once | INTRAVENOUS | Status: DC

## 2016-07-14 MED ORDER — DEXMEDETOMIDINE HCL IN NACL 200 MCG/50ML IV SOLN
0.0000 ug/kg/h | INTRAVENOUS | Status: DC
  Administered 2016-07-15: 0.5 ug/kg/h via INTRAVENOUS
  Administered 2016-07-15: 0.3 ug/kg/h via INTRAVENOUS
  Filled 2016-07-14 (×2): qty 50

## 2016-07-14 MED ORDER — DOCUSATE SODIUM 100 MG PO CAPS: 200.0000 mg | ORAL_CAPSULE | Freq: Every day | ORAL | Status: DC

## 2016-07-14 MED ORDER — DEXTROSE 5 % IV SOLN
1.5000 g | INTRAVENOUS | Status: DC
  Filled 2016-07-14: qty 1.5

## 2016-07-14 MED ORDER — PROTAMINE SULFATE 10 MG/ML IV SOLN
INTRAVENOUS | Status: DC | PRN
  Administered 2016-07-14: 210 mg via INTRAVENOUS
  Administered 2016-07-14: 10 mg via INTRAVENOUS

## 2016-07-14 MED ORDER — BISACODYL 10 MG RE SUPP: 10.0000 mg | Freq: Every day | RECTAL | Status: DC

## 2016-07-14 MED ORDER — TRAMADOL HCL 50 MG PO TABS
50.0000 mg | ORAL_TABLET | ORAL | Status: DC | PRN
  Administered 2016-07-17 – 2016-07-18 (×3): 100 mg via ORAL
  Filled 2016-07-14 (×3): qty 2

## 2016-07-14 MED ORDER — ACETAMINOPHEN 500 MG PO TABS
1000.0000 mg | ORAL_TABLET | Freq: Four times a day (QID) | ORAL | Status: DC
  Administered 2016-07-15 – 2016-07-18 (×11): 1000 mg via ORAL
  Filled 2016-07-14 (×9): qty 2

## 2016-07-14 MED ORDER — MIDAZOLAM HCL 10 MG/2ML IJ SOLN
INTRAMUSCULAR | Status: AC
  Filled 2016-07-14: qty 2

## 2016-07-14 MED ORDER — FENTANYL CITRATE (PF) 250 MCG/5ML IJ SOLN
INTRAMUSCULAR | Status: AC
  Filled 2016-07-14: qty 20

## 2016-07-14 MED ORDER — OXYCODONE HCL 5 MG PO TABS: 5.0000 mg | ORAL_TABLET | ORAL | Status: DC | PRN

## 2016-07-14 MED ORDER — ALBUMIN HUMAN 5 % IV SOLN: 250.0000 mL | INTRAVENOUS | Status: AC | PRN

## 2016-07-14 MED ORDER — METOPROLOL TARTRATE 5 MG/5ML IV SOLN: 2.5000 mg | INTRAVENOUS | Status: DC | PRN

## 2016-07-14 MED ORDER — PLASMA-LYTE 148 IV SOLN
INTRAVENOUS | Status: AC
  Administered 2016-07-14: 500 mL
  Filled 2016-07-14: qty 2.5

## 2016-07-14 MED ORDER — PROPOFOL 10 MG/ML IV BOLUS
INTRAVENOUS | Status: DC | PRN
  Administered 2016-07-14: 60 mg via INTRAVENOUS

## 2016-07-14 MED ORDER — MORPHINE SULFATE (PF) 2 MG/ML IV SOLN
1.0000 mg | INTRAVENOUS | Status: DC | PRN
  Administered 2016-07-14: 4 mg via INTRAVENOUS
  Filled 2016-07-14: qty 2

## 2016-07-14 MED ORDER — ALBUMIN HUMAN 5 % IV SOLN
250.0000 mL | INTRAVENOUS | Status: DC | PRN
  Administered 2016-07-14 (×3): 250 mL via INTRAVENOUS

## 2016-07-14 MED ORDER — LACTATED RINGERS IV SOLN: INTRAVENOUS | Status: DC

## 2016-07-14 MED ORDER — TRANEXAMIC ACID 1000 MG/10ML IV SOLN
1.5000 mg/kg/h | INTRAVENOUS | Status: DC
  Filled 2016-07-14: qty 25

## 2016-07-14 MED ORDER — ACETAMINOPHEN 650 MG RE SUPP
650.0000 mg | Freq: Once | RECTAL | Status: DC
Start: 2016-07-14 — End: 2016-07-15

## 2016-07-14 MED ORDER — ORAL CARE MOUTH RINSE
15.0000 mL | Freq: Four times a day (QID) | OROMUCOSAL | Status: DC
  Administered 2016-07-15 – 2016-07-17 (×4): 15 mL via OROMUCOSAL

## 2016-07-14 MED ORDER — ROCURONIUM BROMIDE 10 MG/ML (PF) SYRINGE
PREFILLED_SYRINGE | INTRAVENOUS | Status: DC | PRN
  Administered 2016-07-14: 50 mg via INTRAVENOUS
  Administered 2016-07-14: 20 mg via INTRAVENOUS
  Administered 2016-07-14 (×2): 50 mg via INTRAVENOUS
  Administered 2016-07-14 (×4): 20 mg via INTRAVENOUS

## 2016-07-14 MED ORDER — ACETAMINOPHEN 160 MG/5ML PO SOLN: 650.0000 mg | Freq: Once | ORAL | Status: DC

## 2016-07-14 MED ORDER — CHLORHEXIDINE GLUCONATE 0.12 % MT SOLN
15.0000 mL | OROMUCOSAL | Status: AC
  Administered 2016-07-14: 15 mL via OROMUCOSAL

## 2016-07-14 MED ORDER — MORPHINE SULFATE (PF) 2 MG/ML IV SOLN
1.0000 mg | INTRAVENOUS | Status: AC | PRN
  Administered 2016-07-15 (×3): 4 mg via INTRAVENOUS
  Filled 2016-07-14 (×3): qty 2

## 2016-07-14 MED ORDER — FENTANYL CITRATE (PF) 250 MCG/5ML IJ SOLN
INTRAMUSCULAR | Status: AC
  Filled 2016-07-14: qty 5

## 2016-07-14 MED ORDER — DEXMEDETOMIDINE HCL IN NACL 200 MCG/50ML IV SOLN
0.0000 ug/kg/h | INTRAVENOUS | Status: DC
  Administered 2016-07-14: 0.7 ug/kg/h via INTRAVENOUS
  Administered 2016-07-14: 20:00:00 via INTRAVENOUS
  Filled 2016-07-14: qty 50

## 2016-07-14 MED ORDER — ACETAMINOPHEN 160 MG/5ML PO SOLN: 1000.0000 mg | Freq: Four times a day (QID) | ORAL | Status: DC

## 2016-07-14 MED ORDER — ACETAMINOPHEN 500 MG PO TABS: 1000.0000 mg | ORAL_TABLET | Freq: Four times a day (QID) | ORAL | Status: DC

## 2016-07-14 MED ORDER — SODIUM CHLORIDE 0.9% FLUSH
3.0000 mL | INTRAVENOUS | Status: DC | PRN
  Administered 2016-07-15: 20 mL via INTRAVENOUS
  Filled 2016-07-14: qty 3

## 2016-07-14 MED ORDER — CALCIUM CHLORIDE 10 % IV SOLN
INTRAVENOUS | Status: DC | PRN
  Administered 2016-07-14: 1 g via INTRAVENOUS

## 2016-07-14 MED ORDER — PHENYLEPHRINE 40 MCG/ML (10ML) SYRINGE FOR IV PUSH (FOR BLOOD PRESSURE SUPPORT)
PREFILLED_SYRINGE | INTRAVENOUS | Status: AC
  Filled 2016-07-14: qty 10

## 2016-07-14 MED ORDER — MAGNESIUM SULFATE 50 % IJ SOLN
40.0000 meq | INTRAMUSCULAR | Status: DC
  Filled 2016-07-14: qty 10

## 2016-07-14 MED ORDER — ASPIRIN 81 MG PO CHEW: 324.0000 mg | CHEWABLE_TABLET | Freq: Every day | ORAL | Status: DC

## 2016-07-14 MED ORDER — PROTAMINE SULFATE 10 MG/ML IV SOLN
INTRAVENOUS | Status: AC
  Filled 2016-07-14: qty 25

## 2016-07-14 MED ORDER — 0.9 % SODIUM CHLORIDE (POUR BTL) OPTIME
TOPICAL | Status: DC | PRN
  Administered 2016-07-14: 5000 mL

## 2016-07-14 MED ORDER — NITROGLYCERIN IN D5W 200-5 MCG/ML-% IV SOLN: 0.0000 ug/min | INTRAVENOUS | Status: DC

## 2016-07-14 MED ORDER — SODIUM CHLORIDE 0.9 % IV SOLN: 250.0000 mL | INTRAVENOUS | Status: DC

## 2016-07-14 MED ORDER — ONDANSETRON HCL 4 MG/2ML IJ SOLN: 4.0000 mg | Freq: Four times a day (QID) | INTRAMUSCULAR | Status: DC | PRN

## 2016-07-14 MED ORDER — SODIUM CHLORIDE 0.9 % IV SOLN: INTRAVENOUS | Status: DC

## 2016-07-14 MED ORDER — MAGNESIUM SULFATE 4 GM/100ML IV SOLN
4.0000 g | Freq: Once | INTRAVENOUS | Status: AC
  Administered 2016-07-14: 4 g via INTRAVENOUS

## 2016-07-14 MED ORDER — FAMOTIDINE IN NACL 20-0.9 MG/50ML-% IV SOLN
20.0000 mg | Freq: Two times a day (BID) | INTRAVENOUS | Status: AC
  Administered 2016-07-15: 20 mg via INTRAVENOUS
  Filled 2016-07-14: qty 50

## 2016-07-14 MED ORDER — TRAMADOL HCL 50 MG PO TABS: 50.0000 mg | ORAL_TABLET | ORAL | Status: DC | PRN

## 2016-07-14 MED ORDER — BISACODYL 5 MG PO TBEC: 10.0000 mg | DELAYED_RELEASE_TABLET | Freq: Every day | ORAL | Status: DC

## 2016-07-14 MED ORDER — SODIUM CHLORIDE 0.9 % IV SOLN
INTRAVENOUS | Status: DC
  Filled 2016-07-14: qty 30

## 2016-07-14 MED ORDER — METOPROLOL TARTRATE 12.5 MG HALF TABLET: 12.5000 mg | ORAL_TABLET | Freq: Two times a day (BID) | ORAL | Status: DC

## 2016-07-14 MED ORDER — CHLORHEXIDINE GLUCONATE 0.12 % MT SOLN
15.0000 mL | OROMUCOSAL | Status: AC
  Filled 2016-07-14: qty 15

## 2016-07-14 MED ORDER — BISACODYL 5 MG PO TBEC
10.0000 mg | DELAYED_RELEASE_TABLET | Freq: Every day | ORAL | Status: DC
  Administered 2016-07-15 – 2016-07-17 (×3): 10 mg via ORAL
  Filled 2016-07-14 (×4): qty 2

## 2016-07-14 MED ORDER — DEXTROSE 5 % IV SOLN
1.5000 g | Freq: Two times a day (BID) | INTRAVENOUS | Status: DC
  Filled 2016-07-14 (×2): qty 1.5

## 2016-07-14 MED ORDER — SODIUM CHLORIDE 0.9 % IR SOLN
Status: DC | PRN
  Administered 2016-07-14 (×3): 1000 mL

## 2016-07-14 MED ORDER — SODIUM CHLORIDE 0.9 % IV SOLN
30.0000 ug/min | INTRAVENOUS | Status: DC
  Filled 2016-07-14: qty 2

## 2016-07-14 MED ORDER — SODIUM CHLORIDE 0.9 % IV SOLN
Freq: Once | INTRAVENOUS | Status: AC
  Administered 2016-07-14: 18:00:00 via INTRAVENOUS

## 2016-07-14 MED ORDER — SODIUM CHLORIDE 0.9 % IV SOLN
0.0000 ug/min | INTRAVENOUS | Status: DC
  Administered 2016-07-14: 25 ug/min via INTRAVENOUS
  Filled 2016-07-14: qty 2

## 2016-07-14 MED ORDER — MIDAZOLAM HCL 5 MG/5ML IJ SOLN
INTRAMUSCULAR | Status: DC | PRN
  Administered 2016-07-14: 2 mg via INTRAVENOUS
  Administered 2016-07-14: 4 mg via INTRAVENOUS
  Administered 2016-07-14 (×4): 2 mg via INTRAVENOUS

## 2016-07-14 MED ORDER — SODIUM CHLORIDE 0.9% FLUSH: 3.0000 mL | INTRAVENOUS | Status: DC | PRN

## 2016-07-14 MED ORDER — SODIUM CHLORIDE 0.45 % IV SOLN: INTRAVENOUS | Status: DC | PRN

## 2016-07-14 MED ORDER — HEPARIN SODIUM (PORCINE) 1000 UNIT/ML IJ SOLN
INTRAMUSCULAR | Status: DC | PRN
  Administered 2016-07-14: 25000 [IU] via INTRAVENOUS

## 2016-07-14 MED ORDER — METOPROLOL TARTRATE 12.5 MG HALF TABLET
12.5000 mg | ORAL_TABLET | Freq: Two times a day (BID) | ORAL | Status: DC
  Administered 2016-07-16 – 2016-07-19 (×7): 12.5 mg via ORAL
  Filled 2016-07-14 (×8): qty 1

## 2016-07-14 MED ORDER — SODIUM CHLORIDE 0.9 % IV SOLN
INTRAVENOUS | Status: DC
  Filled 2016-07-14: qty 2.5

## 2016-07-14 MED ORDER — HEPARIN SODIUM (PORCINE) 1000 UNIT/ML IJ SOLN
INTRAMUSCULAR | Status: AC
  Filled 2016-07-14: qty 1

## 2016-07-14 MED ORDER — LACTATED RINGERS IV SOLN
INTRAVENOUS | Status: DC | PRN
  Administered 2016-07-14: 19:00:00 via INTRAVENOUS

## 2016-07-14 MED ORDER — CHLORHEXIDINE GLUCONATE 0.12% ORAL RINSE (MEDLINE KIT)
15.0000 mL | Freq: Two times a day (BID) | OROMUCOSAL | Status: DC
  Administered 2016-07-15 – 2016-07-16 (×4): 15 mL via OROMUCOSAL

## 2016-07-14 MED ORDER — FAMOTIDINE IN NACL 20-0.9 MG/50ML-% IV SOLN
20.0000 mg | Freq: Two times a day (BID) | INTRAVENOUS | Status: DC
  Administered 2016-07-14: 20 mg via INTRAVENOUS

## 2016-07-14 MED ORDER — DEXTROSE 5 % IV SOLN
INTRAVENOUS | Status: DC | PRN
  Administered 2016-07-14: 10 ug/min via INTRAVENOUS

## 2016-07-14 MED ORDER — ROCURONIUM BROMIDE 50 MG/5ML IV SOSY
PREFILLED_SYRINGE | INTRAVENOUS | Status: AC
  Filled 2016-07-14: qty 15

## 2016-07-14 MED ORDER — MIDAZOLAM HCL 2 MG/2ML IJ SOLN
2.0000 mg | INTRAMUSCULAR | Status: DC | PRN
  Administered 2016-07-14 (×2): 2 mg via INTRAVENOUS
  Filled 2016-07-14 (×2): qty 2

## 2016-07-14 MED ORDER — MORPHINE SULFATE (PF) 2 MG/ML IV SOLN: 1.0000 mg | INTRAVENOUS | Status: DC | PRN

## 2016-07-14 MED ORDER — TRANEXAMIC ACID (OHS) BOLUS VIA INFUSION
15.0000 mg/kg | INTRAVENOUS | Status: DC
  Filled 2016-07-14: qty 1293

## 2016-07-14 MED ORDER — TRANEXAMIC ACID (OHS) PUMP PRIME SOLUTION
2.0000 mg/kg | INTRAVENOUS | Status: DC
  Filled 2016-07-14: qty 1.72

## 2016-07-14 MED ORDER — VANCOMYCIN HCL 1000 MG IV SOLR
INTRAVENOUS | Status: DC
  Filled 2016-07-14: qty 1000

## 2016-07-14 MED ORDER — SODIUM CHLORIDE 0.9% FLUSH
3.0000 mL | Freq: Two times a day (BID) | INTRAVENOUS | Status: DC
  Administered 2016-07-15: 10:00:00 via INTRAVENOUS

## 2016-07-14 MED ORDER — CEFUROXIME SODIUM 750 MG IJ SOLR
750.0000 mg | INTRAMUSCULAR | Status: DC
  Filled 2016-07-14: qty 750

## 2016-07-14 MED ORDER — ASPIRIN EC 325 MG PO TBEC
325.0000 mg | DELAYED_RELEASE_TABLET | Freq: Every day | ORAL | Status: DC
  Administered 2016-07-15: 325 mg via ORAL
  Filled 2016-07-14: qty 1

## 2016-07-14 MED ORDER — PROPOFOL 10 MG/ML IV BOLUS
INTRAVENOUS | Status: AC
  Filled 2016-07-14: qty 40

## 2016-07-14 MED ORDER — ROCURONIUM BROMIDE 50 MG/5ML IV SOSY
PREFILLED_SYRINGE | INTRAVENOUS | Status: AC
  Filled 2016-07-14: qty 10

## 2016-07-14 MED ORDER — ACETAMINOPHEN 650 MG RE SUPP
650.0000 mg | Freq: Once | RECTAL | Status: AC
  Administered 2016-07-14: 650 mg via RECTAL
  Filled 2016-07-14: qty 1

## 2016-07-14 MED ORDER — POTASSIUM CHLORIDE 2 MEQ/ML IV SOLN
80.0000 meq | INTRAVENOUS | Status: DC
  Filled 2016-07-14: qty 40

## 2016-07-14 MED ORDER — NITROGLYCERIN IN D5W 200-5 MCG/ML-% IV SOLN
2.0000 ug/min | INTRAVENOUS | Status: DC
Start: 2016-07-14 — End: 2016-07-14
  Filled 2016-07-14: qty 250

## 2016-07-14 MED ORDER — ARTIFICIAL TEARS OP OINT
TOPICAL_OINTMENT | OPHTHALMIC | Status: AC
  Filled 2016-07-14: qty 3.5

## 2016-07-14 MED ORDER — SODIUM CHLORIDE 0.9 % IV SOLN
INTRAVENOUS | Status: DC
  Administered 2016-07-14: 18:00:00 via INTRAVENOUS

## 2016-07-14 MED ORDER — SODIUM CHLORIDE 0.45 % IV SOLN
INTRAVENOUS | Status: DC | PRN
  Administered 2016-07-14: 18:00:00 via INTRAVENOUS

## 2016-07-14 MED ORDER — LACTATED RINGERS IV SOLN
INTRAVENOUS | Status: DC | PRN
  Administered 2016-07-14 (×2): via INTRAVENOUS

## 2016-07-14 MED ORDER — SODIUM CHLORIDE 0.9 % IV SOLN: INTRAVENOUS | Status: AC

## 2016-07-14 MED ORDER — DOPAMINE-DEXTROSE 3.2-5 MG/ML-% IV SOLN
0.0000 ug/kg/min | INTRAVENOUS | Status: DC
  Filled 2016-07-14: qty 250

## 2016-07-14 MED ORDER — MIDAZOLAM HCL 2 MG/2ML IJ SOLN
INTRAMUSCULAR | Status: AC
  Filled 2016-07-14: qty 2

## 2016-07-14 MED ORDER — DEXMEDETOMIDINE HCL IN NACL 400 MCG/100ML IV SOLN
0.1000 ug/kg/h | INTRAVENOUS | Status: DC
Start: 2016-07-14 — End: 2016-07-14
  Filled 2016-07-14: qty 100

## 2016-07-14 MED ORDER — LACTATED RINGERS IV SOLN: 500.0000 mL | Freq: Once | INTRAVENOUS | Status: DC | PRN

## 2016-07-14 MED ORDER — VANCOMYCIN HCL 1000 MG IV SOLR
INTRAVENOUS | Status: AC
  Administered 2016-07-14: 1000 mL
  Filled 2016-07-14 (×2): qty 1000

## 2016-07-14 MED ORDER — PROPOFOL 10 MG/ML IV BOLUS
INTRAVENOUS | Status: AC
  Filled 2016-07-14: qty 20

## 2016-07-14 MED ORDER — DEXMEDETOMIDINE HCL IN NACL 200 MCG/50ML IV SOLN
INTRAVENOUS | Status: AC
  Filled 2016-07-14: qty 50

## 2016-07-14 MED ORDER — PROTAMINE SULFATE 10 MG/ML IV SOLN
25.0000 mg | Freq: Once | INTRAVENOUS | Status: AC
  Administered 2016-07-14: 25 mg via INTRAVENOUS
  Filled 2016-07-14: qty 5

## 2016-07-14 MED ORDER — 0.9 % SODIUM CHLORIDE (POUR BTL) OPTIME
TOPICAL | Status: DC | PRN
  Administered 2016-07-14: 1000 mL

## 2016-07-14 MED ORDER — SODIUM CHLORIDE 0.9 % IV SOLN
30.0000 meq | Freq: Once | INTRAVENOUS | Status: DC
  Filled 2016-07-14: qty 15

## 2016-07-14 MED ORDER — ROCURONIUM BROMIDE 100 MG/10ML IV SOLN
INTRAVENOUS | Status: DC | PRN
  Administered 2016-07-14: 100 mg via INTRAVENOUS

## 2016-07-14 MED ORDER — TRANEXAMIC ACID 1000 MG/10ML IV SOLN
INTRAVENOUS | Status: DC
  Filled 2016-07-14: qty 100

## 2016-07-14 MED ORDER — OXYCODONE HCL 5 MG PO TABS
5.0000 mg | ORAL_TABLET | ORAL | Status: DC | PRN
  Administered 2016-07-15 – 2016-07-16 (×4): 10 mg via ORAL
  Filled 2016-07-14 (×4): qty 2

## 2016-07-14 MED ORDER — SODIUM CHLORIDE 0.9 % IV SOLN
INTRAVENOUS | Status: DC
  Administered 2016-07-14: 17:00:00 via INTRAVENOUS

## 2016-07-14 MED ORDER — ACETAMINOPHEN 160 MG/5ML PO SOLN
650.0000 mg | Freq: Once | ORAL | Status: AC
  Filled 2016-07-14: qty 20.3

## 2016-07-14 MED ORDER — VANCOMYCIN HCL IN DEXTROSE 1-5 GM/200ML-% IV SOLN
1000.0000 mg | Freq: Once | INTRAVENOUS | Status: AC
  Administered 2016-07-15: 1000 mg via INTRAVENOUS
  Filled 2016-07-14: qty 200

## 2016-07-14 MED ORDER — PANTOPRAZOLE SODIUM 40 MG PO TBEC
40.0000 mg | DELAYED_RELEASE_TABLET | Freq: Every day | ORAL | Status: DC
  Administered 2016-07-16 – 2016-07-19 (×4): 40 mg via ORAL
  Filled 2016-07-14 (×5): qty 1

## 2016-07-14 MED ORDER — DEXTROSE 5 % IV SOLN
1.5000 g | Freq: Two times a day (BID) | INTRAVENOUS | Status: AC
  Administered 2016-07-15 – 2016-07-16 (×4): 1.5 g via INTRAVENOUS
  Filled 2016-07-14 (×4): qty 1.5

## 2016-07-14 MED ORDER — MIDAZOLAM HCL 5 MG/5ML IJ SOLN
INTRAMUSCULAR | Status: DC | PRN
  Administered 2016-07-14: 2 mg via INTRAVENOUS

## 2016-07-14 MED ORDER — PHENYLEPHRINE HCL 10 MG/ML IJ SOLN
INTRAMUSCULAR | Status: DC | PRN
  Administered 2016-07-14: 120 ug via INTRAVENOUS
  Administered 2016-07-14 (×2): 80 ug via INTRAVENOUS
  Administered 2016-07-14: 120 ug via INTRAVENOUS

## 2016-07-14 MED ORDER — PANTOPRAZOLE SODIUM 40 MG PO TBEC: 40.0000 mg | DELAYED_RELEASE_TABLET | Freq: Every day | ORAL | Status: DC

## 2016-07-14 MED ORDER — EPINEPHRINE PF 1 MG/ML IJ SOLN
0.0000 ug/min | INTRAVENOUS | Status: DC
  Filled 2016-07-14: qty 4

## 2016-07-14 MED ORDER — SODIUM CHLORIDE 0.9% FLUSH: 3.0000 mL | Freq: Two times a day (BID) | INTRAVENOUS | Status: DC

## 2016-07-14 MED ORDER — MIDAZOLAM HCL 2 MG/2ML IJ SOLN
2.0000 mg | INTRAMUSCULAR | Status: DC | PRN
  Administered 2016-07-15 (×2): 2 mg via INTRAVENOUS
  Filled 2016-07-14 (×2): qty 2

## 2016-07-14 MED ORDER — SODIUM CHLORIDE 0.9 % IV SOLN: 0.0000 ug/min | INTRAVENOUS | Status: DC

## 2016-07-14 MED ORDER — SODIUM CHLORIDE 0.9 % IV SOLN
INTRAVENOUS | Status: DC | PRN
  Administered 2016-07-14: 19:00:00 via INTRAVENOUS

## 2016-07-14 MED FILL — Magnesium Sulfate Inj 50%: INTRAMUSCULAR | Qty: 10 | Status: AC

## 2016-07-14 MED FILL — Potassium Chloride Inj 2 mEq/ML: INTRAVENOUS | Qty: 40 | Status: AC

## 2016-07-14 MED FILL — Heparin Sodium (Porcine) Inj 1000 Unit/ML: INTRAMUSCULAR | Qty: 30 | Status: AC

## 2016-07-14 SURGICAL SUPPLY — 122 items
ADAPTER CARDIO PERF ANTE/RETRO (ADAPTER) ×3 IMPLANT
ADAPTER MULTI PERFUSION 15 (ADAPTER) ×3 IMPLANT
BAG DECANTER FOR FLEXI CONT (MISCELLANEOUS) ×6 IMPLANT
BATTERY PACK STR FOR DRIVER (MISCELLANEOUS) ×6 IMPLANT
BLADE MICRO SAGITTAL (BLADE) ×3 IMPLANT
BLADE STERNUM SYSTEM 6 (BLADE) ×3 IMPLANT
CANISTER SUCTION 2500CC (MISCELLANEOUS) ×3 IMPLANT
CANNULA FEM VENOUS REMOTE 22FR (CANNULA) ×3 IMPLANT
CANNULA GUNDRY RCSP 15FR (MISCELLANEOUS) ×3 IMPLANT
CANNULA OPTISITE PERFUSION 16F (CANNULA) IMPLANT
CANNULA OPTISITE PERFUSION 18F (CANNULA) ×3 IMPLANT
CANNULA SUMP PERICARDIAL (CANNULA) ×6 IMPLANT
CATH ENDO VENT PULMONARY (CATHETERS) ×3 IMPLANT
CATH ENDOVENT PULMONARY (CATHETERS) IMPLANT
CATH HEART VENT LEFT (CATHETERS) ×4 IMPLANT
CATH RETROPLEGIA CORONARY 14FR (CATHETERS) ×3 IMPLANT
CELLS DAT CNTRL 66122 CELL SVR (MISCELLANEOUS) ×2 IMPLANT
CONN ST 1/4X3/8  BEN (MISCELLANEOUS) ×4
CONN ST 1/4X3/8 BEN (MISCELLANEOUS) ×8 IMPLANT
CONNECTOR 1/2X3/8X1/2 3 WAY (MISCELLANEOUS) ×1
CONNECTOR 1/2X3/8X1/2 3WAY (MISCELLANEOUS) ×2 IMPLANT
CONT SPEC 4OZ CLIKSEAL STRL BL (MISCELLANEOUS) ×3 IMPLANT
CONT SPEC STER OR (MISCELLANEOUS) ×3 IMPLANT
COVER BACK TABLE 24X17X13 BIG (DRAPES) ×3 IMPLANT
COVER MAYO STAND STRL (DRAPES) ×3 IMPLANT
CRADLE DONUT ADULT HEAD (MISCELLANEOUS) ×3 IMPLANT
DERMABOND ADVANCED (GAUZE/BANDAGES/DRESSINGS) ×2
DERMABOND ADVANCED .7 DNX12 (GAUZE/BANDAGES/DRESSINGS) ×4 IMPLANT
DEVICE PMI PUNCTURE CLOSURE (MISCELLANEOUS) ×3 IMPLANT
DEVICE TROCAR PUNCTURE CLOSURE (ENDOMECHANICALS) ×3 IMPLANT
DRAIN CHANNEL 28F RND 3/8 FF (WOUND CARE) ×6 IMPLANT
DRAIN CHANNEL 32F RND 10.7 FF (WOUND CARE) ×6 IMPLANT
DRAPE BILATERAL SPLIT (DRAPES) ×3 IMPLANT
DRAPE CV SPLIT W-CLR ANES SCRN (DRAPES) ×3 IMPLANT
DRAPE INCISE IOBAN 66X45 STRL (DRAPES) ×9 IMPLANT
DRAPE SLUSH/WARMER DISC (DRAPES) ×3 IMPLANT
DRSG COVADERM 4X10 (GAUZE/BANDAGES/DRESSINGS) ×3 IMPLANT
ELECT BLADE 4.0 EZ CLEAN MEGAD (MISCELLANEOUS) ×6
ELECT BLADE 6.5 EXT (BLADE) ×3 IMPLANT
ELECT REM PT RETURN 9FT ADLT (ELECTROSURGICAL) ×6
ELECTRODE BLDE 4.0 EZ CLN MEGD (MISCELLANEOUS) ×4 IMPLANT
ELECTRODE REM PT RTRN 9FT ADLT (ELECTROSURGICAL) ×4 IMPLANT
FELT TEFLON 1X6 (MISCELLANEOUS) ×9 IMPLANT
FEMORAL VENOUS CANN RAP (CANNULA) IMPLANT
GAUZE SPONGE 4X4 12PLY STRL (GAUZE/BANDAGES/DRESSINGS) ×3 IMPLANT
GLOVE BIOGEL M 6.5 STRL (GLOVE) ×12 IMPLANT
GLOVE BIOGEL M 7.0 STRL (GLOVE) ×6 IMPLANT
GLOVE BIOGEL M STER SZ 6 (GLOVE) ×15 IMPLANT
GLOVE BIOGEL PI IND STRL 6 (GLOVE) ×4 IMPLANT
GLOVE BIOGEL PI IND STRL 7.0 (GLOVE) ×6 IMPLANT
GLOVE BIOGEL PI INDICATOR 6 (GLOVE) ×2
GLOVE BIOGEL PI INDICATOR 7.0 (GLOVE) ×3
GLOVE ORTHO TXT STRL SZ7.5 (GLOVE) ×9 IMPLANT
GOWN STRL REUS W/ TWL LRG LVL3 (GOWN DISPOSABLE) ×8 IMPLANT
GOWN STRL REUS W/TWL LRG LVL3 (GOWN DISPOSABLE) ×4
KIT BASIN OR (CUSTOM PROCEDURE TRAY) ×3 IMPLANT
KIT CATH SUCT 8FR (CATHETERS) ×3 IMPLANT
KIT DILATOR VASC 18G NDL (KITS) ×3 IMPLANT
KIT DRAINAGE VACCUM ASSIST (KITS) ×3 IMPLANT
KIT ROOM TURNOVER OR (KITS) ×3 IMPLANT
KIT SUCTION CATH 14FR (SUCTIONS) ×3 IMPLANT
KIT SUT CK MINI COMBO 4X17 (Prosthesis & Implant Heart) ×9 IMPLANT
LEAD PACING MYOCARDI (MISCELLANEOUS) ×3 IMPLANT
LINE VENT (MISCELLANEOUS) ×3 IMPLANT
NEEDLE AORTIC ROOT 14G 7F (CATHETERS) ×3 IMPLANT
NS IRRIG 1000ML POUR BTL (IV SOLUTION) ×15 IMPLANT
PACK OPEN HEART (CUSTOM PROCEDURE TRAY) ×3 IMPLANT
PAD ARMBOARD 7.5X6 YLW CONV (MISCELLANEOUS) ×6 IMPLANT
PAD ELECT DEFIB RADIOL ZOLL (MISCELLANEOUS) ×3 IMPLANT
PLATE UNIVERSAL 8 HOLE (Plate) ×3 IMPLANT
RTRCTR WOUND ALEXIS 18CM MED (MISCELLANEOUS) ×3
SCREW SELF TAP MAT 2.9X14MM (Screw) ×12 IMPLANT
SET CANNULATION TOURNIQUET (MISCELLANEOUS) ×3 IMPLANT
SET CARDIOPLEGIA MPS 5001102 (MISCELLANEOUS) ×3 IMPLANT
SET IRRIG TUBING LAPAROSCOPIC (IRRIGATION / IRRIGATOR) ×3 IMPLANT
SOLUTION ANTI FOG 6CC (MISCELLANEOUS) ×3 IMPLANT
SPONGE GAUZE 4X4 12PLY STER LF (GAUZE/BANDAGES/DRESSINGS) ×6 IMPLANT
STOPCOCK 4 WAY LG BORE MALE ST (IV SETS) ×3 IMPLANT
SUT BONE WAX W31G (SUTURE) ×3 IMPLANT
SUT ETHIBOND NAB MH 2-0 36IN (SUTURE) ×12 IMPLANT
SUT ETHIBOND X763 2 0 SH 1 (SUTURE) ×45 IMPLANT
SUT GORETEX CV 4 TH 22 36 (SUTURE) ×3 IMPLANT
SUT GORETEX CV4 TH-18 (SUTURE) ×6 IMPLANT
SUT MNCRL AB 3-0 PS2 18 (SUTURE) ×6 IMPLANT
SUT PROLENE 3 0 SH 1 (SUTURE) ×12 IMPLANT
SUT PROLENE 3 0 SH DA (SUTURE) ×12 IMPLANT
SUT PROLENE 4 0 RB 1 (SUTURE) ×11
SUT PROLENE 4-0 RB1 .5 CRCL 36 (SUTURE) ×22 IMPLANT
SUT PROLENE 5 0 C 1 36 (SUTURE) ×6 IMPLANT
SUT PROLENE 6 0 C 1 30 (SUTURE) ×9 IMPLANT
SUT SILK  1 MH (SUTURE) ×8
SUT SILK 1 MH (SUTURE) ×16 IMPLANT
SUT SILK 1 TIES 10X30 (SUTURE) ×3 IMPLANT
SUT SILK 2 0 SH CR/8 (SUTURE) ×3 IMPLANT
SUT SILK 2 0 TIES 10X30 (SUTURE) ×3 IMPLANT
SUT SILK 2 0SH CR/8 30 (SUTURE) ×6 IMPLANT
SUT SILK 3 0 (SUTURE) ×1
SUT SILK 3 0 SH CR/8 (SUTURE) ×3 IMPLANT
SUT SILK 3 0 TIES 10X30 (SUTURE) ×3 IMPLANT
SUT SILK 3 0SH CR/8 30 (SUTURE) ×3 IMPLANT
SUT SILK 3-0 18XBRD TIE 12 (SUTURE) ×2 IMPLANT
SUT TEM PAC WIRE 2 0 SH (SUTURE) ×6 IMPLANT
SUT VIC AB 2-0 CTX 27 (SUTURE) ×6 IMPLANT
SUT VIC AB 2-0 CTX 36 (SUTURE) ×6 IMPLANT
SUT VIC AB 2-0 UR6 27 (SUTURE) IMPLANT
SUT VIC AB 3-0 SH 8-18 (SUTURE) ×12 IMPLANT
SYRINGE 10CC LL (SYRINGE) ×3 IMPLANT
SYSTEM SAHARA CHEST DRAIN ATS (WOUND CARE) ×3 IMPLANT
TAPE CLOTH SURG 4X10 WHT LF (GAUZE/BANDAGES/DRESSINGS) ×6 IMPLANT
TOWEL GREEN STERILE (TOWEL DISPOSABLE) ×12 IMPLANT
TOWEL GREEN STERILE FF (TOWEL DISPOSABLE) ×6 IMPLANT
TOWEL OR 17X24 6PK STRL BLUE (TOWEL DISPOSABLE) ×3 IMPLANT
TOWEL OR 17X26 10 PK STRL BLUE (TOWEL DISPOSABLE) ×3 IMPLANT
TRAY FOLEY IC TEMP SENS 16FR (CATHETERS) ×3 IMPLANT
TROCAR XCEL BLADELESS 5X75MML (TROCAR) IMPLANT
TROCAR XCEL NON-BLD 11X100MML (ENDOMECHANICALS) ×6 IMPLANT
TUBE SUCT INTRACARD DLP 20F (MISCELLANEOUS) ×3 IMPLANT
TUBE SUCTION CARDIAC 10FR (CANNULA) ×3 IMPLANT
UNDERPAD 30X30 (UNDERPADS AND DIAPERS) ×3 IMPLANT
VALVE SYSTEM EDWS INTUITY 23A (Prosthesis & Implant Heart) ×3 IMPLANT
VENT LEFT HEART 12002 (CATHETERS) ×6
WATER STERILE IRR 1000ML POUR (IV SOLUTION) ×6 IMPLANT

## 2016-07-14 SURGICAL SUPPLY — 80 items
ADAPTER CARDIO PERF ANTE/RETRO (ADAPTER) IMPLANT
BAG DECANTER FOR FLEXI CONT (MISCELLANEOUS) ×8 IMPLANT
BATTERY DRIVER MATRIXPRO (MISCELLANEOUS) IMPLANT
BATTERY MAXDRIVER (MISCELLANEOUS) IMPLANT
BENZOIN TINCTURE PRP APPL 2/3 (GAUZE/BANDAGES/DRESSINGS) ×4 IMPLANT
BLADE STERNUM SYSTEM 6 (BLADE) ×4 IMPLANT
CANISTER SUCTION 2500CC (MISCELLANEOUS) ×4 IMPLANT
CANNULA FEM VENOUS REMOTE 22FR (CANNULA) IMPLANT
CANNULA GUNDRY RCSP 15FR (MISCELLANEOUS) IMPLANT
CANNULA OPTISITE PERFUSION 16F (CANNULA) IMPLANT
CANNULA OPTISITE PERFUSION 18F (CANNULA) IMPLANT
CANNULA SUMP PERICARDIAL (CANNULA) IMPLANT
CATH ENDOVENT PULMONARY (CATHETERS) IMPLANT
CATH HEART VENT LEFT (CATHETERS) IMPLANT
CELLS DAT CNTRL 66122 CELL SVR (MISCELLANEOUS) ×3 IMPLANT
CHLORAPREP W/TINT 10.5 ML (MISCELLANEOUS) ×4 IMPLANT
CONN ST 1/4X3/8  BEN (MISCELLANEOUS) ×2
CONN ST 1/4X3/8 BEN (MISCELLANEOUS) ×6 IMPLANT
CONNECTOR 1/2X3/8X1/2 3 WAY (MISCELLANEOUS)
CONNECTOR 1/2X3/8X1/2 3WAY (MISCELLANEOUS) IMPLANT
CONT SPEC STER OR (MISCELLANEOUS) IMPLANT
COVER BACK TABLE 24X17X13 BIG (DRAPES) ×4 IMPLANT
CRADLE DONUT ADULT HEAD (MISCELLANEOUS) ×4 IMPLANT
DERMABOND ADVANCED (GAUZE/BANDAGES/DRESSINGS) ×1
DERMABOND ADVANCED .7 DNX12 (GAUZE/BANDAGES/DRESSINGS) ×3 IMPLANT
DEVICE PMI PUNCTURE CLOSURE (MISCELLANEOUS) IMPLANT
DEVICE TROCAR PUNCTURE CLOSURE (ENDOMECHANICALS) IMPLANT
DRAIN CHANNEL 28F RND 3/8 FF (WOUND CARE) IMPLANT
DRAPE BILATERAL SPLIT (DRAPES) ×4 IMPLANT
DRAPE CV SPLIT W-CLR ANES SCRN (DRAPES) ×4 IMPLANT
DRAPE INCISE IOBAN 66X45 STRL (DRAPES) ×12 IMPLANT
DRAPE SLUSH/WARMER DISC (DRAPES) ×4 IMPLANT
DRSG COVADERM 4X8 (GAUZE/BANDAGES/DRESSINGS) ×4 IMPLANT
ELECT BLADE 4.0 EZ CLEAN MEGAD (MISCELLANEOUS) ×4
ELECT BLADE 6.5 EXT (BLADE) ×4 IMPLANT
ELECT REM PT RETURN 9FT ADLT (ELECTROSURGICAL) ×8
ELECTRODE BLDE 4.0 EZ CLN MEGD (MISCELLANEOUS) ×3 IMPLANT
ELECTRODE REM PT RTRN 9FT ADLT (ELECTROSURGICAL) ×6 IMPLANT
FELT TEFLON 1X6 (MISCELLANEOUS) ×4 IMPLANT
FEMORAL VENOUS CANN RAP (CANNULA) IMPLANT
GAUZE SPONGE 4X4 12PLY STRL (GAUZE/BANDAGES/DRESSINGS) ×4 IMPLANT
GLOVE BIO SURGEON STRL SZ 6.5 (GLOVE) ×4 IMPLANT
GLOVE BIO SURGEON STRL SZ7.5 (GLOVE) ×4 IMPLANT
GLOVE ORTHO TXT STRL SZ7.5 (GLOVE) ×4 IMPLANT
GOWN STRL REUS W/ TWL LRG LVL3 (GOWN DISPOSABLE) ×12 IMPLANT
GOWN STRL REUS W/TWL LRG LVL3 (GOWN DISPOSABLE) ×4
KIT BASIN OR (CUSTOM PROCEDURE TRAY) ×4 IMPLANT
KIT CATH SUCT 8FR (CATHETERS) IMPLANT
KIT DILATOR VASC 18G NDL (KITS) IMPLANT
KIT ROOM TURNOVER OR (KITS) ×4 IMPLANT
KIT SUCTION CATH 14FR (SUCTIONS) IMPLANT
LEAD PACING MYOCARDI (MISCELLANEOUS) IMPLANT
NEEDLE AORTIC ROOT 14G 7F (CATHETERS) IMPLANT
NS IRRIG 1000ML POUR BTL (IV SOLUTION) ×20 IMPLANT
PACK OPEN HEART (CUSTOM PROCEDURE TRAY) ×4 IMPLANT
PAD ARMBOARD 7.5X6 YLW CONV (MISCELLANEOUS) ×8 IMPLANT
PAD ELECT DEFIB RADIOL ZOLL (MISCELLANEOUS) ×4 IMPLANT
RTRCTR WOUND ALEXIS 18CM MED (MISCELLANEOUS) ×4
SET CANNULATION TOURNIQUET (MISCELLANEOUS) IMPLANT
SET IRRIG TUBING LAPAROSCOPIC (IRRIGATION / IRRIGATOR) ×4 IMPLANT
SOLUTION ANTI FOG 6CC (MISCELLANEOUS) IMPLANT
SUT BONE WAX W31G (SUTURE) IMPLANT
SUT E-PACK MINIMALLY INVASIVE (SUTURE) ×4 IMPLANT
SUT GORETEX CV 4 TH 22 36 (SUTURE) IMPLANT
SUT GORETEX CV4 TH-18 (SUTURE) IMPLANT
SUT TEM PAC WIRE 2 0 SH (SUTURE) ×8 IMPLANT
SUT VIC AB 2-0 CTX 27 (SUTURE) ×4 IMPLANT
SYRINGE 10CC LL (SYRINGE) IMPLANT
SYSTEM SAHARA CHEST DRAIN ATS (WOUND CARE) IMPLANT
TOWEL GREEN STERILE (TOWEL DISPOSABLE) ×4 IMPLANT
TOWEL GREEN STERILE FF (TOWEL DISPOSABLE) ×4 IMPLANT
TOWEL OR 17X24 6PK STRL BLUE (TOWEL DISPOSABLE) ×4 IMPLANT
TOWEL OR 17X26 10 PK STRL BLUE (TOWEL DISPOSABLE) ×4 IMPLANT
TRAY FOLEY IC TEMP SENS 16FR (CATHETERS) IMPLANT
TROCAR XCEL BLADELESS 5X75MML (TROCAR) IMPLANT
TROCAR XCEL NON-BLD 11X100MML (ENDOMECHANICALS) IMPLANT
TUBE SUCT INTRACARD DLP 20F (MISCELLANEOUS) IMPLANT
UNDERPAD 30X30 (UNDERPADS AND DIAPERS) IMPLANT
VENT LEFT HEART 12002 (CATHETERS)
WATER STERILE IRR 1000ML POUR (IV SOLUTION) IMPLANT

## 2016-07-14 NOTE — Transfer of Care (Signed)
Immediate Anesthesia Transfer of Care Note  Patient: Jerry Odonnell  Procedure(s) Performed: Procedure(s): TRANSESOPHAGEAL ECHOCARDIOGRAM (TEE) (N/A) REEXPLORATION OF RIGHT THORACOTOMY FOR BLEEDING (Right)  Patient Location: SICU  Anesthesia Type:General  Level of Consciousness: Patient remains intubated per anesthesia plan  Airway & Oxygen Therapy: Patient remains intubated per anesthesia plan and Patient placed on Ventilator (see vital sign flow sheet for setting)  Post-op Assessment: Report given to RN and Post -op Vital signs reviewed and stable  Post vital signs: Reviewed and stable  Last Vitals:  Vitals:   07/14/16 1905 07/14/16 2207  BP: (!) 83/57   Pulse: 80 90  Resp: 11 (!) 22  Temp: (!) 35.9 C     Last Pain:  Vitals:   07/14/16 1832  TempSrc: Core (Comment)  PainSc:          Complications: No apparent anesthesia complications

## 2016-07-14 NOTE — OR Nursing (Addendum)
SICU notified that Dr. Cornelius Moraswen was closing at 2055. Thirty minute call. [place] to [place].

## 2016-07-14 NOTE — Progress Notes (Signed)
Hold Lopresor for [employer]. [id number] R.N. aware

## 2016-07-14 NOTE — Progress Notes (Signed)
      [address]       [place] [id number]             [phone]      S/p AVR  BP 124/71   Pulse 80   Temp (!) 85.1 F (29.5 C)   Resp 19   Ht 5\' 11"  (1.803 m)   Wt 190 lb 1.6 oz (86.2 kg)   SpO2 100%   BMI 26.51 kg/m    Intake/Output Summary (Last 24 hours) at 07/14/16 1815 Last data filed at 07/14/16 1805  Gross per 24 hour  Intake             3915 ml  Output             1660 ml  Net             2255 ml   Bleeding in early postop period  Ptt 40, INR 1.6 PLT = 91K  Correcting coags May need to go back to OR  Eden IsleSteven C. Dorris FetchHendrickson, MD [facility] [phone]

## 2016-07-14 NOTE — Op Note (Addendum)
CARDIOTHORACIC SURGERY OPERATIVE NOTE  Date of Procedure:   07/14/2016  Preoperative Diagnosis:  Bleeding s/p minimally invasive aortic valve replacement via right mini thoracotomy  Postoperative Diagnosis:  same  Procedure:    Reexploration of right mini thoracotomy for bleeding  Surgeon:    Salvatore Decent. Cornelius Moras, MD  Assistant:    Tanda Rockers, CRNFA  Anesthesia:    Germaine Pomfret, MD and Arrie Aran, MD  Operative Findings:   Bleeding from right anterior chest wall at site of chest tube placement     BRIEF CLINICAL NOTE AND INDICATIONS FOR SURGERY  Patient is a 61 year old male who underwent minimally invasive aortic valve replacement via right mini anterior thoracotomy on 07/14/2016. The procedure was uncomplicated but following arrival in the surgical intensive care unit the patient was noted to have consistently elevated chest tube output. Greater than 600 mL of sanguinous output was noted during the first 2 hours. The patient remained entirely hemodynamically stable throughout that period time. Admission blood work was notable for mild coagulopathy and thrombocytopenia. The patient was transfused 2 units of platelets and 2 units of fresh frozen plasma to treat coagulopathy. The patient's daughter was appraised of the circumstances and plans were made to bring the patient directly back to the operating for surgical reexploration.     DETAILS OF THE OPERATIVE PROCEDURE  The patient is brought to the operating room on the above mentioned date and placed in the supine position on the operating table.general endotracheal anesthesia is maintained in his care and direction of Dr. Jean Rosenthal. Transesophageal echocardiogram is performed. There is no blood in the pericardial space. Left ventricular function appears normal. Aortic valve function appears normal. Intravenous and box are administered. The patient's anterior chest is prepared and draped in a sterile manner.  A time out procedure  is performed. The patient's previous right anterior mini thoracotomy incision is reopened. The plate reattaching the third costal cartilage to the sternum is removed. A retractor is placed in the right chest examined. There is a fair amount of blood and clot in the right pleural space. All of the blood and clot is evacuated. A thorough source for bleeding is entertained. Initially the aortotomy is carefully examined, and it appears perfectly intact. The sites where the retrograde cardioplegia cannula and antegrade cardioplegia cannula and left ventricular vent had been placed for all inspected and notably intact. There does not appear to be any bleeding coming from the heart or the mediastinum. Using intermittent periods of apnea the lung is collapsed to facilitate thorough evaluation of the chest. Ultimately became clear the bleeding seemed to be streaming down from the anterior chest wall along the site where the anterior chest tube had been placed. The anterior chest tube was subsequently removed. The small incision where the chest tube had been was extended in both directions and explored. There is bleeding noted from the breast tissue in this area that is quite brisk. This is controlled using electrocautery. The incision is continued until the intercostal space is clearly exposed. There is no bleeding noted from the intercostal artery and vein, and bleeding all seemed to be emanating from the pectoralis fascia and breast tissue. The chest is again reexamined and irrigated using copious warm saline solution. An exhaustive search is completed and no further sites of mechanical bleeding are identified. A second chest tube is replaced to drain the anterior medial pleural space and mediastinum after tunneling it through a separate skin incision. The extended anterior chest incision where the  bleeding had been noted is subsequent closed in multiple layers in routine fashion. The right third costal cartilages  reattached to the sternum using the same Synthes plate and screws that had been removed previously. The soft tissues anterior to the third costal cartilage are closed in multiple layers and the skin is closed with a subcuticular skin closure.  The patient tolerated the procedure well and is transported back to the surgical intensive care unit in stable condition. There are no intraoperative, occasions. The patient was transfused 2 units of packed red blood cells during the procedure for acute blood loss anemia. All sponges, instrument and needle counts are verified correct at completion the operation. There were no intraoperative consultations.     Salvatore Decentlarence H. Cornelius Moraswen MD 07/14/2016 9:24 PM

## 2016-07-14 NOTE — Anesthesia Postprocedure Evaluation (Signed)
Anesthesia Post Note  Patient: Jerry Odonnell  Procedure(s) Performed: Procedure(s) (LRB): TRANSESOPHAGEAL ECHOCARDIOGRAM (TEE) (N/A) REEXPLORATION OF RIGHT THORACOTOMY FOR BLEEDING (Right)  Patient location during evaluation: SICU Anesthesia Type: General Level of consciousness: sedated and patient remains intubated per anesthesia plan Pain management: pain level controlled Vital Signs Assessment: post-procedure vital signs reviewed and stable Respiratory status: patient remains intubated per anesthesia plan Cardiovascular status: stable Anesthetic complications: no       Last Vitals:  Vitals:   07/14/16 2300 07/14/16 2315  BP: 105/67   Pulse: 80 80  Resp: 19 13  Temp: (!) 35.9 C (!) 35.9 C    Last Pain:  Vitals:   07/14/16 1832  TempSrc: Core (Comment)  PainSc:                  Cecile HearingStephen Edward Turk

## 2016-07-14 NOTE — OR Nursing (Signed)
Fifteen minute call to SICU charge nurse at 2119. Spoke to [place].

## 2016-07-14 NOTE — OR Nursing (Signed)
15:45 - 20 minute call to SICU charge nurse

## 2016-07-14 NOTE — Transfer of Care (Signed)
Immediate Anesthesia Transfer of Care Note  Patient: Jerry Odonnell  Procedure(s) Performed: Procedure(s): MINIMALLY INVASIVE AORTIC VALVE REPLACEMENT (AVR) (N/A) TRANSESOPHAGEAL ECHOCARDIOGRAM (TEE) (N/A)  Patient Location: SICU  Anesthesia Type:General  Level of Consciousness: Patient remains intubated per anesthesia plan  Airway & Oxygen Therapy: Patient remains intubated per anesthesia plan and Patient placed on Ventilator (see vital sign flow sheet for setting)  Post-op Assessment: Report given to RN and Post -op Vital signs reviewed and stable  Post vital signs: Reviewed and stable  Last Vitals:  Vitals:   07/14/16 0407 07/14/16 0500  BP: 129/62   Pulse: 63 (!) 56  Resp: 18   Temp: 36.6 C     Last Pain:  Vitals:   07/14/16 0407  TempSrc: Oral  PainSc:          Complications: No apparent anesthesia complications

## 2016-07-14 NOTE — Anesthesia Procedure Notes (Signed)
Procedure Name: Intubation Date/Time: 07/14/2016 8:59 AM Performed by: Gwenyth AllegraADAMI, Aubreana Cornacchia Pre-anesthesia Checklist: Patient identified, Emergency Drugs available, Suction available, Patient being monitored and Timeout performed Patient Re-evaluated:Patient Re-evaluated prior to inductionOxygen Delivery Method: Circle system utilized Preoxygenation: Pre-oxygenation with 100% oxygen Intubation Type: IV induction Ventilation: Mask ventilation without difficulty and Oral airway inserted - appropriate to patient size Laryngoscope Size: Glidescope and 4 Grade View: Grade III Endobronchial tube: Left, Double lumen EBT, EBT position confirmed by auscultation and EBT position confirmed by fiberoptic bronchoscope and 39 Fr Number of attempts: 2 Airway Equipment and Method: Video-laryngoscopy Placement Confirmation: ETT inserted through vocal cords under direct vision,  positive ETCO2 and breath sounds checked- equal and bilateral Tube secured with: Tape Dental Injury: Teeth and Oropharynx as per pre-operative assessment  Difficulty Due To: Difficulty was unanticipated, Difficult Airway- due to immobile epiglottis and Difficult Airway- due to anterior larynx

## 2016-07-14 NOTE — Anesthesia Procedure Notes (Signed)
Central Venous Catheter Insertion Performed by: Annye Asa, anesthesiologist Start/End2/21/2018 7:56 AM, 07/14/2016 8:10 AM Patient location: Pre-op. Preanesthetic checklist: patient identified, IV checked, site marked, risks and benefits discussed, surgical consent, monitors and equipment checked, pre-op evaluation, timeout performed and anesthesia consent Position: Trendelenburg Lidocaine 1% used for infiltration and patient sedated Hand hygiene performed , maximum sterile barriers used  and Seldinger technique used Catheter size: 8.5 Fr PA cath was placed.Sheath introducer Swan type:thermodilution Procedure performed using ultrasound guided technique. Ultrasound Notes:anatomy identified, needle tip was noted to be adjacent to the nerve/plexus identified, no ultrasound evidence of intravascular and/or intraneural injection and image(s) printed for medical record Attempts: 1 Following insertion, line sutured and dressing applied. Post procedure assessment: blood return through all ports, free fluid flow and no air  Patient tolerated the procedure well with no immediate complications. Additional procedure comments: PA catheter:  Routine monitors. Timeout, sterile prep, drape, FBP L neck.  Trendelenburg position.  1% Lido local, finder and trocar LIJ 1st pass with US guidance.  Cordis placed over J wire. PA catheter in easily.  Sterile dressing applied.  Patient tolerated well, VSS.  Jenita Seashore, MD.

## 2016-07-14 NOTE — Progress Notes (Addendum)
Patient arrived from OR with bleeding from chest tubes exceeding parameters. MD contacted. Dr. Cornelius Moraswen at bedside. Swan not reading correctly, manipulated at bedside by MD. Multiple blood products given. Multiple albumins given. Patient returned to OR with MD at bedside.

## 2016-07-14 NOTE — Op Note (Signed)
CARDIOTHORACIC SURGERY OPERATIVE NOTE  Date of Procedure:  07/14/2016  Preoperative Diagnosis: Severe Aortic Stenosis   Postoperative Diagnosis: Same   Procedure:    Minimally Invasive Aortic Valve Replacement   Right Anterior Mini Thoracotomy Approach  Edwards Intuity Elite Rapid Deployment Pericardial Tissue Valve (size 43m, model #8300AB, serial #[id number]    Surgeon: CValentina Gu ORoxy Manns MD  Assistant: TNicholes Rough PA-C  Anesthesia: EMidge Minium MD  Operative Findings:  Bicuspid native aortic valve with severe aortic stenosis  Normal LV systolic function  Moderate LV hypertrophy             BRIEF CLINICAL NOTE AND INDICATIONS FOR SURGERY  Patient is a 61year old male with recently discovered critical aortic stenosis who was admitted to the hospital following a syncopal event and has been referred for second surgical opinion to discuss treatment options for management of aortic stenosis. The patient states that he has known he had a heart murmur for many years. Approximately 14 years ago he was referred for cardiology evaluation because of an abnormal EKG and underwent a stress test. An echocardiogram was apparently not performed at that time. The patient was felt to be low risk and no further etiology follow-up was arranged. Last fall the patient returned to see his primary care physician for routine examination. At that time the patient was told that the character of his heart murmur had changed and a routine echocardiogram was ordered. [facility] By report this echocardiogram demonstrated the presence of critical aortic stenosis with peak velocity across the aortic valve measured 5.4 m/s corresponding to mean transvalvular gradient estimated 86 mmHg. Left ventricular systolic function remained normal with ejection fraction estimated 60%. The patient discuss  these findings [facility] to seek a second opinion. He was evaluated last week by Dr. BGwenlyn Foundand plans were made for elective diagnostic cardiac catheterization later this week.  However, on February 16 the patient was walking with a friend and suffered a syncopal episode. He states that they were walking at a ordinary patient up a hill and he began to feel dizzy. They decided to stop and turned around but he began to feel worse. He ultimately sat down on the grass and lost consciousness for over a minute. EMS was summoned and he was admitted directly to the hospital. Diagnostic cardiac catheterization was performed demonstrating minimal nonobstructive coronary artery disease and normal right-sided pressures. No attempt was made to cross the aortic valve. Cardiothoracic surgical consultation was requested and the patient was evaluated by Dr. BCyndia Bent Surgical options were discussed and a second opinion requested to discuss the possibility of minimally invasive approach for surgery. The patient has been seen in consultation and counseled at length regarding the indications, risks and potential benefits of surgery.  All questions have been answered, and the patient provides full informed consent for the operation as described.    DETAILS OF THE OPERATIVE PROCEDURE  Preparation:  The patient is brought to the operating room on the above mentioned date and central monitoring was established by the anesthesia team including placement of Swan-Ganz catheter through the left internal jugular vein.  A radial arterial line is placed. The patient is placed in the supine position on the operating table.  Intravenous antibiotics are administered. General endotracheal anesthesia is induced uneventfully. The patient is initially intubated using a dual lumen endotracheal tube.  A Foley catheter is placed.  Baseline transesophageal echocardiogram was performed.  Findings were notable  for the presence of critical aortic stenosis. The aortic valve was heavily calcified. Peak velocity across the aortic valve measured greater than 5 m/s. There was mild aortic insufficiency. There was normal left ventricular systolic function. There was moderate left ventricular hypertrophy. There was trace central mitral regurgitation.  The patient is placed in the supine position with their neck gently extended and turned to the left.   The patient's right neck, chest, abdomen, both groins, and both lower extremities are prepared and draped in a sterile manner. A time out procedure is performed.   Surgical Approach:  A right miniature anteriorthoracotomy incision is performed. The incision is placed immediately over the 3rd intercostal space.  The muscle fibers of the pectoralis major muscle are split longitudinally and the right pleural space is entered through the 3rd intercostal space. The 3rd costal cartilage is divided at it's insertion onto the sternum.  The right internal mammary artery and veins are ligated and divided.  A soft tissue retractor is placed.  Two 11 mm ports are placed through separate stab incisions inferiorly. The right pleural space is insufflated continuously with carbon dioxide gas through the posterior port during the remainder of the operation.     Extracorporeal Cardiopulmonary Bypass and Myocardial Protection:  A small incision is made in the right inguinal crease and the anterior surface of the right common femoral artery and right common femoral vein are identified.  The patient is placed in Trendelenburg position. The right internal jugular vein is cannulated with Seldinger technique and a guidewire advanced into the right atrium. A second guidewire is placed using a Seldinger technique and an Edwards Endovent introducer sheath was advanced over the guidewire into the internal jugular vein. The patient is heparinized systemically.  The Endovent catheter was then passed  through the introducing sheath and floated through the right atrium, through the right ventricle, and into the proximal pulmonary artery while continuously monitoring pressure from the distal tip.  A 14 French pediatric femoral venous cannula is placed through the right internal jugular vein into the superior vena cava. Pursestring sutures are placed on the anterior surface of the right common femoral vein and right common femoral artery. The right common femoral vein is cannulated with the Seldinger technique and a guidewire is advanced under transesophageal echocardiogram guidance through the right atrium. The femoral vein is cannulated with a long 22 French femoral venous cannula. The right common femoral artery is cannulated with Seldinger technique and a flexible guidewire is advanced until it can be appreciated intraluminally in the descending thoracic aorta on transesophageal echocardiogram. The femoral artery is cannulated with an 18 French femoral arterial cannula.  Adequate heparinization is verified.      The entire pre-bypass portion of the operation was notable for stable hemodynamics.  Cardiopulmonary bypass was begun.  Vacuum assist venous drainage is utilized. An incision is made in the pericardium over the ascending aorta and extended in both directions. Silk traction sutures are placed to retract the pericardium.  Venous drainage and exposure are notably excellent. A retrograde cardioplegia cannula is placed through the right atrium into the coronary sinus using transesophageal echocardiogram guidance.  A left ventricular vent is placed through the right superior pulmonary vein.  An antegrade cardioplegia cannula is placed in the ascending aorta.    The patient is cooled to 32C systemic temperature.  The aortic cross clamp is applied and cold blood cardioplegia is delivered initially in an  antegrade fashion through the aortic root.  Supplemental cardioplegia is given retrograde through the  coronary sinus catheter.  However, the retrograde catheter does not respond with normal pressure rise suggesting that the catheter became dislodged from the coronary sinus. Further administration of retrograde cardioplegia is discontinued.. The initial cardioplegic arrest is rapid with early diastolic arrest.  Repeat doses of cardioplegia are administered intermittently every 20 to 30 minutes throughout the entire cross clamp portion of the operation using hand-held Spencer cardioplegia cannulas directly into the left main coronary ostium.  Myocardial protection was felt to be excellent.   Aortic Valve Replacement:  An oblique transverse aortotomy incision was performed.  The aortic valve was inspected and notable for critical aortic stenosis with abicuspid aortic valve (Sievers type I) with a single raphe between the left and right leaflets.  The aortic valve leaflets were excised sharply and the aortic annulus decalcified.  Decalcification was notably extensive and tedious.  The noncoronary leaflet was essentially completely replaced with a very large ball of calcium. This calcium extended into the annulus in the midportion underneath the non-coronary leaflet, particularly towards the commissure between the left and the noncoronary leaflet.  Once decalcification was completed, the annulus was repaired in this location using several erupted 2-0 Ethibond horizontal mattress non-pledgeted sutures. The aortic annulus was sized to accept a 23 mm prosthesis.  The aortic root and left ventricle were irrigated with copious cold saline solution.  Aortic valve replacement was performed using an [employer] rapid deployment pericardial tissue valve (size 23 mm, model #8300AB, serial # [id number]).  The valve was rinsed in saline for manufacture recommendations. A total of 8 individual guiding sutures are placed through the aortic annulus, including 2 sutures on either side of the nadir of the sinus of Valsalva  beneath the left main and the right coronary arteries, and a total of 4 sutures along the annulus beneath the noncoronary sinus of Valsalva.  The valve was attached to the delivery device and the guiding sutures placed through the valve sewing cuff. The valve is lowered into place. Care is taken to insure that the valve is completely seated in the annulus. Each of the guiding sutures were secured using Cor-knot clips. The valve stent is deployed by inflating the deployment balloon to 4.5 atm pressure and holding for 10 seconds. The balloon is deflated the valve holder sutures cut In the deployment system removed.   The valve is carefully inspected to make sure that it is seated appropriately. Endoscopic visualization through the valve is utilized to inspect the left ventricular outflow tract. Aortic root was filled with saline to make sure the valve is competent. Rewarming is begun.   Procedure Completion:  The aortotomy was closed using a 2-layer closure of running 4-0 Prolene suture.  All air was evacuated through the aortic root.  The aortic cross clamp was removed after a total cross clamp time of 120 minutes.  Initially there was some bleeding close to the aortotomy and the cross-clamp was briefly replaced. This area of bleeding was repaired using a single pledgeted suture. The aortic cross-clamp was again removed after an additional 7 minutes, such that the grand total cross-clamp time for the operation was 127 minutes.  Epicardial pacing wires are fixed to the right ventricular outflow tract and to the right atrial appendage. The patient is rewarmed to 37C temperature.  The pericardial sac was drained using a 28 French Bard drain placed through the anterior port incision.  The left ventricular  vent was removed. The patient is weaned and disconnected from cardiopulmonary bypass.  The patient's rhythm at separation from bypass was AV paced.  The patient was weaned from bypass without any inotropic  support. Total cardiopulmonary bypass time for the operation was 207 minutes.  Followup transesophageal echocardiogram performed after separation from bypass revealed a well-seated aortic valve prosthesis that was functioning normally and without any sign of perivalvular leak.  Deep transgastric views are obtained from multiple angles to make certain the valve is well-seated.  Mean transvalvular gradient across the valve is estimated 8 mmHg.  Left ventricular function was unchanged from preoperatively.  The femoral arterial and venous cannulae were removed uneventfully. There was a palpable pulse in the distal right common femoral artery after removal of the cannula.  The Endovent catheter was removed.   Protamine was administered to reverse the anticoagulation.   The right internal jugular venous cannula is removed and manual pressure held for 15 minutes.  Single lung ventilation was begun. The aortotomy closure was inspected for hemostasis. The right pleural space is irrigated with saline solution and inspected for hemostasis. The right pleural space was drained using a 28 French Bard drain placed through the posterior port incision. The 3rd costal cartilage is replaced using a small universal Synthes plate with a total of 4 screws.  The miniature thoracotomy incision was closed in multiple layers in routine fashion. The right groin incision was inspected for hemostasis and closed in multiple layers in routine fashion.   The post-bypass portion of the operation was notable for stable rhythm and hemodynamics.   No blood products were administered during the operation.   Disposition:  The patient tolerated the procedure well.  The patient was reintubated using a single lumen endotracheal tube and subsequently transported to the surgical intensive care unit in stable condition. There were no intraoperative complications. All sponge instrument and needle counts are verified correct at completion of the  operation.     Valentina Gu. Roxy Manns MD 07/14/2016 3:59 PM

## 2016-07-14 NOTE — Progress Notes (Signed)
Home meds down in main pharm. And I pad and Cell phone and glasses lock up in security safe.See Papers in front of chart.

## 2016-07-14 NOTE — Anesthesia Postprocedure Evaluation (Addendum)
Anesthesia Post Note  Patient: Cindy HazyDirk Cooper Hashem  Procedure(s) Performed: Procedure(s) (LRB): MINIMALLY INVASIVE AORTIC VALVE REPLACEMENT (AVR) (N/A) TRANSESOPHAGEAL ECHOCARDIOGRAM (TEE) (N/A)  Patient location during evaluation: Other Anesthesia Type: General Level of consciousness: sedated and patient remains intubated per anesthesia plan Pain management: pain level controlled Vital Signs Assessment: post-procedure vital signs reviewed and stable Respiratory status: patient on ventilator - see flowsheet for VS and patient remains intubated per anesthesia plan Cardiovascular status: unstable (pt with post op bleeding, emergently back to OR for exploration) Postop Assessment: no signs of nausea or vomiting Anesthetic complications: no Comments: Emergently back to OR for exploration for post op bleeding, 1400cc heme in chest tube       Last Vitals:  Vitals:   07/14/16 1900 07/14/16 1905  BP: 125/80 (!) 83/57  Pulse: 80 80  Resp: 16 11  Temp: (!) 35.9 C (!) 35.9 C    Last Pain:  Vitals:   07/14/16 1832  TempSrc: Core (Comment)  PainSc:                  Erling CruzJACKSON,E. Doyel Mulkern

## 2016-07-14 NOTE — Progress Notes (Signed)
  Echocardiogram Echocardiogram Transesophageal has been performed.  Jerry Odonnell, Jerry Odonnell 07/14/2016, 9:45 AM

## 2016-07-14 NOTE — Brief Op Note (Addendum)
07/09/2016 - 07/14/2016  2:15 PM  PATIENT:  Jerry Odonnell  61 y.o. male  PRE-OPERATIVE DIAGNOSIS:  SEVERE AS  POST-OPERATIVE DIAGNOSIS: post-operative echocardiogram pending  PROCEDURE:  Procedure(s): MINIMALLY INVASIVE AORTIC VALVE REPLACEMENT (AVR) (N/A) TRANSESOPHAGEAL ECHOCARDIOGRAM (TEE) (N/A)  SURGEON:    Purcell Nailslarence H Luvina Poirier, MD  ASSISTANTS:  Jari Favreessa Conte, PA-C  ANESTHESIA:   Jairo Benarswell Jackson, MD  CROSSCLAMP TIME:   127'  CARDIOPULMONARY BYPASS TIME: 207'  FINDINGS:  Bicuspid native aortic valve with severe aortic stenosis  Normal LV systolic function  Moderate LV hypertrophy  COMPLICATIONS: None  BASELINE WEIGHT: 86.2 kg  PATIENT DISPOSITION:   TO SICU IN STABLE CONDITION  Purcell Nailslarence H Knox Holdman, MD 07/14/2016 3:53 PM

## 2016-07-14 NOTE — Progress Notes (Addendum)
[facility] BRIEF SICU PROGRESS NOTE  Day of Surgery  S/P Procedure(s) (LRB): MINIMALLY INVASIVE AORTIC VALVE REPLACEMENT (AVR) (N/A) TRANSESOPHAGEAL ECHOCARDIOGRAM (TEE) (N/A)   Patient remains hemodynamically stable but has had > 600 mL chest tube output during the first 2 hours after return from OR.  Bloodwork c/w mild coagulapathy and thrombocytopenia.  Plan: Will transfuse platelets and FFP and return to OR for reexploration.  Discussed circumstances with the patient's daughter at the bedside.  All questions answered.  Purcell Nailslarence H Karl Erway, MD 07/14/2016 6:28 PM

## 2016-07-14 NOTE — Anesthesia Preprocedure Evaluation (Addendum)
Anesthesia Evaluation  Patient identified by MRN, date of birth, ID band Patient unresponsive    Reviewed: Allergy & Precautions, NPO status , Patient's Chart, lab work & pertinent test resultsPreop documentation limited or incomplete due to emergent nature of procedure.  History of Anesthesia Complications Negative for: history of anesthetic complications  Airway Mallampati: Intubated       Dental  (+) Chipped   Pulmonary neg pulmonary ROS,    breath sounds clear to auscultation       Cardiovascular (-) CAD (minimal ASCADz) + Valvular Problems/Murmurs (critical aortic stenosis, now s/p AVR) AS  Rhythm:Regular Rate:Normal - Systolic murmurs 1/61/092/19/18 ECHO: EF 60-65%, critical aortic stenosis with mild regurgitation. Valve area (VTI): 0.68 cm^2. Valve area (Vmax): 0.71 cm^2. Valve area (Vmean): 0.62 cm^2. Mean gradient (S): 88 mm Hg. Peak gradient (S): 133 mm Hg.   Neuro/Psych Syncope: critical aortic stenosis TIA   GI/Hepatic negative GI ROS, Neg liver ROS,   Endo/Other  negative endocrine ROS  Renal/GU negative Renal ROS     Musculoskeletal   Abdominal   Peds  Hematology Hb 15.1, plt 133k   Anesthesia Other Findings   Reproductive/Obstetrics                           Anesthesia Physical  Anesthesia Plan  ASA: IV and emergent  Anesthesia Plan: General   Post-op Pain Management:    Induction: Intravenous  Airway Management Planned: Oral ETT  Additional Equipment: Arterial line, PA Cath and 3D TEE  Intra-op Plan:   Post-operative Plan: Post-operative intubation/ventilation  Informed Consent: I have reviewed the patients History and Physical, chart, labs and discussed the procedure including the risks, benefits and alternatives for the proposed anesthesia with the patient or authorized representative who has indicated his/her understanding and acceptance.   Only emergency history  available  Plan Discussed with: CRNA and Surgeon  Anesthesia Plan Comments: (Plan routine monitors, existing A line and PA cath, GETA with TEE, post op ventilation)       Anesthesia Quick Evaluation

## 2016-07-14 NOTE — Anesthesia Preprocedure Evaluation (Addendum)
Anesthesia Evaluation  Patient identified by MRN, date of birth, ID band Patient awake    Reviewed: Allergy & Precautions, NPO status , Patient's Chart, lab work & pertinent test results  History of Anesthesia Complications Negative for: history of anesthetic complications  Airway Mallampati: II  TM Distance: >3 FB Neck ROM: Full    Dental  (+) Chipped, Dental Advisory Given   Pulmonary neg pulmonary ROS,    breath sounds clear to auscultation       Cardiovascular (-) CAD (minimal ASCADz) + Valvular Problems/Murmurs (critical aortic stenosis) AS  Rhythm:Regular Rate:Normal + Systolic murmurs 4/09/812/19/18 ECHO: EF 60-65%, critical aortic stenosis with mild regurgitation. Valve area (VTI): 0.68 cm^2. Valve area (Vmax): 0.71 cm^2. Valve area (Vmean): 0.62 cm^2. Mean gradient (S): 88 mm Hg. Peak gradient (S): 133 mm Hg.   Neuro/Psych Syncope: critical aortic stenosis TIA   GI/Hepatic negative GI ROS, Neg liver ROS,   Endo/Other  negative endocrine ROS  Renal/GU negative Renal ROS     Musculoskeletal   Abdominal   Peds  Hematology Hb 15.1, plt 133k   Anesthesia Other Findings   Reproductive/Obstetrics                            Anesthesia Physical Anesthesia Plan  ASA: III  Anesthesia Plan: General   Post-op Pain Management:    Induction: Intravenous  Airway Management Planned: Double Lumen EBT  Additional Equipment: Arterial line, PA Cath, 3D TEE and Ultrasound Guidance Line Placement  Intra-op Plan:   Post-operative Plan: Post-operative intubation/ventilation  Informed Consent: I have reviewed the patients History and Physical, chart, labs and discussed the procedure including the risks, benefits and alternatives for the proposed anesthesia with the patient or authorized representative who has indicated his/her understanding and acceptance.   Dental advisory given  Plan Discussed with:  CRNA and Surgeon  Anesthesia Plan Comments: (Plan routine monitors, A line, PA cath, GETA with DLT and TEE, post op ventilation)        Anesthesia Quick Evaluation

## 2016-07-15 ENCOUNTER — Inpatient Hospital Stay (HOSPITAL_COMMUNITY): Payer: BLUE CROSS/BLUE SHIELD

## 2016-07-15 ENCOUNTER — Encounter (HOSPITAL_COMMUNITY): Payer: Self-pay | Admitting: Thoracic Surgery (Cardiothoracic Vascular Surgery)

## 2016-07-15 DIAGNOSIS — Z953 Presence of xenogenic heart valve: Secondary | ICD-10-CM

## 2016-07-15 LAB — GLUCOSE, CAPILLARY
GLUCOSE-CAPILLARY: 112 mg/dL — AB (ref 65–99)
GLUCOSE-CAPILLARY: 137 mg/dL — AB (ref 65–99)
GLUCOSE-CAPILLARY: 143 mg/dL — AB (ref 65–99)
GLUCOSE-CAPILLARY: 74 mg/dL (ref 65–99)
Glucose-Capillary: 103 mg/dL — ABNORMAL HIGH (ref 65–99)
Glucose-Capillary: 106 mg/dL — ABNORMAL HIGH (ref 65–99)
Glucose-Capillary: 151 mg/dL — ABNORMAL HIGH (ref 65–99)
Glucose-Capillary: 165 mg/dL — ABNORMAL HIGH (ref 65–99)
Glucose-Capillary: 84 mg/dL (ref 65–99)

## 2016-07-15 LAB — CBC
HCT: 30.3 % — ABNORMAL LOW (ref 39.0–52.0)
HCT: 27.1 % — ABNORMAL LOW (ref 39.0–52.0)
Hemoglobin: 10.3 g/dL — ABNORMAL LOW (ref 13.0–17.0)
Hemoglobin: 9.4 g/dL — ABNORMAL LOW (ref 13.0–17.0)
MCH: 29.8 pg (ref 26.0–34.0)
MCH: 30.4 pg (ref 26.0–34.0)
MCHC: 34 g/dL (ref 30.0–36.0)
MCHC: 34.7 g/dL (ref 30.0–36.0)
MCV: 87.6 fL (ref 78.0–100.0)
MCV: 87.7 fL (ref 78.0–100.0)
PLATELETS: 94 10*3/uL — AB (ref 150–400)
PLATELETS: 83 10*3/uL — AB (ref 150–400)
RBC: 3.46 MIL/uL — ABNORMAL LOW (ref 4.22–5.81)
RBC: 3.09 MIL/uL — ABNORMAL LOW (ref 4.22–5.81)
RDW: 14.8 % (ref 11.5–15.5)
RDW: 15.4 % (ref 11.5–15.5)
WBC: 10.2 10*3/uL (ref 4.0–10.5)
WBC: 12.8 10*3/uL — AB (ref 4.0–10.5)

## 2016-07-15 LAB — PREPARE FRESH FROZEN PLASMA
UNIT DIVISION: 0
UNIT DIVISION: 0
UNIT DIVISION: 0
Unit division: 0

## 2016-07-15 LAB — BASIC METABOLIC PANEL
Anion gap: 9 (ref 5–15)
BUN: 10 mg/dL (ref 6–20)
CALCIUM: 7.8 mg/dL — AB (ref 8.9–10.3)
CO2: 21 mmol/L — ABNORMAL LOW (ref 22–32)
Chloride: 107 mmol/L (ref 101–111)
Creatinine, Ser: 0.75 mg/dL (ref 0.61–1.24)
GFR calc Af Amer: >60 mL/min (ref >60)
GLUCOSE: 105 mg/dL — AB (ref 65–99)
Potassium: 4.1 mmol/L (ref 3.5–5.1)
Sodium: 137 mmol/L (ref 135–145)

## 2016-07-15 LAB — POCT I-STAT 3, ART BLOOD GAS (G3+)
ACID-BASE DEFICIT: 7 mmol/L — AB (ref 0.0–2.0)
ACID-BASE DEFICIT: 6 mmol/L — AB (ref 0.0–2.0)
Acid-base deficit: 3 mmol/L — ABNORMAL HIGH (ref 0.0–2.0)
Bicarbonate: 23.5 mmol/L (ref 20.0–28.0)
Bicarbonate: 18 mmol/L — ABNORMAL LOW (ref 20.0–28.0)
Bicarbonate: 18.6 mmol/L — ABNORMAL LOW (ref 20.0–28.0)
O2 SAT: 99 %
O2 SAT: 97 %
O2 Saturation: 98 %
PCO2 ART: 47.6 mmHg (ref 32.0–48.0)
PH ART: 7.364 (ref 7.350–7.450)
PH ART: 7.363 (ref 7.350–7.450)
Patient temperature: 36.1
Patient temperature: 98.8
Patient temperature: 99.5
TCO2: 25 mmol/L (ref 0–100)
TCO2: 19 mmol/L (ref 0–100)
TCO2: 20 mmol/L (ref 0–100)
pCO2 arterial: 31.5 mmHg — ABNORMAL LOW (ref 32.0–48.0)
pCO2 arterial: 33 mmHg (ref 32.0–48.0)
pH, Arterial: 7.298 — ABNORMAL LOW (ref 7.350–7.450)
pO2, Arterial: 107 mmHg (ref 83.0–108.0)
pO2, Arterial: 141 mmHg — ABNORMAL HIGH (ref 83.0–108.0)
pO2, Arterial: 99 mmHg (ref 83.0–108.0)

## 2016-07-15 LAB — TYPE AND SCREEN
ABO/RH(D): B POS
ANTIBODY SCREEN: NEGATIVE
UNIT DIVISION: 0
Unit division: 0
Unit division: 0

## 2016-07-15 LAB — CREATININE, SERUM
CREATININE: 0.96 mg/dL (ref 0.61–1.24)
GFR calc non Af Amer: >60 mL/min (ref >60)

## 2016-07-15 LAB — POCT I-STAT 4, (NA,K, GLUC, HGB,HCT)
Glucose, Bld: 88 mg/dL (ref 65–99)
HCT: 35 % — ABNORMAL LOW (ref 39.0–52.0)
HEMOGLOBIN: 11.9 g/dL — AB (ref 13.0–17.0)
Potassium: 4.2 mmol/L (ref 3.5–5.1)
SODIUM: 138 mmol/L (ref 135–145)

## 2016-07-15 LAB — POCT I-STAT, CHEM 8
BUN: 14 mg/dL (ref 6–20)
BUN: 20 mg/dL (ref 6–20)
CHLORIDE: 102 mmol/L (ref 101–111)
CREATININE: 0.8 mg/dL (ref 0.61–1.24)
Calcium, Ion: 1.11 mmol/L — ABNORMAL LOW (ref 1.15–1.40)
Calcium, Ion: 1.12 mmol/L — ABNORMAL LOW (ref 1.15–1.40)
Chloride: 104 mmol/L (ref 101–111)
Creatinine, Ser: 0.6 mg/dL — ABNORMAL LOW (ref 0.61–1.24)
GLUCOSE: 96 mg/dL (ref 65–99)
Glucose, Bld: 185 mg/dL — ABNORMAL HIGH (ref 65–99)
HCT: 28 % — ABNORMAL LOW (ref 39.0–52.0)
HEMATOCRIT: 28 % — AB (ref 39.0–52.0)
HEMOGLOBIN: 9.5 g/dL — AB (ref 13.0–17.0)
Hemoglobin: 9.5 g/dL — ABNORMAL LOW (ref 13.0–17.0)
POTASSIUM: 4.5 mmol/L (ref 3.5–5.1)
Potassium: 3.8 mmol/L (ref 3.5–5.1)
Sodium: 140 mmol/L (ref 135–145)
Sodium: 137 mmol/L (ref 135–145)
TCO2: 27 mmol/L (ref 0–100)
TCO2: 20 mmol/L (ref 0–100)

## 2016-07-15 LAB — PREPARE PLATELET PHERESIS
UNIT DIVISION: 0
Unit division: 0

## 2016-07-15 LAB — MAGNESIUM
Magnesium: 2.2 mg/dL (ref 1.7–2.4)
Magnesium: 2 mg/dL (ref 1.7–2.4)

## 2016-07-15 MED ORDER — SODIUM CHLORIDE 0.9 % IV SOLN
0.0000 ug/min | INTRAVENOUS | Status: DC
  Administered 2016-07-15: 20 ug/min via INTRAVENOUS
  Filled 2016-07-15 (×2): qty 1

## 2016-07-15 MED ORDER — INSULIN ASPART 100 UNIT/ML ~~LOC~~ SOLN
0.0000 [IU] | SUBCUTANEOUS | Status: DC
  Administered 2016-07-15: 4 [IU] via SUBCUTANEOUS
  Administered 2016-07-15 – 2016-07-16 (×3): 2 [IU] via SUBCUTANEOUS

## 2016-07-15 MED ORDER — KETOROLAC TROMETHAMINE 15 MG/ML IJ SOLN
15.0000 mg | Freq: Four times a day (QID) | INTRAMUSCULAR | Status: AC
  Administered 2016-07-15 – 2016-07-16 (×5): 15 mg via INTRAVENOUS
  Filled 2016-07-15 (×5): qty 1

## 2016-07-15 MED FILL — Potassium Chloride Inj 2 mEq/ML: INTRAVENOUS | Qty: 40 | Status: AC

## 2016-07-15 MED FILL — Heparin Sodium (Porcine) Inj 1000 Unit/ML: INTRAMUSCULAR | Qty: 30 | Status: AC

## 2016-07-15 MED FILL — Magnesium Sulfate Inj 50%: INTRAMUSCULAR | Qty: 10 | Status: AC

## 2016-07-15 NOTE — Progress Notes (Signed)
[facility] BRIEF SICU PROGRESS NOTE  1 Day Post-Op  S/P Procedure(s) (LRB): MINIMALLY INVASIVE AORTIC VALVE REPLACEMENT (AVR) (N/A) TRANSESOPHAGEAL ECHOCARDIOGRAM (TEE) (N/A)  Doing well Minimal pain NSR w/ stable BP although still on very low dose Neo drip Breathing comfortably w/ O2 sats 100% on 2 L/min UOP adequate Labs okay  Plan: Continue routine care  Purcell Nailslarence H Evelynn Hench, MD 07/15/2016 5:56 PM

## 2016-07-15 NOTE — Progress Notes (Signed)
[address]       [place] [id number]             [phone]        CARDIOTHORACIC SURGERY PROGRESS NOTE   R1 Day Post-Op:  S/P Procedure(s) (LRB): MINIMALLY INVASIVE AORTIC VALVE REPLACEMENT (AVR) (N/A) TRANSESOPHAGEAL ECHOCARDIOGRAM (TEE) (N/A)   Subjective: Looks good.  Minimal chest pain.  Some discomfort right upper back.  No nausea.  No SOB  Objective: Vital signs: BP Readings from Last 1 Encounters:  07/15/16 (!) 88/50   Pulse Readings from Last 1 Encounters:  07/15/16 71   Resp Readings from Last 1 Encounters:  07/15/16 (!) 21   Temp Readings from Last 1 Encounters:  07/15/16 99.3 F (37.4 C)    Hemodynamics: PAP: (17-30)/(10-24) 29/15 CO:  [3.8 L/min-5 L/min] 5 L/min CI:  [1.8 L/min/m2-2.4 L/min/m2] 2.4 L/min/m2  Physical Exam:  Rhythm:   sinus  Breath sounds: clear  Heart sounds:  RRR  Incisions:  Dressings dry, intact  Abdomen:  Soft, non-distended, non-tender  Extremities:  Warm, well-perfused  Chest tubes:  low volume thin serosanguinous output, no air leak    Intake/Output from previous day: 02/21 0701 - 02/22 0700 In: 10562.8 [P.O.:30; I.V.:5266.8; Blood:3426; NG/GT:90; IV Piggyback:1750] Out: 4452 [Urine:1782; Emesis/NG output:200; Blood:725; Chest Tube:1745] Intake/Output this shift: No intake/output data recorded.  Lab Results:  CBC: Recent Labs  07/14/16 2200 07/14/16 2207 07/15/16 0500  WBC 12.9*  --  10.2  HGB 9.3* 7.8* 10.3*  HCT 27.0* 23.0* 30.3*  PLT 88*  --  94*    BMET:  Recent Labs  07/14/16 0430  07/14/16 2038 07/14/16 2207 07/15/16 0500  NA 135  < > 140 139 137  K 4.2  < > 4.6 4.9 4.1  CL 100*  < > 104  --  107  CO2 27  --   --   --  21*  GLUCOSE 111*  < > 102* 87 105*  BUN 13  < > 13  --  10  CREATININE 0.96  < > 0.60*  --  0.75  CALCIUM 9.4  --   --   --  7.8*  < > = values in this interval not displayed.   PT/INR:   Recent Labs  07/14/16 2200  LABPROT 21.1*  INR 1.79     CBG (last 3)   Recent Labs  07/15/16 0058 07/15/16 0433 07/15/16 0729  GLUCAP 74 106* 112*    ABG    Component Value Date/Time   PHART 7.363 07/15/2016 0608   PCO2ART 33.0 07/15/2016 0608   PO2ART 99.0 07/15/2016 0608   HCO3 18.6 (L) 07/15/2016 0608   TCO2 20 07/15/2016 0608   ACIDBASEDEF 6.0 (H) 07/15/2016 0608   O2SAT 97.0 07/15/2016 0608    CXR: PORTABLE CHEST 1 VIEW  COMPARISON:  July 14, 2016  FINDINGS: Endotracheal tube and nasogastric tube have been removed. Swan-Ganz catheter tip is in the right main pulmonary artery. Cordis tip is in the superior vena cava. Temporary pacemaker wires are attached to the right heart. Chest tube remains on the right, unchanged in position. No pneumothorax. There are patchy areas of mild atelectatic change in the right mid lower lung zones. Lungs elsewhere are clear. The heart remains mildly enlarged with pulmonary vascularity within normal limits. Patient is status post aortic valve replacement no adenopathy. No bone lesions.  IMPRESSION: Tube and catheter positions as described without pneumothorax. Patchy atelectasis on the right remains stable.  No new opacity. Stable cardiomegaly.   Electronically Signed   By: Bretta Bang III M.D.   On: 07/15/2016 07:27   EKG: NSR w/out acute ischemic changes, LBBB (new)    Assessment/Plan:  Doing well POD1 Maintaining NSR w/ stable hemodynamics, on very low dose Neo drip for BP support Breathing comfortably w/ O2 sats 99-100%  Expected post op acute blood loss anemia, mild, Hgb 10.3 this morning Expected post op atelectasis, mild, weight reportedly only 3 lbs > preop baseline Expected post op volume excess, mild, diuresing LBBB (new)   Mobilize  Wean Neo as tolerated  D/C lines  Hold diuretics until BP improved off Neo  Purcell Nails, MD 07/15/2016 8:35 AM

## 2016-07-15 NOTE — Progress Notes (Signed)
RT NOTE:  CPAP/PS initiated. Pt follows all commands.

## 2016-07-15 NOTE — Progress Notes (Signed)
RT NOTE:  Rapid wean started. Pt follows commands.

## 2016-07-15 NOTE — Procedures (Signed)
Extubation Procedure Note  Pt extubated following Cardiac Rapid Wean. Pt follows all commands.  NIF -28, VC 1.2L. No stridor Pt clearly speaks name/location Coughs/clears secretions Tolerating 2L [place]   Patient Details:   Name: Jerry Odonnell DOB: 24-Nov-1955 MRN: [id number]   Airway Documentation:     Evaluation  O2 sats: stable throughout Complications: No apparent complications Patient did tolerate procedure well. Bilateral Breath Sounds: Clear   Yes  Elmer PickerClifton, [name] 07/15/2016, 5:08 AM

## 2016-07-16 ENCOUNTER — Inpatient Hospital Stay (HOSPITAL_COMMUNITY): Payer: BLUE CROSS/BLUE SHIELD

## 2016-07-16 ENCOUNTER — Encounter (HOSPITAL_COMMUNITY)
Admission: EM | Disposition: A | Discharge status: Home / Self Care | Payer: Self-pay | Source: Home / Self Care | Attending: Interventional Cardiology

## 2016-07-16 ENCOUNTER — Inpatient Hospital Stay (HOSPITAL_COMMUNITY): Payer: BLUE CROSS/BLUE SHIELD | Admitting: Certified Registered Nurse Anesthetist

## 2016-07-16 ENCOUNTER — Encounter (HOSPITAL_COMMUNITY): Payer: Self-pay | Admitting: *Deleted

## 2016-07-16 HISTORY — PX: CHEST TUBE INSERTION: SHX231

## 2016-07-16 LAB — GLUCOSE, CAPILLARY
GLUCOSE-CAPILLARY: 100 mg/dL — AB (ref 65–99)
GLUCOSE-CAPILLARY: 114 mg/dL — AB (ref 65–99)
Glucose-Capillary: 120 mg/dL — ABNORMAL HIGH (ref 65–99)
Glucose-Capillary: 134 mg/dL — ABNORMAL HIGH (ref 65–99)
Glucose-Capillary: 144 mg/dL — ABNORMAL HIGH (ref 65–99)

## 2016-07-16 LAB — CBC
HCT: 26 % — ABNORMAL LOW (ref 39.0–52.0)
Hemoglobin: 9.1 g/dL — ABNORMAL LOW (ref 13.0–17.0)
MCH: 30.7 pg (ref 26.0–34.0)
MCHC: 35 g/dL (ref 30.0–36.0)
MCV: 87.8 fL (ref 78.0–100.0)
PLATELETS: 68 10*3/uL — AB (ref 150–400)
RBC: 2.96 MIL/uL — AB (ref 4.22–5.81)
RDW: 15.4 % (ref 11.5–15.5)
WBC: 9.2 10*3/uL (ref 4.0–10.5)

## 2016-07-16 LAB — BASIC METABOLIC PANEL
Anion gap: 4 — ABNORMAL LOW (ref 5–15)
BUN: 24 mg/dL — AB (ref 6–20)
CHLORIDE: 103 mmol/L (ref 101–111)
CO2: 24 mmol/L (ref 22–32)
CREATININE: 1.06 mg/dL (ref 0.61–1.24)
Calcium: 8.1 mg/dL — ABNORMAL LOW (ref 8.9–10.3)
GFR calc Af Amer: >60 mL/min (ref >60)
GFR calc non Af Amer: >60 mL/min (ref >60)
Glucose, Bld: 102 mg/dL — ABNORMAL HIGH (ref 65–99)
Potassium: 3.5 mmol/L (ref 3.5–5.1)
SODIUM: 131 mmol/L — AB (ref 135–145)

## 2016-07-16 SURGERY — CHEST TUBE INSERTION
Anesthesia: Monitor Anesthesia Care | Site: Chest

## 2016-07-16 MED ORDER — ASPIRIN EC 325 MG PO TBEC: 325.0000 mg | DELAYED_RELEASE_TABLET | Freq: Every day | ORAL | Status: DC

## 2016-07-16 MED ORDER — LACTATED RINGERS IV SOLN
INTRAVENOUS | Status: DC
  Administered 2016-07-16: 12:00:00 via INTRAVENOUS

## 2016-07-16 MED ORDER — MIDAZOLAM HCL 2 MG/2ML IJ SOLN
INTRAMUSCULAR | Status: AC
  Filled 2016-07-16: qty 2

## 2016-07-16 MED ORDER — SODIUM CHLORIDE 0.9% FLUSH
3.0000 mL | INTRAVENOUS | Status: DC | PRN
  Administered 2016-07-16: 10:00:00 via INTRAVENOUS
  Filled 2016-07-16: qty 3

## 2016-07-16 MED ORDER — BUPIVACAINE HCL (PF) 0.5 % IJ SOLN
INTRAMUSCULAR | Status: DC | PRN
  Administered 2016-07-16: 10 mL

## 2016-07-16 MED ORDER — MOVING RIGHT ALONG BOOK
Freq: Once | Status: DC
  Filled 2016-07-16: qty 1

## 2016-07-16 MED ORDER — FUROSEMIDE 40 MG PO TABS
40.0000 mg | ORAL_TABLET | Freq: Every day | ORAL | Status: AC
  Administered 2016-07-16 – 2016-07-18 (×3): 40 mg via ORAL
  Filled 2016-07-16 (×4): qty 1

## 2016-07-16 MED ORDER — MIDAZOLAM HCL 5 MG/5ML IJ SOLN
INTRAMUSCULAR | Status: DC | PRN
  Administered 2016-07-16 (×3): 1 mg via INTRAVENOUS

## 2016-07-16 MED ORDER — SODIUM CHLORIDE 0.9% FLUSH
3.0000 mL | Freq: Two times a day (BID) | INTRAVENOUS | Status: DC
  Administered 2016-07-16 – 2016-07-17 (×2): 3 mL via INTRAVENOUS

## 2016-07-16 MED ORDER — POTASSIUM CHLORIDE CRYS ER 20 MEQ PO TBCR
20.0000 meq | EXTENDED_RELEASE_TABLET | Freq: Every day | ORAL | Status: AC
  Administered 2016-07-16 – 2016-07-18 (×3): 20 meq via ORAL
  Filled 2016-07-16 (×3): qty 1

## 2016-07-16 MED ORDER — BUPIVACAINE HCL 0.5 % IJ SOLN
INTRAMUSCULAR | Status: AC
  Filled 2016-07-16: qty 1

## 2016-07-16 MED ORDER — SODIUM CHLORIDE 0.9 % IV SOLN
30.0000 meq | Freq: Once | INTRAVENOUS | Status: AC
  Administered 2016-07-16: 30 meq via INTRAVENOUS
  Filled 2016-07-16: qty 15

## 2016-07-16 MED ORDER — ATORVASTATIN CALCIUM 10 MG PO TABS
10.0000 mg | ORAL_TABLET | Freq: Every day | ORAL | Status: DC
  Administered 2016-07-16 – 2016-07-18 (×3): 10 mg via ORAL
  Filled 2016-07-16 (×4): qty 1

## 2016-07-16 MED ORDER — ASPIRIN EC 81 MG PO TBEC
81.0000 mg | DELAYED_RELEASE_TABLET | Freq: Every day | ORAL | Status: DC
  Administered 2016-07-16 – 2016-07-19 (×4): 81 mg via ORAL
  Filled 2016-07-16 (×5): qty 1

## 2016-07-16 MED ORDER — PROPOFOL 10 MG/ML IV BOLUS
INTRAVENOUS | Status: AC
  Filled 2016-07-16: qty 20

## 2016-07-16 MED ORDER — SODIUM CHLORIDE 0.9 % IV SOLN: 250.0000 mL | INTRAVENOUS | Status: DC | PRN

## 2016-07-16 MED FILL — Sodium Chloride IV Soln 0.9%: INTRAVENOUS | Qty: 2000 | Status: AC

## 2016-07-16 MED FILL — Electrolyte-R (PH 7.4) Solution: INTRAVENOUS | Qty: 4000 | Status: AC

## 2016-07-16 MED FILL — Mannitol IV Soln 20%: INTRAVENOUS | Qty: 500 | Status: AC

## 2016-07-16 MED FILL — Heparin Sodium (Porcine) Inj 1000 Unit/ML: INTRAMUSCULAR | Qty: 30 | Status: AC

## 2016-07-16 MED FILL — Lidocaine HCl IV Inj 20 MG/ML: INTRAVENOUS | Qty: 5 | Status: AC

## 2016-07-16 MED FILL — Sodium Bicarbonate IV Soln 8.4%: INTRAVENOUS | Qty: 50 | Status: AC

## 2016-07-16 MED FILL — Sodium Chloride IV Soln 0.9%: INTRAVENOUS | Qty: 3000 | Status: AC

## 2016-07-16 SURGICAL SUPPLY — 18 items
COVER SURGICAL LIGHT HANDLE (MISCELLANEOUS) ×2 IMPLANT
DERMABOND ADHESIVE PROPEN (GAUZE/BANDAGES/DRESSINGS) ×1
DERMABOND ADVANCED .7 DNX6 (GAUZE/BANDAGES/DRESSINGS) ×1 IMPLANT
DRAPE CHEST BREAST 15X10 FENES (DRAPES) ×2 IMPLANT
DRSG COVADERM 4X6 (GAUZE/BANDAGES/DRESSINGS) ×2 IMPLANT
ELECT REM PT RETURN 9FT ADLT (ELECTROSURGICAL) ×2
ELECTRODE REM PT RTRN 9FT ADLT (ELECTROSURGICAL) ×1 IMPLANT
GLOVE BIOGEL PI IND STRL 6.5 (GLOVE) ×1 IMPLANT
GLOVE BIOGEL PI INDICATOR 6.5 (GLOVE) ×1
GLOVE ORTHO TXT STRL SZ7.5 (GLOVE) ×2 IMPLANT
GOWN STRL REUS W/ TWL LRG LVL3 (GOWN DISPOSABLE) ×2 IMPLANT
GOWN STRL REUS W/TWL LRG LVL3 (GOWN DISPOSABLE) ×2
KIT BASIN OR (CUSTOM PROCEDURE TRAY) ×2 IMPLANT
NEEDLE HYPO 25GX1X1/2 BEV (NEEDLE) ×2 IMPLANT
PACK GENERAL/GYN (CUSTOM PROCEDURE TRAY) ×2 IMPLANT
SUT SILK  1 MH (SUTURE) ×1
SUT SILK 1 MH (SUTURE) ×1 IMPLANT
SYR CONTROL 10ML LL (SYRINGE) ×2 IMPLANT

## 2016-07-16 NOTE — Progress Notes (Signed)
[address]       [place] [id number]             [phone]        CARDIOTHORACIC SURGERY PROGRESS NOTE   R2 Days Post-Op  S/P Procedure(s) (LRB): MINIMALLY INVASIVE AORTIC VALVE REPLACEMENT (AVR) (N/A) TRANSESOPHAGEAL ECHOCARDIOGRAM (TEE) (N/A)  Subjective: Doing well.  No complaints but lots of questions.  Mild soreness in chest.  Objective: Vital signs: BP Readings from Last 1 Encounters:  07/16/16 (!) 107/59   Pulse Readings from Last 1 Encounters:  07/16/16 76   Resp Readings from Last 1 Encounters:  07/16/16 10   Temp Readings from Last 1 Encounters:  07/16/16 98.4 F (36.9 C) (Oral)    Hemodynamics: PAP: (23-30)/(18-27) 30/27  Physical Exam:  Rhythm:   sinus  Breath sounds: clear  Heart sounds:  RRR  Incisions:  Dressings dry, intact  Abdomen:  Soft, non-distended, non-tender  Extremities:  Warm, well-perfused  Chest tubes:  low volume thin serosanguinous output, no air leak    Intake/Output from previous day: 02/22 0701 - 02/23 0700 In: 1357.6 [P.O.:240; I.V.:752.6; IV Piggyback:365] Out: 1455 [Urine:715; Chest Tube:740] Intake/Output this shift: No intake/output data recorded.  Lab Results:  CBC: Recent Labs  07/15/16 1613 07/15/16 1619 07/16/16 0426  WBC 12.8*  --  9.2  HGB 9.4* 9.5* 9.1*  HCT 27.1* 28.0* 26.0*  PLT 83*  --  68*    BMET:  Recent Labs  07/15/16 0500  07/15/16 1619 07/16/16 0426  NA 137  --  137 131*  K 4.1  --  3.8 3.5  CL 107  --  104 103  CO2 21*  --   --  24  GLUCOSE 105*  --  185* 102*  BUN 10  --  20 24*  CREATININE 0.75  < > 0.80 1.06  CALCIUM 7.8*  --   --  8.1*  < > = values in this interval not displayed.   PT/INR:   Recent Labs  07/14/16 2200  LABPROT 21.1*  INR 1.79    CBG (last 3)   Recent Labs  07/15/16 1937 07/15/16 2259 07/16/16 0259  GLUCAP 165* 151* 100*    ABG    Component Value Date/Time   PHART 7.363 07/15/2016 0608   PCO2ART 33.0 07/15/2016  0608   PO2ART 99.0 07/15/2016 0608   HCO3 18.6 (L) 07/15/2016 0608   TCO2 20 07/15/2016 1619   ACIDBASEDEF 6.0 (H) 07/15/2016 0608   O2SAT 97.0 07/15/2016 0608    CXR: PORTABLE CHEST 1 VIEW  COMPARISON:  07/15/2016 .  FINDINGS: Interim removal Swan-Ganz catheter. Right IJ sheath, right chest tubes in stable position. Stable cardiomegaly. Prior cardiac valve replacement. Persistent basilar atelectasis. Small left pleural effusion. No pneumothorax .  IMPRESSION: 1. Interim removal Swan-Ganz catheter. Right IJ sheath, right chest tubes in stable position. No pneumothorax.  2. Stable cardiomegaly. Stable basilar atelectasis. Small left pleural effusion.   Electronically Signed   By: Maisie Fushomas  Register   On: 07/16/2016 07:13  Assessment/Plan: S/P Procedure(s) (LRB): TRANSESOPHAGEAL ECHOCARDIOGRAM (TEE) (N/A) REEXPLORATION OF RIGHT THORACOTOMY FOR BLEEDING (Right)  Doing well POD2 Maintaining NSR w/ stable BP off Neo drip Breathing comfortably w/ O2 sats 99-100%  Expected post op acute blood loss anemia, mild, Hgb 9.1 this morning Expected post op atelectasis, mild, weight reportedly only 3 lbs > preop baseline Expected post op volume excess, mild, diuresing Thrombocytopenia - chronic with acute exacerbation - platelet count down  slightly to 68k LBBB (new)   Mobilize  D/C tubes and foley  Diuresis  Watch platelet count and decrease ASA to 81 mg/day  Transfer step down  Purcell Nails, MD 07/16/2016 8:09 AM

## 2016-07-16 NOTE — Op Note (Signed)
CARDIOTHORACIC SURGERY OPERATIVE NOTE  Date of Procedure:   07/16/2016  Preoperative Diagnosis:  Chest tube in place  Postoperative Diagnosis:  same  Procedure:    Chest tube removal  Surgeon:    Salvatore Decentlarence H. Cornelius Moraswen, MD  Assistant:    Frutoso Schatziana Acuna, CRNFA  Anesthesia:    0.5% lidocaine local with intravenous sedation    BRIEF CLINICAL NOTE AND INDICATIONS FOR SURGERY  Patient is a 61 year old male who underwent minimally invasive aortic valve replacement through a right anterior mini thoracotomy incision on 07/14/2016. The patient's immediate postoperative recovery was notable for bleeding requiring return to the operating room for reexploration. The patient has otherwise recovered uneventfully and attempts were made to remove the patient's existing chest tubes by the nursing staff in the intensive care unit. One of the tubes was removed uneventfully but the other appeared to be stuck and elicited pain with attempts to pull it out. Further attempts by Jari Favreessa Conte at the bedside were also associated with pain, suggesting that the lateral chest tube was perhaps clot in the chest tube closure. Rather than proceeding with any further measures at the bedside plans were made to bring the patient directly to the operating room for chest tube removal where sterile technique could be optimized and intravenous sedation could be administered minimal risk. The patient provides full informed consent for the procedure as described.     DETAILS OF THE OPERATIVE PROCEDURE  The patient is brought to the operating room on the above mentioned date and placed in the supine position on the operative table. 2 mg of midazolam are administered intravenously by the anesthesia team under continuous monitored anesthesia care.  The patient's anterior chest are prepared and draped in a sterile manner. 0.5% lidocaine solution is utilized to anesthetize the skin is changed tissues around the existing chest tube. The existing  temporary epicardial pacing wires are removed. The remaining chest tube is examined and appears to be stuck because of a suture in the subcutaneous tissues alongside the chest tube.  A Metzenbaum scissors was passed along the side of the tube to cut the suture that was holding the tube in place. The tube was removed uneventfully. A skin stitch was placed in the chest tube incision to reapproximate the skin. A dry sterile dressing was applied. The patient tolerated the procedure well and is transported directly to the postanesthesia care unit in stable condition.    Salvatore Decentlarence H. Cornelius Moraswen MD 07/16/2016 12:34 PM

## 2016-07-16 NOTE — Anesthesia Procedure Notes (Signed)
Procedure Name: MAC Date/Time: 07/16/2016 12:15 PM Performed by: Salli Quarry Ayah Cozzolino Pre-anesthesia Checklist: Patient identified, Emergency Drugs available, Suction available and Patient being monitored Patient Re-evaluated:Patient Re-evaluated prior to inductionOxygen Delivery Method: Nasal cannula

## 2016-07-16 NOTE — Care Management Note (Addendum)
Case Management Note Initial Note Created by Deborah Taylor 07/09/16  Patient Details  Name: Jerry Odonnell MRN: [id number] Date of Birth: 1955-10-31  Subjective/Objective:  S/p heart cath, has severe aortic stenosis, ct consulted for aortic stenosis surgery, patient lives alone, he has a pcp, he will have [facility] [employer] by his friend Fannie KneeSue , he has medication coverage.  NCM will cont to follow for dc needs.                Action/Plan:   Expected Discharge Date:                  Expected Discharge Plan:  Home w Home Health Services  In-House Referral:     Discharge planning Services     Post Acute Care Choice:    Choice offered to:     DME Arranged:    DME Agency:     HH Arranged:    HH Agency:     Status of Service:  In process, will continue to follow  If discussed at Long Length of Stay Meetings, dates discussed:    Additional Comments: Daughter at bedside - states she will provide 24 hour supervision at discharge until son arrives - pt in agreement  Pt is alert and orient - now s/p MVR.  Pt verified that PTA he was completely independent from home alone.  Pts son will come to stay with him on Tuesday 2/27- pt is interested in private duty nursing if 24 hour care is recommended at discharge until son arrives.  Bedside nurse to request clarification regarding recommended supervision at discharge - CM also left attending sticky note requesting clarification.  CM will provide private duty nursing list.  Cherylann ParrClaxton, Layci Stenglein S, RN 07/16/2016, 9:50 AM

## 2016-07-16 NOTE — Progress Notes (Signed)
Patient ID: Jerry Odonnell, male   DOB: [birth year], 61 y.o.   MRN: [id number] EVENING ROUNDS NOTE :     [address]       [place] [id number]             [phone]                 Day of Surgery Procedure(s) (LRB): CHEST TUBE REMOVAL, right chest (N/A)  Total Length of Stay:  LOS: 6 days  BP 112/61   Pulse 69   Temp 98.7 F (37.1 C) (Oral)   Resp (!) 4   Ht 5\' 11"  (1.803 m)   Wt 208 lb 12.4 oz (94.7 kg)   SpO2 100%   BMI 29.12 kg/m   .Intake/Output      02/22 0701 - 02/23 0700 02/23 0701 - 02/24 0700   P.O. 240 120   I.V. (mL/kg) 752.6 (7.9) 200 (2.1)   Blood     NG/GT     IV Piggyback 365    Total Intake(mL/kg) 1357.6 (14.3) 320 (3.4)   Urine (mL/kg/hr) 715 (0.3) 125 (0.1)   Emesis/NG output     Blood  0 (0)   Chest Tube 740 (0.3) 140 (0.1)   Total Output 1455 265   Net -97.5 +55          . sodium chloride    . lactated ringers 10 mL/hr at 07/16/16 1201     Lab Results  Component Value Date   WBC 9.2 07/16/2016   HGB 9.1 (L) 07/16/2016   HCT 26.0 (L) 07/16/2016   PLT 68 (L) 07/16/2016   GLUCOSE 102 (H) 07/16/2016   ALT 42 07/09/2016   AST 28 07/09/2016   NA 131 (L) 07/16/2016   K 3.5 07/16/2016   CL 103 07/16/2016   CREATININE 1.06 07/16/2016   BUN 24 (H) 07/16/2016   CO2 24 07/16/2016   TSH 0.415 07/09/2016   INR 1.79 07/14/2016   HGBA1C 5.4 07/09/2016   Stable waiting for step down bed  Delight OvensEdward B Deklin Bieler MD  Beeper [phone] Office [phone] 07/16/2016 5:54 PM

## 2016-07-16 NOTE — Plan of Care (Signed)
Problem: Activity: Goal: Risk for activity intolerance will decrease Outcome: Progressing Pt ambulates100 ft  Problem: Cardiac: Goal: Hemodynamic stability will improve Outcome: Completed/Met Date Met: 07/16/16 All gtts weaned off Goal: Will show no signs and symptoms of excessive bleeding Outcome: Progressing Chest tube with minimal output  Problem: Respiratory: Goal: Levels of oxygenation will improve Outcome: Completed/Met Date Met: 07/16/16 Pt on RA Goal: Respiratory status will improve Outcome: Completed/Met Date Met: 07/16/16 Pt on RA Goal: Ability to tolerate decreased levels of ventilator support will improve Outcome: Completed/Met Date Met: 07/16/16 Pt extubated 2/22  Problem: Pain Management: Goal: Pain level will decrease Outcome: Progressing Pain managed with Toradol and Tylenol and Oxycodone  Problem: Tissue Perfusion: Goal: Risk of venous thrombosis will decrease Outcome: Progressing Pt starting ambulation . SCD's while in bed.  Problem: Urinary Elimination: Goal: Ability to achieve and maintain adequate renal perfusion and functioning will improve Outcome: Progressing Pt with adequate urine output

## 2016-07-16 NOTE — Progress Notes (Signed)
Patient ID: Jerry Odonnell, male   DOB: 1956-03-11, 61 y.o.   MRN: [id number] EVENING ROUNDS NOTE :     [address]       Jerry Odonnell,Jerry Odonnell             [phone]                 Day of Surgery Procedure(s) (LRB): CHEST TUBE REMOVAL, right chest (N/A)  Total Length of Stay:  LOS: 6 days  BP 112/61   Pulse 69   Temp 98.7 F (37.1 C) (Oral)   Resp (!) 4   Ht 5\' 11"  (1.803 m)   Wt 208 lb 12.4 oz (94.7 kg)   SpO2 100%   BMI 29.12 kg/m   .Intake/Output      02/22 0701 - 02/23 0700 02/23 0701 - 02/24 0700   P.O. 240 120   I.V. (mL/kg) 752.6 (7.9) 200 (2.1)   Blood     NG/GT     IV Piggyback 365    Total Intake(mL/kg) 1357.6 (14.3) 320 (3.4)   Urine (mL/kg/hr) 715 (0.3) 125 (0.1)   Emesis/NG output     Blood  0 (0)   Chest Tube 740 (0.3) 140 (0.1)   Total Output 1455 265   Net -97.5 +55          . sodium chloride    . lactated ringers 10 mL/hr at 07/16/16 1201     Lab Results  Component Value Date   WBC 9.2 07/16/2016   HGB 9.1 (L) 07/16/2016   HCT 26.0 (L) 07/16/2016   PLT 68 (L) 07/16/2016   GLUCOSE 102 (H) 07/16/2016   ALT 42 07/09/2016   AST 28 07/09/2016   NA 131 (L) 07/16/2016   K 3.5 07/16/2016   CL 103 07/16/2016   CREATININE 1.06 07/16/2016   BUN 24 (H) 07/16/2016   CO2 24 07/16/2016   TSH 0.415 07/09/2016   INR 1.79 07/14/2016   HGBA1C 5.4 07/09/2016   Chest tube out,  feels well  Jerry OvensEdward B Oz Gammel MD  Beeper [phone] Office [phone] 07/16/2016 6:01 PM

## 2016-07-16 NOTE — Discharge Summary (Signed)
Physician Discharge Summary       [address]       [place] [address]             [phone]    Patient ID: Jerry Odonnell MRN: [id number] DOB/AGE: 63-Dec-1957 63 y.o.  Admit date: 07/09/2016 Discharge date: 07/19/2016  Admission Diagnoses: Severe aortic stenosis  Active Diagnoses:  1. Hyperlipidemia 2. Thrombocytopenia (HCC) 3. Syncope and collapse 4. Personal history of TIA (transient ischemic attack)-Admission in 2017 to [facility] with reported small lesion seen on imaging 5. ABL anemia   Procedure (s):  ? Minimally Invasive Aortic Valve Replacement                   Right Anterior Mini Thoracotomy Approach             Edwards Intuity Elite Rapid Deployment Pericardial Tissue Valve (size 23mm, model #8300AB, serial [id number]) by Dr. Cornelius Moras on 07/14/2016.  Reexploration of right mini thoracotomy for bleeding by Dr. Cornelius Moras on 07/14/2016.  Chest tube removal by Dr. Cornelius Moras on 07/16/2016  History of Presenting Illness: Patient is a 61 year old male with recently discovered critical aortic stenosis who was admitted to the hospital following a syncopal event and has been referred for second surgical opinion to discuss treatment options for management of aortic stenosis. The patient states that he has known he had a heart murmur for many years. Approximately 14 years ago he was referred for cardiology evaluation because of an abnormal EKG and underwent a stress test. An echocardiogram was apparently not performed at that time. The patient was felt to be low risk and no further etiology follow-up was arranged. Last fall the patient returned to see his primary care physician for routine examination. At that time the patient was told that the character of his heart murmur had changed and a routine echocardiogram was ordered. The patient underwent transthoracic echocardiogram at [facility] in [facility] on 04/28/2016. By report this echocardiogram  demonstrated the presence of critical aortic stenosis with peak velocity across the aortic valve measured 5.4 m/s corresponding to mean transvalvular gradient estimated 86 mmHg. Left ventricular systolic function remained normal with ejection fraction estimated 60%. The patient discuss these findings with Dr. Beverely Pace in [facility] and ultimately decided to seek a second opinion. He was evaluated last week by Dr. Allyson Sabal and plans were made for elective diagnostic cardiac catheterization later this week.  However, on February 16 the patient was walking with a friend and suffered a syncopal episode. He states that they were walking at a ordinary patient up a hill and he began to feel dizzy. They decided to stop and turned around but he began to feel worse. He ultimately sat down on the grass and lost consciousness for over a minute. EMS was summoned and he was admitted directly to the hospital. Diagnostic cardiac catheterization was performed demonstrating minimal nonobstructive coronary artery disease and normal right-sided pressures. No attempt was made to cross the aortic valve. Cardiothoracic surgical consultation was requested and the patient was evaluated by Dr. Laneta Simmers. Surgical options were discussed and a second opinion requested to discuss the possibility of minimally invasive approach for surgery.  The patient is a widower and lives alone in [place]. He works as a Production designer, theatre/television/film for [employer].  He has 2 adult children and he remains physically quite active. He walks every day and reports no symptoms of exertional shortness of breath or chest tightness. He admits that both his children and  his close friend has noticed that he seems to walks more slowly than he had in the past and that he gets tired more easily than he used to. He has had a few other dizzy spells in the past including an episode of prolonged vertigo last August for which he was evaluated in an emergency room in [facility]. Initially there  was concern that he may have had a stroke but CT scan of the brain did not reveal any signs of a stroke. He was noted to have a heart murmur at that time but no echocardiogram was performed. He was told that his vertigo was probably related to an inner ear problem.  The patient denies any symptoms of significant shortness of breath, orthopnea, or lower extremity edema.  Dr. Cornelius Moras personally reviewed the patient's ECHO and CT angiograms performed earlier today and discussed the findings at length with him.  He appears to be a good candidate for minimally invasive approach for aortic valve replacement.  The relative risks and benefits of each have been reviewed as they pertain to the patient's specific circumstances, and all of their questions have been addressed.  He understands and accepts all potential risks of surgery. He was admitted to [facility] on 07/14/2016 in order to undergo a minimally invasive aortic valve replacement.  Brief Hospital Course:  He had to be taken back to surgery the evening of 02/21 for re exploration of the right mini thoracotomy for bleeding.The patient was extubated the morning of post operative day one. He remained afebrile and hemodynamically stable. He was weaned off of Neo Synephrine drip. Theone Murdoch, a line, and foley were removed early in the post operative course. Lopressor was started and titrated accordingly. He was volume over loaded and diuresed. He was taken to the OR on 02/23 for removal of a chest tube as it was stuck secondary to a suture in the subcutaneous tissue. The epicardial pacing wires were also removed at this time. He had ABL anemia. He did not require a post op transfusion. Last H and H was 8.4/24.8. He was weaned off the insulin drip. he patient's glucose remained well controlled.The patient's HGA1C pre op was 5.4. The patient was felt surgically stable for transfer from the ICU to [facility]U for further convalescence on 07/17/2016. He continues to  progress with cardiac rehab. He was ambulating on room air. He has been tolerating a diet and has had a bowel movement.  The patient is felt surgically stable for discharge today.    Latest Vital Signs: Blood pressure 114/64, pulse 67, temperature 97.5 F (36.4 C), temperature source Oral, resp. rate 18, height 5\' 11"  (1.803 m), weight 90.3 kg (199 lb), SpO2 99 %.  Physical Exam: Rhythm:                       sinus             Breath sounds:            clear             Heart sounds:              RRR             Incisions:                     Dressings dry, intact             Abdomen:  Soft, non-distended, non-tender             Extremities:                 Warm, well-perfused             Chest tubes:                low volume thin serosanguinous output, no air leak  Discharge Condition:Stable and discharged to home.  Recent laboratory studies:  Lab Results  Component Value Date   WBC 7.2 07/19/2016   HGB 8.4 (L) 07/19/2016   HCT 24.8 (L) 07/19/2016   MCV 90.5 07/19/2016   PLT 112 (L) 07/19/2016   Lab Results  Component Value Date   NA 133 (L) 07/17/2016   K 4.2 07/17/2016   CL 103 07/17/2016   CO2 23 07/17/2016   CREATININE 1.03 07/17/2016   GLUCOSE 118 (H) 07/17/2016    Diagnostic Studies:  Ct Cardiac Morph/pulm Vein W/cm&w/o Ca Score  Addendum Date: 07/12/2016   ADDENDUM REPORT: 07/12/2016 19:18 CLINICAL DATA:  61 year old male with severe aortic stenosis. EXAM: Cardiac TAVR CT TECHNIQUE: The patient was scanned on a Philips 256 scanner. A 120 kV retrospective scan was triggered in the descending thoracic aorta at 111 HU's. Gantry rotation speed was 270 msecs and collimation was .9 mm. No beta blockade or nitro were given. The 3D data set was reconstructed in 5% intervals of the R-R cycle. Systolic and diastolic phases were analyzed on a dedicated work station using MPR, MIP and VRT modes. The patient received 80 cc of contrast. FINDINGS: Aortic Valve:  Functionally bicuspid, severely calcified and thickened with severely restricted leaflet opening. Aorta:  Normal size, no calcifications, no dissection. 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 It originated high anteriorly between right and left coronary cusps and runs partially between aorta and pulmonary artery. There is no obvious obstruction of flow in systole. RCA is a dominant artery that gives rise to PDA and PLA arteries. RCA has no obvious plaque, PDA is poorly visualized but has no obvious plaque. Left main is a large artery with no plaque. It gives rise to LAD, two rami intermedia and LCX artery. LAD is a large artery that has moderate excentric calcified plaque in the ostial and proximal portion with associated stenosis 25-50%. There is another calcified plaque in the mid portion with stenosis 25-50% and another in the distal portion. The first ramus intermedium has calcified plaque in its ostium with associated 25-50% stenosis, otherwise no plaque. Second ramus intermedius is a small artery that has no plaque. LCX artery is a small non-dominant artery with no plaque. Normal size of the pulmonary artery. Normal pulmonary vein drainage into the left atrium. No thrombus in the left atrial appendage. No ASD/VSD. Severe concentric left ventricular hypertrophy. IMPRESSION: 1. Functionally bicuspid, severely calcified and thickened with severely restricted leaflet opening. 2.  Aorta has normal size, no calcifications, no dissection. 3. 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 It originated high anteriorly between right and left coronary cusps and runs partially between aorta  and pulmonary artery. There is no obvious obstruction  of flow in systole. 4. There is moderate eccentric calcification of the ostial LAD with only mild CAD. 5. Severe concentric left ventricular hypertrophy. Tobias Alexander Electronically Signed   By: Tobias Alexander   On: 07/12/2016 19:18   Result Date: 07/12/2016 EXAM: OVER-READ INTERPRETATION  CT CHEST The following report is an over-read performed by radiologist Dr. Royal Piedra [facility] on 07/12/2016. This over-read does not include interpretation of cardiac or coronary anatomy or pathology. The coronary calcium score/coronary CTA interpretation by the cardiologist is attached. COMPARISON:  Chest CT 06/26/2002. FINDINGS: Extracardiac findings will be described on separate dictation for contemporaneously obtained CTA of the chest, abdomen and pelvis 07/12/2016. IMPRESSION: Please see separate dictation for contemporaneously obtained CTA of the chest, abdomen and pelvis dated 07/12/2016 for full description of extracardiac findings. Electronically Signed: By: Trudie Reed M.D. On: 07/12/2016 16:15   Dg Chest Port 1 View  Result Date: 07/16/2016 CLINICAL DATA:  Atelectasis. EXAM: PORTABLE CHEST 1 VIEW COMPARISON:  07/15/2016 . FINDINGS: Interim removal Swan-Ganz catheter. Right IJ sheath, right chest tubes in stable position. Stable cardiomegaly. Prior cardiac valve replacement. Persistent basilar atelectasis. Small left pleural effusion. No pneumothorax . IMPRESSION: 1. Interim removal Swan-Ganz catheter. Right IJ sheath, right chest tubes in stable position. No pneumothorax. 2. Stable cardiomegaly. Stable basilar atelectasis. Small left pleural effusion. Electronically Signed   By: Maisie Fus  Register   On: 07/16/2016 07:13   Dg Chest Port 1 View  Result Date: 07/14/2016 CLINICAL DATA:  61 y/o  M; atelectasis. EXAM: PORTABLE CHEST 1 VIEW COMPARISON:  07/12/2016 CT chest. FINDINGS: Right-sided chest tubes, right central venous  catheter tip projecting over lower right brachiocephalic vein, left Swan-Ganz catheter tip projecting over the heart, aortic valve replacement, and surgical plate projecting over mid sternum. Enteric tube tip below the field of view in the abdomen. Endotracheal tube tip 7.7 cm from the carina. No appreciable pneumothorax. No consolidation of the lungs or pleural effusion. No acute osseous abnormality. IMPRESSION: Endotracheal tubes tip 7.7 cm from the carina. Enteric tube tip below the field of view and abdomen. Electronically Signed   By: Mitzi Hansen M.D.   On: 07/14/2016 18:39   Ct Angio Chest/abd/pel For Dissection W And/or W/wo  Result Date: 07/12/2016 CLINICAL DATA:  61 year old male with history of aortic stenosis. Preprocedural study prior to potential transcatheter aortic valve replacement (TAVR) procedure. EXAM: CT ANGIOGRAPHY CHEST, ABDOMEN AND PELVIS TECHNIQUE: Multidetector CT imaging through the chest, abdomen and pelvis was performed using the standard protocol during bolus administration of intravenous contrast. Multiplanar reconstructed images and MIPs were obtained and reviewed to evaluate the vascular anatomy. CONTRAST:  100 mL of Isovue 370. COMPARISON:  Chest CT 06/26/2002. FINDINGS: CTA CHEST FINDINGS Cardiovascular: Heart size is borderline enlarged with concentric left ventricular hypertrophy. There is no significant pericardial fluid, thickening or pericardial calcification. There is aortic atherosclerosis, as well as atherosclerosis of the great vessels of the mediastinum and the coronary arteries, including calcified atherosclerotic plaque in the distal left main and proximal left circumflex coronary arteries. Mediastinum/Lymph Nodes: No pathologically enlarged mediastinal or hilar lymph nodes. Esophagus is unremarkable in appearance. No axillary lymphadenopathy. Lungs/Pleura: No acute consolidative airspace disease. No pleural effusions. No suspicious appearing pulmonary  nodules or masses. Musculoskeletal/Soft Tissues: There are no aggressive appearing lytic or blastic lesions noted in the visualized portions of the skeleton. CTA ABDOMEN AND PELVIS FINDINGS Hepatobiliary: Well-defined 1.5 cm low -intermediate attenuation (33 HU) lesion in segment 7 of the liver has a benign  appearance most suggestive of a proteinaceous cyst (this is only slightly larger than remote prior study from 2004). No other suspicious appearing hepatic lesions are noted. No intra or extrahepatic biliary ductal dilatation. Gallbladder is normal in appearance. Pancreas: No pancreatic mass. No pancreatic ductal dilatation. No pancreatic or peripancreatic fluid or inflammatory changes. Spleen: Unremarkable. Adrenals/Urinary Tract: Bilateral kidneys and bilateral adrenal glands are normal in appearance. There is no hydroureteronephrosis. Urinary bladder is normal in appearance. Stomach/Bowel: Normal appearance of the stomach. No pathologic dilatation of small bowel or colon. Normal appendix. Vascular/Lymphatic: Aortic atherosclerosis with vascular findings and measurements pertinent to potential TAVR procedure, as detailed below. No aneurysm or dissection noted in the abdominal or pelvic vasculature. The celiac axis, superior mesenteric artery and inferior mesenteric artery and their major branches are all widely patent without definite hemodynamically significant stenosis. Two right-sided renal arteries and a single left renal artery (with early bifurcation) are all widely patent without hemodynamically significant stenosis. Retroaortic left renal vein (normal anatomical variant) incidentally noted. No lymphadenopathy noted in the abdomen or pelvis. Reproductive: Prostate gland and seminal vesicles are unremarkable in appearance. Other: No significant volume of ascites.  No pneumoperitoneum. Musculoskeletal: There are no aggressive appearing lytic or blastic lesions noted in the visualized portions of the  skeleton. VASCULAR MEASUREMENTS PERTINENT TO TAVR: AORTA: Minimal Aortic Diameter -  14.1 x 16.5 mm Severity of Aortic Calcification -  mild RIGHT PELVIS: Right Common Iliac Artery - Minimal Diameter - 10.8 x 9.1 mm Tortuosity - mild Calcification - mild Right External Iliac Artery - Minimal Diameter - 8.7 x 8.1 mm Tortuosity - mild to moderate Calcification - none Right Common Femoral Artery - Minimal Diameter - 8.8 x 7.3 mm Tortuosity - mild Calcification - mild LEFT PELVIS: Left Common Iliac Artery - Minimal Diameter - 8.7 x 9.4 mm Tortuosity - mild Calcification - mild Left External Iliac Artery - Minimal Diameter - 8.0 x 7.6 mm Tortuosity - mild to moderate Calcification - none Left Common Femoral Artery - Minimal Diameter - 9.3 x 8.7 mm Tortuosity - mild Calcification - mild Review of the MIP images confirms the above findings. IMPRESSION: 1. Vascular findings and measurements pertinent to potential TAVR procedure, as detailed above. This patient does appear to have suitable pelvic arterial access bilaterally. 2. Severe thickening and calcification of the aortic valve, compatible with the reported clinical history of severe aortic stenosis. 3. Additional incidental findings, as above. Electronically Signed   By: Trudie Reed M.D.   On: 07/12/2016 17:02    Discharge Instructions    Amb Referral to Cardiac Rehabilitation    Complete by:  As directed    Diagnosis:  Valve Replacement   Valve:  Aortic Comment - min invasive      Discharge Medications: Allergies as of 07/19/2016      Reactions   Bee Venom Swelling   SWELLING REACTION UNSPECIFIED       Medication List    STOP taking these medications   CIALIS 5 MG tablet Generic drug:  tadalafil   dextromethorphan 30 MG/5ML liquid Commonly known as:  DELSYM     TAKE these medications   aspirin EC 81 MG tablet Take 81 mg by mouth daily.   atorvastatin 10 MG tablet Commonly known as:  LIPITOR Take 10 mg by mouth daily.   EPIPEN  2-PAK 0.3 mg/0.3 mL Soaj injection Generic drug:  EPINEPHrine Inject 0.3 mLs into the muscle as directed.   fluticasone 50 MCG/ACT nasal spray Commonly known as:  FLONASE Place 1 spray into the nose as directed.   furosemide 40 MG tablet Commonly known as:  LASIX Take 1 tablet (40 mg total) by mouth daily.   meclizine 25 MG tablet Commonly known as:  ANTIVERT Take 25 mg by mouth as directed.   metoprolol tartrate 25 MG tablet Commonly known as:  LOPRESSOR Take 0.5 tablets (12.5 mg total) by mouth 2 (two) times daily.   potassium chloride 10 MEQ tablet Commonly known as:  K-DUR Take 2 tablets (20 mEq total) by mouth daily.   traMADol 50 MG tablet Commonly known as:  ULTRAM Take 1 tablet (50 mg total) by mouth every 6 (six) hours as needed for moderate pain.       Follow Up Appointments: Follow-up Information    Purcell Nailslarence H Owen, MD Follow up.   Specialty:  Cardiothoracic Surgery Why:  Your appointment is on 3/26 at 2:30pm. Please arrive at 2:00pm for your chest xray [facility] for [facility].  Contact information: [address] [place] [address] [phone]        Sid FalconKALISH, MICHAEL J, MD. Call in 1 day(s).   Specialty:  Family Medicine Contact information: [address] [place] [address] [phone]        Lance MussJayadeep Varanasi, MD. Call in 1 day(s).   Specialties:  Cardiology, Radiology, Interventional Cardiology Why:  Please call for an appointment in a few days if someone from the office has not reached out.  Contact information: [address] [place] [address] [phone]        Theodore DemarkBarrett, Rhonda, PA-C Follow up.   Specialties:  Cardiology, Radiology Why:  Theodore DemarkRhonda Barrett, PA-C 3/15 @2 :30pm([facility] Contact information: [address] [place] [address] [phone]          The patient has been discharged on:   1.Beta Blocker:  Yes [  x ]                               No   [   ]                              If No, reason:  2.Ace Inhibitor/ARB: Yes [   ]                                     No  [   x ]                                     If No, reason: titration of beta blocker  3.Statin:   Yes [ x  ]                  No  [   ]                  If No, reason:  4.Ecasa:  Yes  [  x ]                  No   [   ]                  If  No, reason:   Signed: Tessa N ContePA-C 07/19/2016, 12:34 PM

## 2016-07-16 NOTE — Progress Notes (Signed)
TCTS BRIEF SICU PROGRESS NOTE  Attempts to remove chest tube at bedside by nursing staff and Jari Favreessa Conte unsuccessful.  Anterior chest tube will not move easily and appears to be caught in a suture in the subcutaneous tissues  Plan: Will remove the chest tube in the OR so that sterile technique can be maintained and adequate analgesia can be administered.  Discussed with patient who agrees w/ plan  Purcell Nailslarence H Owen, MD 07/16/2016 12:02 PM

## 2016-07-16 NOTE — Progress Notes (Signed)
Unable to pull CT as per orders of Dr. Cornelius Moraswen.  Dr. Cornelius Moraswen notified and orders received to have PA come and pull CT.  Jari Favreessa Conte also tried to pull CT without success.  Dr. Cornelius Moraswen notified and orders received that patient will go back to OR to have CT removed under anesthesia.  Patient instructed of above.

## 2016-07-16 NOTE — Discharge Instructions (Signed)
Aortic Valve Replacement, Care After °Refer to this sheet in the next few weeks. These instructions provide you with information about caring for yourself after your procedure. Your health care provider may also give you more specific instructions. Your treatment has been planned according to current medical practices, but problems sometimes occur. Call your health care provider if you have any problems or questions after your procedure. °What can I expect after the procedure? °After the procedure, it is common to have: °· Pain around your incision area. °· A small amount of blood or clear fluid coming from your incision. ° °Follow these instructions at home: °Eating and drinking ° °· Follow instructions from your health care provider about eating or drinking restrictions. °? Limit alcohol intake to no more than 1 drink per day for nonpregnant women and 2 drinks per day for men. One drink equals 12 oz of beer, 5 oz of wine, or 1½ oz of hard liquor. °? Limit how much caffeine you drink. Caffeine can affect your heart's rate and rhythm. °· Drink enough fluid to keep your urine clear or pale yellow. °· Eat a heart-healthy diet. This should include plenty of fresh fruits and vegetables. If you eat meat, it should be lean cuts. Avoid foods that are: °? High in salt, saturated fat, or sugar. °? Canned or highly processed. °? Fried. °Activity °· Return to your normal activities as told by your health care provider. Ask your health care provider what activities are safe for you. °· Exercise regularly once you have recovered, as told by your health care provider. °· Avoid sitting for more than 2 hours at a time without moving. Get up and move around at least once every 1-2 hours. This helps to prevent blood clots in the legs. °· Do not lift anything that is heavier than 10 lb (4.5 kg) until your health care provider approves. °· Avoid pushing or pulling things with your arms until your health care provider approves. This  includes pulling on handrails to help you climb stairs. °Incision care ° °· Follow instructions from your health care provider about how to take care of your incision. Make sure you: °? Wash your hands with soap and water before you change your bandage (dressing). If soap and water are not available, use hand sanitizer. °? Change your dressing as told by your health care provider. °? Leave stitches (sutures), skin glue, or adhesive strips in place. These skin closures may need to stay in place for 2 weeks or longer. If adhesive strip edges start to loosen and curl up, you may trim the loose edges. Do not remove adhesive strips completely unless your health care provider tells you to do that. °· Check your incision area every day for signs of infection. Check for: °? More redness, swelling, or pain. °? More fluid or blood. °? Warmth. °? Pus or a bad smell. °Medicines °· Take over-the-counter and prescription medicines only as told by your health care provider. °· If you were prescribed an antibiotic medicine, take it as told by your health care provider. Do not stop taking the antibiotic even if you start to feel better. °Travel °· Avoid airplane travel for as long as told by your health care provider. °· When you travel, bring a list of your medicines and a record of your medical history with you. Carry your medicines with you. °Driving °· Ask your health care provider when it is safe for you to drive. Do not drive until your health   care provider approves. °· Do not drive or operate heavy machinery while taking prescription pain medicine. °Lifestyle ° °· Do not use any tobacco products, such as cigarettes, chewing tobacco, or e-cigarettes. If you need help quitting, ask your health care provider. °· Resume sexual activity as told by your health care provider. Do not use medicines for erectile dysfunction unless your health care provider approves, if this applies. °· Work with your health care provider to keep your  blood pressure and cholesterol under control, and to manage any other heart conditions that you have. °· Maintain a healthy weight. °General instructions °· Do not take baths, swim, or use a hot tub until your health care provider approves. °· Do not strain to have a bowel movement. °· Avoid crossing your legs while sitting down. °· Check your temperature every day for a fever. A fever may be a sign of infection. °· If you are a woman and you plan to become pregnant, talk with your health care provider before you become pregnant. °· Wear compression stockings if your health care provider instructs you to do this. These stockings help to prevent blood clots and reduce swelling in your legs. °· Tell all health care providers who care for you that you have an artificial (prosthetic) aortic valve. If you have or have had heart disease or endocarditis, tell all health care providers about these conditions as well. °· Keep all follow-up visits as told by your health care provider. This is important. °Contact a health care provider if: °· You develop a skin rash. °· You experience sudden, unexplained changes in your weight. °· You have more redness, swelling, or pain around your incision. °· You have more fluid or blood coming from your incision. °· Your incision feels warm to the touch. °· You have pus or a bad smell coming from your incision. °· You have a fever. °Get help right away if: °· You develop chest pain that is different from the pain coming from your incision. °· You develop shortness of breath or difficulty breathing. °· You start to feel light-headed. °These symptoms may represent a serious problem that is an emergency. Do not wait to see if the symptoms will go away. Get medical help right away. Call your local emergency services (911 in the [place] Do not drive yourself to the hospital. °This information is not intended to replace advice given to you by your health care provider. Make sure you discuss any  questions you have with your health care provider. °Document Released: 11/26/2004 Document Revised: 10/16/2015 Document Reviewed: 04/13/2015 °Elsevier Interactive Patient Education © 2017 [employer] ° °

## 2016-07-16 NOTE — Transfer of Care (Signed)
Immediate Anesthesia Transfer of Care Note  Patient: Jerry Odonnell  Procedure(s) Performed: Procedure(s): CHEST TUBE REMOVAL, right chest (N/A)  Patient Location: SICU  Anesthesia Type:MAC  Level of Consciousness: awake and alert   Airway & Oxygen Therapy: Patient Spontanous Breathing  Post-op Assessment: Report given to RN and Post -op Vital signs reviewed and stable  Post vital signs: Reviewed and stable  Last Vitals:  Vitals:   07/16/16 1000 07/16/16 1100  BP:  112/61  Pulse: 71 69  Resp: (!) 22 (!) 4  Temp:      Last Pain:  Vitals:   07/16/16 0951  TempSrc:   PainSc: 3       Patients Stated Pain Goal: 3 (57/84/69 6295)  Complications: No apparent anesthesia complications

## 2016-07-17 ENCOUNTER — Inpatient Hospital Stay (HOSPITAL_COMMUNITY): Payer: BLUE CROSS/BLUE SHIELD

## 2016-07-17 LAB — CBC
HCT: 25.3 % — ABNORMAL LOW (ref 39.0–52.0)
HEMOGLOBIN: 8.8 g/dL — AB (ref 13.0–17.0)
MCH: 30.9 pg (ref 26.0–34.0)
MCHC: 34.8 g/dL (ref 30.0–36.0)
MCV: 88.8 fL (ref 78.0–100.0)
PLATELETS: 72 10*3/uL — AB (ref 150–400)
RBC: 2.85 MIL/uL — AB (ref 4.22–5.81)
RDW: 14.9 % (ref 11.5–15.5)
WBC: 8.8 10*3/uL (ref 4.0–10.5)

## 2016-07-17 LAB — BASIC METABOLIC PANEL
ANION GAP: 7 (ref 5–15)
BUN: 20 mg/dL (ref 6–20)
CHLORIDE: 103 mmol/L (ref 101–111)
CO2: 23 mmol/L (ref 22–32)
Calcium: 8.1 mg/dL — ABNORMAL LOW (ref 8.9–10.3)
Creatinine, Ser: 1.03 mg/dL (ref 0.61–1.24)
GFR calc Af Amer: >60 mL/min (ref >60)
GLUCOSE: 118 mg/dL — AB (ref 65–99)
POTASSIUM: 4.2 mmol/L (ref 3.5–5.1)
SODIUM: 133 mmol/L — AB (ref 135–145)

## 2016-07-17 LAB — GLUCOSE, CAPILLARY
GLUCOSE-CAPILLARY: 90 mg/dL (ref 65–99)
GLUCOSE-CAPILLARY: 99 mg/dL (ref 65–99)
GLUCOSE-CAPILLARY: 111 mg/dL — AB (ref 65–99)

## 2016-07-17 NOTE — Progress Notes (Signed)
Patient ID: Jerry Odonnell, male   DOB: April 12, 1956, 61 y.o.   MRN: [id number] TCTS DAILY ICU PROGRESS NOTE                   [address]            [place] [id number]          [phone]   1 Day Post-Op Procedure(s) (LRB): CHEST TUBE REMOVAL, right chest (N/A)  Total Length of Stay:  LOS: 7 days   Subjective: Stable up in chair , no complaints   Objective: Vital signs in last 24 hours: Temp:  [97.8 F (36.6 C)-99.2 F (37.3 C)] 98.5 F (36.9 C) (02/24 0821) Pulse Rate:  [63-73] 66 (02/23 1530) Cardiac Rhythm: Normal sinus rhythm (02/24 1200) Resp:  [15-26] 15 (02/24 1300) BP: (83-123)/(47-75) 110/60 (02/24 1300) SpO2:  [90 %-100 %] 100 % (02/24 1200) Weight:  [205 lb 4 oz (93.1 kg)] 205 lb 4 oz (93.1 kg) (02/24 0600)  Filed Weights   07/15/16 0500 07/16/16 0600 07/17/16 0600  Weight: 200 lb 9.9 oz (91 kg) 208 lb 12.4 oz (94.7 kg) 205 lb 4 oz (93.1 kg)    Weight change: -3 lb 8.4 oz (-1.6 kg)   Hemodynamic parameters for last 24 hours:    Intake/Output from previous day: 02/23 0701 - 02/24 0700 In: 1030 [P.O.:780; I.V.:200; IV Piggyback:50] Out: 1815 [Urine:1535; Chest Tube:280]  Intake/Output this shift: Total I/O In: 720 [P.O.:720] Out: 950 [Urine:950]  Current Meds: Scheduled Meds: . acetaminophen  1,000 mg Oral Q6H  . aspirin EC  81 mg Oral Daily  . atorvastatin  10 mg Oral q1800  . bisacodyl  10 mg Oral Daily   Or  . bisacodyl  10 mg Rectal Daily  . docusate sodium  200 mg Oral Daily  . furosemide  40 mg Oral Daily  . metoprolol tartrate  12.5 mg Oral BID  . moving right along book   Does not apply Once  . pantoprazole  40 mg Oral Daily  . potassium chloride (KCL MULTIRUN) 30 mEq in 265 mL IVPB  30 mEq Intravenous Once  . potassium chloride  20 mEq Oral Daily  . sodium chloride flush  3 mL Intravenous Q12H  . sodium chloride flush  3 mL Intravenous Q12H   Continuous Infusions: . sodium chloride     PRN Meds:.sodium  chloride, metoprolol, morphine injection, ondansetron (ZOFRAN) IV, oxyCODONE, sodium chloride flush, sodium chloride flush, traMADol  General appearance: alert and cooperative Neurologic: intact Heart: regular rate and rhythm, S1, S2 normal, no murmur, click, rub or gallop Lungs: clear to auscultation bilaterally Abdomen: soft, non-tender; bowel sounds normal; no masses,  no organomegaly Extremities:  No swelling  Wound: intact  Lab Results: CBC: Recent Labs  07/16/16 0426 07/17/16 0216  WBC 9.2 8.8  HGB 9.1* 8.8*  HCT 26.0* 25.3*  PLT 68* 72*   BMET:  Recent Labs  07/16/16 0426 07/17/16 0216  NA 131* 133*  K 3.5 4.2  CL 103 103  CO2 24 23  GLUCOSE 102* 118*  BUN 24* 20  CREATININE 1.06 1.03  CALCIUM 8.1* 8.1*    CMET: Lab Results  Component Value Date   WBC 8.8 07/17/2016   HGB 8.8 (L) 07/17/2016   HCT 25.3 (L) 07/17/2016   PLT 72 (L) 07/17/2016   GLUCOSE 118 (H) 07/17/2016   ALT 42 07/09/2016   AST 28 07/09/2016   NA 133 (L) 07/17/2016   K  4.2 07/17/2016   CL 103 07/17/2016   CREATININE 1.03 07/17/2016   BUN 20 07/17/2016   CO2 23 07/17/2016   TSH 0.415 07/09/2016   INR 1.79 07/14/2016   HGBA1C 5.4 07/09/2016      PT/INR:  Recent Labs  07/14/16 2200  LABPROT 21.1*  INR 1.79   Radiology: Dg Chest 2 View  Result Date: 07/17/2016 CLINICAL DATA:  Atelectasis. EXAM: CHEST  2 VIEW COMPARISON:  07/16/2016 and CT chest 07/12/2016. FINDINGS: Trachea is midline. Heart size stable. Right IJ catheter sheath has been removed. Aortic valve replacement. Mild scattered mid and lower lung zone airspace opacification with streaky densities. Tiny bilateral pleural effusions. Subcutaneous emphysema along the right chest wall. Plate and screws overlie the mid chest. IMPRESSION: 1. Probable scattered atelectasis in the mid and lower lung zones. Difficult to exclude edema or developing pneumonia. 2. Tiny bilateral pleural effusions. Electronically Signed   By: Leanna Battles M.D.   On: 07/17/2016 08:23   Dg Chest 1v Repeat Same Day  Result Date: 07/16/2016 CLINICAL DATA:  Chest tube removal. EXAM: CHEST - 1 VIEW SAME DAY COMPARISON:  One-view chest x-ray from the same day. FINDINGS: The heart size normal. Aortic valve replacement is noted. Right IJ sheath remains. The right-sided chest tube is been remain. There is no pneumothorax. Scarring or atelectasis at the right lung is stable. Subcutaneous air is present. Small bilateral pleural effusions are stable. Aeration in the left lung is improved. Pulmonary vascular congestion is improved. IMPRESSION: The 1. Interval removal of right-sided chest tube without pneumothorax. 2. Stable cardiomegaly with improved pulmonary vascular congestion improved aeration of both lungs. 3. Pneumomediastinum over the right chest likely related to removal of chest tube. Electronically Signed   By: Marin Roberts M.D.   On: 07/16/2016 14:28     Assessment/Plan: S/P Procedure(s) (LRB): CHEST TUBE REMOVAL, right chest (N/A) Mobilize Plan for transfer to step-down: see transfer orders Waiting for bed step down     Delight Ovens 07/17/2016 1:35 PM

## 2016-07-17 NOTE — Progress Notes (Signed)
Pt received from 2S RN. Pt oriented to room and equipment. VSS. Telemetry applied, CCMD notified.   Leonidas Rombergaitlin S Bumbledare, RN

## 2016-07-17 NOTE — Anesthesia Postprocedure Evaluation (Addendum)
Anesthesia Post Note  Patient: Jerry Odonnell  Procedure(s) Performed: Procedure(s) (LRB): CHEST TUBE REMOVAL, right chest (N/A)  Patient location during evaluation: PACU Anesthesia Type: MAC Level of consciousness: awake Pain management: pain level controlled Vital Signs Assessment: post-procedure vital signs reviewed and stable Respiratory status: spontaneous breathing, nonlabored ventilation, respiratory function stable and patient connected to nasal cannula oxygen Cardiovascular status: stable and blood pressure returned to baseline Anesthetic complications: no       Last Vitals:  Vitals:   07/17/16 0700 07/17/16 0821  BP: 113/67   Pulse:    Resp: 17   Temp:  36.9 C    Last Pain:  Vitals:   07/17/16 0821  TempSrc: Oral  PainSc:                  Amenah Tucci

## 2016-07-17 NOTE — Anesthesia Preprocedure Evaluation (Signed)
Anesthesia Evaluation  Patient identified by MRN, date of birth, ID band Patient awake    Reviewed: Allergy & Precautions, NPO status , Patient's Chart, lab work & pertinent test results  History of Anesthesia Complications Negative for: history of anesthetic complications  Airway Mallampati: II  TM Distance: >3 FB Neck ROM: Full    Dental  (+) Chipped, Dental Advisory Given   Pulmonary neg pulmonary ROS,    breath sounds clear to auscultation       Cardiovascular (-) CAD (minimal ASCADz) Valvular problems/murmurs: critical aortic stenosis.  Rhythm:Regular Rate:Normal + Systolic murmurs 7/34/19 ECHO: EF 60-65%, critical aortic stenosis with mild regurgitation. Valve area (VTI): 0.68 cm^2. Valve area (Vmax): 0.71 cm^2. Valve area (Vmean): 0.62 cm^2. Mean gradient (S): 88 mm Hg. Peak gradient (S): 133 mm Hg.   Neuro/Psych Syncope: critical aortic stenosis TIA   GI/Hepatic negative GI ROS, Neg liver ROS,   Endo/Other  negative endocrine ROS  Renal/GU negative Renal ROS     Musculoskeletal   Abdominal   Peds  Hematology Hb 15.1, plt 133k   Anesthesia Other Findings   Reproductive/Obstetrics                             Anesthesia Physical Anesthesia Plan  ASA: III  Anesthesia Plan: MAC   Post-op Pain Management:    Induction:   Airway Management Planned: Natural Airway, Nasal Cannula and Simple Face Mask  Additional Equipment:   Intra-op Plan:   Post-operative Plan:   Informed Consent: I have reviewed the patients History and Physical, chart, labs and discussed the procedure including the risks, benefits and alternatives for the proposed anesthesia with the patient or authorized representative who has indicated his/her understanding and acceptance.   Dental advisory given  Plan Discussed with: CRNA and Surgeon  Anesthesia Plan Comments:         Anesthesia Quick  Evaluation

## 2016-07-18 MED ORDER — ACETAMINOPHEN 500 MG PO TABS
1000.0000 mg | ORAL_TABLET | Freq: Four times a day (QID) | ORAL | Status: DC
  Administered 2016-07-19 (×2): 1000 mg via ORAL
  Filled 2016-07-18 (×2): qty 2

## 2016-07-18 NOTE — Progress Notes (Addendum)
      [address]       [place] [id number]             [phone]        2 Days Post-Op Procedure(s) (LRB): CHEST TUBE REMOVAL, right chest (N/A)  Subjective: Patient requesting to shower.  Objective: Vital signs in last 24 hours: Temp:  [98.5 F (36.9 C)-98.6 F (37 C)] 98.5 F (36.9 C) (02/25 0522) Pulse Rate:  [61-84] 61 (02/25 0522) Cardiac Rhythm: Normal sinus rhythm;Bundle branch block (02/25 0721) Resp:  [15-21] 18 (02/25 0522) BP: (94-123)/(53-75) 100/53 (02/25 0522) SpO2:  [95 %-100 %] 98 % (02/25 0522) Weight:  [200 lb 14.4 oz (91.1 kg)] 200 lb 14.4 oz (91.1 kg) (02/25 0522)  Pre op weight 86 kg Current Weight  07/18/16 200 lb 14.4 oz (91.1 kg)      Intake/Output from previous day: 02/24 0701 - 02/25 0700 In: 720 [P.O.:720] Out: 1825 [Urine:1825]   Physical Exam:  Cardiovascular: RRR Pulmonary: Clear to auscultation bilaterally Abdomen: Soft, non tender, bowel sounds present. Extremities: Mild bilateral lower extremity edema. Wound: Clean and dry.  No erythema or signs of infection.  Lab Results: CBC: Recent Labs  07/16/16 0426 07/17/16 0216  WBC 9.2 8.8  HGB 9.1* 8.8*  HCT 26.0* 25.3*  PLT 68* 72*   BMET:  Recent Labs  07/16/16 0426 07/17/16 0216  NA 131* 133*  K 3.5 4.2  CL 103 103  CO2 24 23  GLUCOSE 102* 118*  BUN 24* 20  CREATININE 1.06 1.03  CALCIUM 8.1* 8.1*    PT/INR:  Lab Results  Component Value Date   INR 1.79 07/14/2016   INR 1.61 07/14/2016   INR 1.14 07/13/2016   ABG:  INR: Will add last result for INR, ABG once components are confirmed Will add last 4 CBG results once components are confirmed  Assessment/Plan:  1. CV - HR in the 60's. On Lopressor 12.5 mg bid. 2.  Pulmonary - On room air. CXR not taken today-ordered for am. Encourage incentive spirometer. 3. Volume Overload - On Lasix 40 mg daily. 4.  Acute blood loss anemia - H and H yesterday 8.8 and 25.3 5.  Thrombocytopenia-platelets yesterday 72,000. On baby ecasa and not Lovenox Will check CBC in am 6. Instructed patient he may wash up at sink and take a shower when he gets home. 7. Possibly home 1-2 days  ZIMMERMAN,Jerry Odonnell 07/18/2016,7:37 AM  Patient son is coming Tuesday, He has support at home if goes home Monday History of chronic low plts  I have seen and examined Jerry Odonnell and agree with the above assessment  and plan.  Jerry OvensEdward B Adaleena Mooers MD [facility] [phone] Office [phone] 07/18/2016 10:59 AM

## 2016-07-19 ENCOUNTER — Telehealth: Payer: Self-pay | Admitting: Cardiovascular Disease

## 2016-07-19 ENCOUNTER — Encounter (HOSPITAL_COMMUNITY): Payer: Self-pay | Admitting: Thoracic Surgery (Cardiothoracic Vascular Surgery)

## 2016-07-19 ENCOUNTER — Inpatient Hospital Stay (HOSPITAL_COMMUNITY): Payer: BLUE CROSS/BLUE SHIELD

## 2016-07-19 LAB — CBC
HEMATOCRIT: 24.8 % — AB (ref 39.0–52.0)
Hemoglobin: 8.4 g/dL — ABNORMAL LOW (ref 13.0–17.0)
MCH: 30.7 pg (ref 26.0–34.0)
MCHC: 33.9 g/dL (ref 30.0–36.0)
MCV: 90.5 fL (ref 78.0–100.0)
Platelets: 112 10*3/uL — ABNORMAL LOW (ref 150–400)
RBC: 2.74 MIL/uL — AB (ref 4.22–5.81)
RDW: 14.9 % (ref 11.5–15.5)
WBC: 7.2 10*3/uL (ref 4.0–10.5)

## 2016-07-19 MED ORDER — POTASSIUM CHLORIDE ER 10 MEQ PO TBCR: 20.0000 meq | EXTENDED_RELEASE_TABLET | Freq: Every day | ORAL | 0 refills | Status: DC

## 2016-07-19 MED ORDER — FUROSEMIDE 40 MG PO TABS: 40.0000 mg | ORAL_TABLET | Freq: Every day | ORAL | 0 refills | Status: DC

## 2016-07-19 MED ORDER — TRAMADOL HCL 50 MG PO TABS: 50.0000 mg | ORAL_TABLET | Freq: Four times a day (QID) | ORAL | 0 refills | Status: DC | PRN

## 2016-07-19 MED ORDER — METOPROLOL TARTRATE 25 MG PO TABS: 12.5000 mg | ORAL_TABLET | Freq: Two times a day (BID) | ORAL | 1 refills | Status: DC

## 2016-07-19 NOTE — Care Management Note (Signed)
Case Management Note Initial Note Created by Letha Capeeborah Taylor 07/09/16  Patient Details  Name: Lorene DyDirk Cooper Haskett MRN: [id number] Date of Birth: 1956-02-22  Subjective/Objective:  S/p heart cath, has severe aortic stenosis, ct consulted for aortic stenosis surgery, patient lives alone, he has a pcp, he will have transportation at [employer] by his friend Fannie KneeSue , he has medication coverage.  NCM will cont to follow for dc needs.                Action/Plan:   Expected Discharge Date:  07/19/16               Expected Discharge Plan:  Home/Self Care  In-House Referral:     Discharge planning Services  CM Consult  Post Acute Care Choice:  NA Choice offered to:  NA  DME Arranged:    DME Agency:     HH Arranged:    HH Agency:     Status of Service:  Completed, signed off  If discussed at [employer] Length of Stay Meetings, dates discussed:    Discharge Disposition: home/self care   Additional Comments:  07/19/16- 1230-  Reizy Dunlow RN, CM- pt for d/c home today- daughter to be with pt on discharge until son arrives- no CM needs noted for d/c.    Cherylann ParrClaxton, Samantha S, RN 07/16/2016, 9:50 AM--Daughter at bedside - states she will provide 24 hour supervision at discharge until son arrives - pt in agreement  Pt is alert and orient - now s/p MVR.  Pt verified that PTA he was completely independent from home alone.  Pts son will come to stay with him on Tuesday 2/27- pt is interested in private duty nursing if 24 hour care is recommended at discharge until son arrives.  Bedside nurse to request clarification regarding recommended supervision at discharge - CM also left attending sticky note requesting clarification.  CM will provide private duty nursing list.    Darrold SpanWebster, Arionne Iams Hall, RN 07/19/2016, 12:38 PM [phone]

## 2016-07-19 NOTE — Telephone Encounter (Signed)
New Message   Pt wife called to cancel his ECHO scheduled for tomorrow 07/20/16. Pt wife states she believes it is not needed but wants to be sure that this is true and requests a call back from someone who can tell her and confirm that.

## 2016-07-19 NOTE — Progress Notes (Signed)
CARDIAC REHAB PHASE I   Pt just returned from ambulating independently, no complaints, states he has been ambulating independently for greater than 24 hrs, declines additional ambulation at this time. Cardiac surgery discharge education completed. Reviewed IS, restrictions, activity progression, exercise, heart healthy diet, daily weights, and phase 2 cardiac rehab. Pt verbalized understanding. Pt agrees to phase 2 cardiac rehab referral, [facility]. Pt in recliner, call bell within reach, states his ride will be here at 1330.   [phone] Joylene GrapesEmily C Tory Septer, RN, BSN 07/19/2016 9:39 AM

## 2016-07-19 NOTE — Progress Notes (Signed)
   Rhythm is stable. No active cardiac issues. Ambulating.

## 2016-07-19 NOTE — Progress Notes (Addendum)
      [address]       [place] [id number]             [phone]      3 Days Post-Op Procedure(s) (LRB): CHEST TUBE REMOVAL, right chest (N/A) Subjective: No issues over the weekend. Sitting comfortably sipping coffee.   Objective: Vital signs in last 24 hours: Temp:  [97.5 F (36.4 C)-98.5 F (36.9 C)] 97.5 F (36.4 C) (02/26 0621) Pulse Rate:  [66-76] 68 (02/26 0621) Cardiac Rhythm: Normal sinus rhythm;Bundle branch block (02/26 0715) Resp:  [18-21] 18 (02/26 0621) BP: (105-113)/(53-64) 113/63 (02/26 0621) SpO2:  [99 %-100 %] 99 % (02/26 0621) Weight:  [90.3 kg (199 lb)] 90.3 kg (199 lb) (02/26 0621)     Intake/Output from previous day: 02/25 0701 - 02/26 0700 In: 720 [P.O.:720] Out: 3200 [Urine:3200] Intake/Output this shift: No intake/output data recorded.  General appearance: alert, cooperative and no distress Heart: regular rate and rhythm, S1, S2 normal, no murmur, click, rub or gallop Lungs: clear to auscultation bilaterally Abdomen: soft, non-tender; bowel sounds normal; no masses,  no organomegaly Extremities: edema 1+ non-pitting pedal edema Wound: clean and dry  Lab Results:  Recent Labs  07/17/16 0216 07/19/16 0205  WBC 8.8 7.2  HGB 8.8* 8.4*  HCT 25.3* 24.8*  PLT 72* 112*   BMET:  Recent Labs  07/17/16 0216  NA 133*  K 4.2  CL 103  CO2 23  GLUCOSE 118*  BUN 20  CREATININE 1.03  CALCIUM 8.1*    PT/INR: No results for input(s): LABPROT, INR in the last 72 hours. ABG    Component Value Date/Time   PHART 7.363 07/15/2016 0608   HCO3 18.6 (L) 07/15/2016 0608   TCO2 20 07/15/2016 1619   ACIDBASEDEF 6.0 (H) 07/15/2016 0608   O2SAT 97.0 07/15/2016 0608   CBG (last 3)   Recent Labs  07/17/16 0403 07/17/16 0819 07/17/16 1208  GLUCAP 90 99 111*    Assessment/Plan: S/P Procedure(s) (LRB): CHEST TUBE REMOVAL, right chest (N/A)  1. CV - HR in the 60's. On Lopressor 12.5 mg bid. 2.  Pulmonary - On room air.  CXR with a persistent right perihilar and infrahilar atelectasis. Persistent small bilateral pleural effusions.  Encourage incentive spirometer. 3. Volume Overload - On Lasix 40 mg daily. 4.  Acute blood loss anemia - H and H yesterday 8.4 and 24.8 5. Thrombocytopenia-platelets yesterday 112,000. On baby ecasa and not Lovenox Plan: Platelets have improved and hemoglobin stable. Possible discharge today.  Still about 9lbs over baseline weight. Will likely need another 2 weeks of lasix.     LOS: 9 days    Sharlene Doryessa N Conte 07/19/2016   I have seen and examined the patient and agree with the assessment and plan as outlined.  D/C home  Purcell Nailslarence H Owen, MD 07/19/2016 8:36 AM

## 2016-07-19 NOTE — Telephone Encounter (Signed)
Pt states he already had bilateral carotid study done before his aortic valve replacement and this is why he has cancelled. This call is not about an ECHO

## 2016-07-20 ENCOUNTER — Telehealth (HOSPITAL_COMMUNITY): Payer: Self-pay | Admitting: Family Medicine

## 2016-07-20 ENCOUNTER — Encounter (HOSPITAL_COMMUNITY): Payer: BLUE CROSS/BLUE SHIELD

## 2016-07-20 NOTE — Telephone Encounter (Signed)
Verified with [employer] insurance benefits No Co-Pay, Deductible $0.00,  Out Of Pocket $2000.00, patient has met $417.00 Patient's responsibility is $1583.00 Reference # [id number], KJ

## 2016-07-21 ENCOUNTER — Other Ambulatory Visit: Payer: Self-pay | Admitting: *Deleted

## 2016-07-21 DIAGNOSIS — Z79899 Other long term (current) drug therapy: Secondary | ICD-10-CM

## 2016-07-21 MED ORDER — POTASSIUM CHLORIDE ER 10 MEQ PO TBCR: 20.0000 meq | EXTENDED_RELEASE_TABLET | Freq: Every day | ORAL | 0 refills | Status: DC

## 2016-07-22 ENCOUNTER — Telehealth: Payer: Self-pay | Admitting: Physician Assistant

## 2016-07-22 ENCOUNTER — Telehealth: Payer: Self-pay | Admitting: Surgical

## 2016-07-22 ENCOUNTER — Telehealth (HOSPITAL_COMMUNITY): Payer: Self-pay | Admitting: Physician Assistant

## 2016-07-22 NOTE — Telephone Encounter (Signed)
      [address]       [place] [id number]             [phone]     Telephone call attempted to both home and mobile phone numbers. Only voice mail response. Did not leave a message.  Melenie Minniear E, PA-C

## 2016-07-22 NOTE — Telephone Encounter (Signed)
New message      FYI Patient is calling to let us know that we will be contacted by [employer] disability for information regarding his FMLA claim.

## 2016-07-22 NOTE — Telephone Encounter (Signed)
      [address]       [place] [id number]             [phone]    Cindy HazyDirk Cooper Stuckert [phone]   S/P minimally invasive AVR performed on 07/14/2016.  Discharged home on 07/19/2016  Medications:  Current Outpatient Prescriptions on File Prior to Visit  Medication Sig Dispense Refill  . aspirin EC 81 MG tablet Take 81 mg by mouth daily.    [name]. atorvastatin (LIPITOR) 10 MG tablet Take 10 mg by mouth daily.    [name]. EPINEPHrine (EPIPEN 2-PAK) 0.3 mg/0.3 mL IJ SOAJ injection Inject 0.3 mLs into the muscle as directed.    . fluticasone (FLONASE) 50 MCG/ACT nasal spray Place 1 spray into the nose as directed.    . furosemide (LASIX) 40 MG tablet Take 1 tablet (40 mg total) by mouth daily. 7 tablet 0  . meclizine (ANTIVERT) 25 MG tablet Take 25 mg by mouth as directed.    . metoprolol tartrate (LOPRESSOR) 25 MG tablet Take 0.5 tablets (12.5 mg total) by mouth 2 (two) times daily. 30 tablet 1  . potassium chloride (K-DUR) 10 MEQ tablet Take 2 tablets (20 mEq total) by mouth daily. 9 tablet 0  . traMADol (ULTRAM) 50 MG tablet Take 1 tablet (50 mg total) by mouth every 6 (six) hours as needed for moderate pain. 30 tablet 0   No current facility-administered medications on file prior to visit.    Coumadin:  INR check Yes/No  Problems/Concerns: He says he is able to use his spirometer and is breathing better when he is walking. His weight is trending down and he is at his baseline weight. He has 4 more pills of the Lasix and I told him to finish the prescription. He was able to get all his prescriptions without issue. He does have some nasal drainage which he is treating with saline and flonase. I said that mucinex is helpful for chest congestion. He is encouraged to reach out to his PCP if symptoms continue. He is feeling better each day and is only taking one pain pill at night for sleep. I moved up his appointment to 3/12 and the office was made aware of this. He knows about his  follow-up with his cardiologist. He had no other questions at this time.   Assessment:  Patient is doing well.   Education provided.  Contact office if concerns or problems develop  Follow up Appointment:   08/02/2016 at 1:00pm, please arrive at 12:30pm [facility]

## 2016-07-23 ENCOUNTER — Telehealth: Payer: Self-pay | Admitting: Physician Assistant

## 2016-07-23 NOTE — Telephone Encounter (Signed)
Returned call to patient he stated has been in hospital and just wanted us to know we will be receiving disability paper work from [employer].Advised I will let our medical record dept know.

## 2016-07-23 NOTE — Telephone Encounter (Signed)
07/23/2016 Received [employer] Forms ( [employer]) [facility]. cbr

## 2016-07-23 NOTE — Telephone Encounter (Signed)
Follow up     Calling to verify dates of service for disability claim.  Please use claim number [id number] when calling.

## 2016-07-28 ENCOUNTER — Other Ambulatory Visit: Payer: Self-pay | Admitting: *Deleted

## 2016-07-28 ENCOUNTER — Ambulatory Visit (INDEPENDENT_AMBULATORY_CARE_PROVIDER_SITE_OTHER): Payer: Self-pay | Admitting: Surgical

## 2016-07-28 ENCOUNTER — Ambulatory Visit
Admission: RE | Admit: 2016-07-28 | Discharge: 2016-07-28 | Disposition: A | Discharge status: Home / Self Care | Payer: BLUE CROSS/BLUE SHIELD | Source: Ambulatory Visit | Attending: Thoracic Surgery (Cardiothoracic Vascular Surgery) | Admitting: Thoracic Surgery (Cardiothoracic Vascular Surgery)

## 2016-07-28 VITALS — BP 128/70 | HR 87 | Temp 97.2°F | Resp 16 | Ht 71.5 in | Wt 185.0 lb

## 2016-07-28 DIAGNOSIS — R058 Other specified cough: Secondary | ICD-10-CM

## 2016-07-28 DIAGNOSIS — R05 Cough: Secondary | ICD-10-CM | POA: Diagnosis not present

## 2016-07-28 DIAGNOSIS — R0982 Postnasal drip: Secondary | ICD-10-CM

## 2016-07-28 DIAGNOSIS — Z952 Presence of prosthetic heart valve: Secondary | ICD-10-CM

## 2016-07-28 DIAGNOSIS — I350 Nonrheumatic aortic (valve) stenosis: Secondary | ICD-10-CM

## 2016-07-28 NOTE — Patient Instructions (Signed)
Ureteral instructions regarding the use of Mucinex, Flonase, and Claritin.

## 2016-07-28 NOTE — Progress Notes (Signed)
[address]       [place] [id number]             [phone]                  Evalina FieldDirk Cooper Henandez [place] Medical Record #[id number] Date of Birth: [birth year]  Referring MW:UXLKGMD:Kalish, Casimiro NeedleMichael, MD Primary Cardiology: Primary Care:KALISH, Nolon BussingMICHAEL J, MD  Chief Complaint:  Follow Up Visit Surgery    [] Hide copied text CARDIOTHORACIC SURGERY OPERATIVE NOTE  Date of Procedure:                07/14/2016  Preoperative Diagnosis:      Severe Aortic Stenosis   Postoperative Diagnosis:    Same   Procedure:       ? Minimally Invasive Aortic Valve Replacement                   Right Anterior Mini Thoracotomy Approach             Edwards Intuity Elite Rapid Deployment Pericardial Tissue Valve (size 23mm, model #8300AB, serial [id number])              Surgeon:        Salvatore Decentlarence H. Cornelius Moraswen, MD  Assistant:       Jari Favreessa Conte, PA-C  Anesthesia:    Germaine PomfretE. Carswell Jackson, MD  Operative Findings:  Bicuspid native aortic valve with severe aortic stenosis  Normal LV systolic function  Moderate LV hypertrophy      History of Present Illness:    The patient is a 61 year old male status post the above described procedure will call the office to be seen because he was concerned about a low-grade temperature and persistent cough. He reports that he had a maximum temperature of 99.5 and has a persistent occasionally productive cough. He feels as though he has a lot of sinus congestion and postnasal drip as well as. He feels like he has to frequently clear his throat. He is able to do his routine activities and today he walked on a treadmill for 20 minutes without shortness of breath. His pain is well-controlled. He denies chills or other constitutional symptoms. He has had no difficulty with his wounds. He stopped his Lasix on his own because he said he was 5 pounds below his preoperative weight and is not having any swelling in his lower extremities.   Zubrod Score: At  the time of surgery this patient's most appropriate activity status/level should be described as: []     0    Normal activity, no symptoms []     1    Restricted in physical strenuous activity but ambulatory, able to do out light work []     2    Ambulatory and capable of self care, unable to do work activities, up and about                 >50 % of waking hours                                                                                   []     3    Only  limited self care, in bed greater than 50% of waking hours []     4    Completely disabled, no self care, confined to bed or chair []     5    Moribund  History  Smoking Status  . Never Smoker  Smokeless Tobacco  . Never Used       Allergies  Allergen Reactions  . Bee Venom Swelling    SWELLING REACTION UNSPECIFIED     Current Outpatient Prescriptions  Medication Sig Dispense Refill  . aspirin EC 81 MG tablet Take 81 mg by mouth daily.    [name] atorvastatin (LIPITOR) 10 MG tablet Take 10 mg by mouth daily.    [name] EPINEPHrine (EPIPEN 2-PAK) 0.3 mg/0.3 mL IJ SOAJ injection Inject 0.3 mLs into the muscle as directed.    . fluticasone (FLONASE) 50 MCG/ACT nasal spray Place 1 spray into the nose as directed.    . furosemide (LASIX) 40 MG tablet Take 1 tablet (40 mg total) by mouth daily. 7 tablet 0  . meclizine (ANTIVERT) 25 MG tablet Take 25 mg by mouth as directed.    . metoprolol tartrate (LOPRESSOR) 25 MG tablet Take 0.5 tablets (12.5 mg total) by mouth 2 (two) times daily. 30 tablet 1  . potassium chloride (K-DUR) 10 MEQ tablet Take 2 tablets (20 mEq total) by mouth daily. 9 tablet 0  . traMADol (ULTRAM) 50 MG tablet Take 1 tablet (50 mg total) by mouth every 6 (six) hours as needed for moderate pain. 30 tablet 0   No current facility-administered medications for this visit.        Physical Exam: BP 128/70 (BP Location: Right Arm, Patient Position: Sitting, Cuff Size: Large)   Pulse 87   Temp 97.2 F (36.2 C)   Resp 16   Ht 5'  11.5" (1.816 m)   Wt 185 lb (83.9 kg)   SpO2 95% Comment: ON RA  BMI 25.44 kg/m   General appearance: alert, cooperative and no distress Heart: regular rate and rhythm and soft systolic murmur Lungs: clear to auscultation bilaterally Extremities: no edema Wound: incis healing well without evidence of infection Wounds:  Diagnostic Studies & Laboratory data:         Recent Radiology Findings: Dg Chest 2 View  Result Date: 07/28/2016 CLINICAL DATA:  61 year old male with a history of cough. Status post aortic valve replacement 07/14/2016 EXAM: CHEST  2 VIEW COMPARISON:  Multiple prior chest x-ray most recent 07/19/2016. Most recent chest CT 07/12/2016, 06/26/2002 FINDINGS: Cardiomediastinal silhouette unchanged. Surgical changes of mini thoracotomy for minimally invasive aortic valve replacement. Surgical clips in the right hilum. Compare to the prior there is persisting opacity at the right base partially obscuring the right hemidiaphragm in the right heart border. On the lateral view there is meniscus in the costophrenic sulcus opacifying the lung base. No interlobular septal thickening. No pneumothorax. No confluent airspace disease. Improved opacity at the left base, with residual fluid blunting the left costophrenic sulcus and costophrenic angle. IMPRESSION: Persisting opacity at the right lung base compatible with small pleural effusion and associated atelectasis/ consolidation. There is improved left pleural effusion, with trace fluid in the costophrenic sulcus. Surgical changes of mini thoracotomy and aortic valve repair. Electronically Signed   By: Gilmer Mor D.O.   On: 07/28/2016 13:00      I have independently reviewed the above radiology findings and reviewed findings  with the patient.  Recent Labs: Lab Results  Component Value Date   WBC 7.2 07/19/2016  HGB 8.4 (L) 07/19/2016   HCT 24.8 (L) 07/19/2016   PLT 112 (L) 07/19/2016   GLUCOSE 118 (H) 07/17/2016   ALT 42  07/09/2016   AST 28 07/09/2016   NA 133 (L) 07/17/2016   K 4.2 07/17/2016   CL 103 07/17/2016   CREATININE 1.03 07/17/2016   BUN 20 07/17/2016   CO2 23 07/17/2016   TSH 0.415 07/09/2016   INR 1.79 07/14/2016   HGBA1C 5.4 07/09/2016      Assessment / Plan:  The patient is overall doing well. He does appear to have some sinusitis and postnasal drip. He has a dry to slightly productive cough at times. I discussed use of his Flonase on a more regular basis for the next week or so as well as starting an antihistamine such as Claritin. He is also to try Mucinex DM for relief of symptoms. His x-ray results are noted above and actually looks improved in regard to airspace disease. He does have a small to moderate right effusion which needs to be followed as he could potentially require thoracentesis. He is not having any significant shortness of breath. He is afebrile the office today. He is scheduled to be seen again on the twelfth and we will keep this appointment for close follow-up.       Izabelle Daus E 07/28/2016 1:03 PM

## 2016-07-29 ENCOUNTER — Other Ambulatory Visit: Payer: Self-pay | Admitting: Thoracic Surgery (Cardiothoracic Vascular Surgery)

## 2016-07-29 DIAGNOSIS — Z952 Presence of prosthetic heart valve: Secondary | ICD-10-CM

## 2016-07-30 ENCOUNTER — Telehealth: Payer: Self-pay | Admitting: Cardiovascular Disease

## 2016-07-30 NOTE — Telephone Encounter (Signed)
07/30/2016 Received FMLA Forms from [employer] , [facility] Allyson SabalBerry nurse. cbr

## 2016-08-02 ENCOUNTER — Ambulatory Visit
Admission: RE | Admit: 2016-08-02 | Discharge: 2016-08-02 | Disposition: A | Discharge status: Home / Self Care | Payer: BLUE CROSS/BLUE SHIELD | Source: Ambulatory Visit | Attending: Thoracic Surgery (Cardiothoracic Vascular Surgery) | Admitting: Thoracic Surgery (Cardiothoracic Vascular Surgery)

## 2016-08-02 ENCOUNTER — Ambulatory Visit (INDEPENDENT_AMBULATORY_CARE_PROVIDER_SITE_OTHER): Payer: Self-pay | Admitting: Thoracic Surgery (Cardiothoracic Vascular Surgery)

## 2016-08-02 ENCOUNTER — Encounter: Payer: Self-pay | Admitting: Thoracic Surgery (Cardiothoracic Vascular Surgery)

## 2016-08-02 ENCOUNTER — Encounter: Payer: Self-pay | Admitting: *Deleted

## 2016-08-02 VITALS — BP 129/72 | HR 90 | Resp 20 | Ht 71.0 in | Wt 185.0 lb

## 2016-08-02 DIAGNOSIS — Z79899 Other long term (current) drug therapy: Secondary | ICD-10-CM

## 2016-08-02 DIAGNOSIS — R05 Cough: Secondary | ICD-10-CM | POA: Diagnosis not present

## 2016-08-02 DIAGNOSIS — Z952 Presence of prosthetic heart valve: Secondary | ICD-10-CM

## 2016-08-02 DIAGNOSIS — I350 Nonrheumatic aortic (valve) stenosis: Secondary | ICD-10-CM

## 2016-08-02 DIAGNOSIS — Z953 Presence of xenogenic heart valve: Secondary | ICD-10-CM

## 2016-08-02 MED ORDER — FUROSEMIDE 40 MG PO TABS: 40.0000 mg | ORAL_TABLET | Freq: Every day | ORAL | 0 refills | Status: DC

## 2016-08-02 MED ORDER — POTASSIUM CHLORIDE ER 10 MEQ PO TBCR: 20.0000 meq | EXTENDED_RELEASE_TABLET | Freq: Every day | ORAL | 0 refills | Status: DC

## 2016-08-02 NOTE — Patient Instructions (Signed)
Resume taking lasix and potassium and monitor your weight.  Continue all other previous medications without any changes at this time.  You may return to driving an automobile as long as you are no longer requiring oral narcotic pain relievers during the daytime.  It would be wise to start driving only short distances during the daylight and gradually increase from there as you feel comfortable.  You may continue to gradually increase your physical activity as tolerated.  Refrain from any heavy lifting or strenuous use of your arms and shoulders until at least 8 weeks from the time of your surgery, and avoid activities that cause increased pain in your chest on the side of your surgical incision.  Otherwise you may continue to increase activities without any particular limitations.  Increase the intensity and duration of physical activity gradually.  You are encouraged to enroll and participate in the outpatient cardiac rehab program beginning as soon as practical.

## 2016-08-02 NOTE — Progress Notes (Signed)
[address]       [place] [address]             [phone]     CARDIOTHORACIC SURGERY OFFICE NOTE  Referring Provider is Runell Gess, MD PCP is Sid Falcon, MD   HPI:  Patient is a 61 year old male who returns to the office today for routine follow-up status post minimally invasive aortic valve replacement using a bioprosthetic tissue valve on 07/14/2016 for bicuspid aortic valve disease with critical aortic stenosis. The patient's immediate postoperative recovery was notable for bleeding for which he required reexploration to control bleeding from the right anterior chest wall.  The remainder of his postoperative recovery was essentially uncomplicated although he did require another trip to the operating room for chest tube removal to allow administration of intravenous sedation because of associated chest wall pain and anxiety. He otherwise progress quite smoothly and was ultimately discharged from the hospital on the fifth postoperative day. Since hospital discharge he has done well.  He was seen in our office last week is because of complaints of a low-grade temperature and persistent cough. His maximum temperature was reportedly 99.5. He reportedly looked quite good on physical exam and no significant changes were made. He returns for office today and reports that overall he continues to improve with exception of the fact that he still has a dry nonproductive cough. He states that he completed his course of oral Lasix over a week ago. Since then he gained 4 pounds in weight. He has not had fevers or chills. Appetite is good. He is walking every day and in fact walks at least a mile at a time without any problems. He has not had any dizzy spells. He has no pain in his chest. He is not sleeping fairly well although his cough is still bothersome.   Current Outpatient Prescriptions  Medication Sig Dispense Refill  . aspirin EC 81 MG tablet Take 81 mg by  mouth daily.    [name] atorvastatin (LIPITOR) 10 MG tablet Take 10 mg by mouth daily.    [name] EPINEPHrine (EPIPEN 2-PAK) 0.3 mg/0.3 mL IJ SOAJ injection Inject 0.3 mLs into the muscle as directed.    . fluticasone (FLONASE) 50 MCG/ACT nasal spray Place 1 spray into the nose as directed.    . furosemide (LASIX) 40 MG tablet Take 1 tablet (40 mg total) by mouth daily. 7 tablet 0  . meclizine (ANTIVERT) 25 MG tablet Take 25 mg by mouth as directed.    . metoprolol tartrate (LOPRESSOR) 25 MG tablet Take 0.5 tablets (12.5 mg total) by mouth 2 (two) times daily. 30 tablet 1  . potassium chloride (K-DUR) 10 MEQ tablet Take 2 tablets (20 mEq total) by mouth daily. 9 tablet 0  . traMADol (ULTRAM) 50 MG tablet Take 1 tablet (50 mg total) by mouth every 6 (six) hours as needed for moderate pain. 30 tablet 0   No current facility-administered medications for this visit.       Physical Exam:   BP 129/72   Pulse 90   Resp 20   Ht 5\' 11"  (1.803 m)   Wt 185 lb (83.9 kg)   SpO2 95% Comment: RA  BMI 25.80 kg/m   General:  Well-appearing  Chest:   Clear to auscultation with slightly diminished breath sounds right lung base  CV:   Regular rate and rhythm with systolic murmur heard best at left lower sternal border  Incisions:  Healing nicely  Abdomen:  Soft nontender  Extremities:  Warm and well-perfused  Diagnostic Tests:  CHEST  2 VIEW  COMPARISON:  07/28/2016 and CT chest 07/12/2016.  FINDINGS: Trachea is midline. Heart size stable. Aortic valve replacement. Small bilateral pleural effusions and bibasilar atelectasis. No edema. No pneumothorax. Malleable plate and screw fixation is seen along the right aspect of the sternum.  IMPRESSION: Small bilateral pleural effusions and bibasilar atelectasis.   Electronically Signed   By: Leanna BattlesMelinda  Blietz M.D.   On: 08/02/2016 12:38   Impression:  Patient is doing well just over 2 weeks status post minimally invasive aortic valve replacement  using a bioprosthetic tissue valve. He does report a persistent dry nonproductive cough in association with a 4 pound weight gain since he stopped taking Lasix. He otherwise looks remarkably good.  Chest x-ray reveals very small bilateral pleural effusions, right greater than left.  Plan:  I have reordered the patient's prescription for Lasix and potassium and instructed him to continue to monitor his weight. We have otherwise not recommended any changes to his current medications. I think he may resume driving an automobile. I've encouraged him to enroll and participate in outpatient cardiac rehabilitation program. We have discussed the timeline for his returning to work. All of his questions have been addressed.  The patient will return to our office for routine follow-up and repeat chest x-ray in 6 weeks. He will call and return sooner should specific problems or questions arise.    Salvatore Decentlarence H. Cornelius Moraswen, MD 08/02/2016 1:11 PM

## 2016-08-02 NOTE — Progress Notes (Signed)
A [facility] been [facility] to notify Mr. Jerry Odonnell to begin Phase II per the request of Dr. Cornelius Moraswen.

## 2016-08-03 ENCOUNTER — Telehealth: Payer: Self-pay | Admitting: Cardiovascular Disease

## 2016-08-03 ENCOUNTER — Other Ambulatory Visit: Payer: Self-pay | Admitting: *Deleted

## 2016-08-03 DIAGNOSIS — Z79899 Other long term (current) drug therapy: Secondary | ICD-10-CM

## 2016-08-03 MED ORDER — POTASSIUM CHLORIDE ER 10 MEQ PO TBCR: 20.0000 meq | EXTENDED_RELEASE_TABLET | Freq: Every day | ORAL | 0 refills | Status: DC

## 2016-08-03 NOTE — Telephone Encounter (Signed)
Received signed [employer]ability Forms back from Dr Allyson SabalBerry.  Notified patient forms completed and signed.  Faxed to [employer].  lp

## 2016-08-05 ENCOUNTER — Encounter: Payer: Self-pay | Admitting: Physician Assistant

## 2016-08-05 ENCOUNTER — Ambulatory Visit (INDEPENDENT_AMBULATORY_CARE_PROVIDER_SITE_OTHER): Payer: BLUE CROSS/BLUE SHIELD | Admitting: Physician Assistant

## 2016-08-05 VITALS — BP 117/69 | HR 81 | Ht 71.0 in | Wt 186.4 lb

## 2016-08-05 DIAGNOSIS — E8770 Fluid overload, unspecified: Secondary | ICD-10-CM | POA: Diagnosis not present

## 2016-08-05 DIAGNOSIS — Z79899 Other long term (current) drug therapy: Secondary | ICD-10-CM | POA: Diagnosis not present

## 2016-08-05 DIAGNOSIS — Z953 Presence of xenogenic heart valve: Secondary | ICD-10-CM | POA: Diagnosis not present

## 2016-08-05 LAB — CBC
HCT: 32.6 % — ABNORMAL LOW (ref 38.5–50.0)
Hemoglobin: 10.4 g/dL — ABNORMAL LOW (ref 13.2–17.1)
MCH: 27.9 pg (ref 27.0–33.0)
MCHC: 31.9 g/dL — ABNORMAL LOW (ref 32.0–36.0)
MCV: 87.4 fL (ref 80.0–100.0)
MPV: 10.3 fL (ref 7.5–12.5)
Platelets: 449 10*3/uL — ABNORMAL HIGH (ref 140–400)
RBC: 3.73 MIL/uL — ABNORMAL LOW (ref 4.20–5.80)
RDW: 15.1 % — AB (ref 11.0–15.0)
WBC: 9.5 10*3/uL (ref 3.8–10.8)

## 2016-08-05 LAB — BASIC METABOLIC PANEL
BUN: 19 mg/dL (ref 7–25)
CHLORIDE: 103 mmol/L (ref 98–110)
CO2: 28 mmol/L (ref 20–31)
Calcium: 9.2 mg/dL (ref 8.6–10.3)
Creat: 0.97 mg/dL (ref 0.70–1.25)
GLUCOSE: 103 mg/dL — AB (ref 65–99)
POTASSIUM: 5 mmol/L (ref 3.5–5.3)
Sodium: 139 mmol/L (ref 135–146)

## 2016-08-05 MED ORDER — POTASSIUM CHLORIDE ER 20 MEQ PO TBCR: 20.0000 meq | EXTENDED_RELEASE_TABLET | Freq: Every day | ORAL | 11 refills | Status: DC

## 2016-08-05 MED ORDER — FUROSEMIDE 40 MG PO TABS: 40.0000 mg | ORAL_TABLET | Freq: Every day | ORAL | 11 refills | Status: DC

## 2016-08-05 NOTE — Patient Instructions (Signed)
Medication Instructions:  TAKE LASIX 40MG  DAILY TAKE POTASSIUM 20 MEQ DAILY  If you need a refill on your cardiac medications before your next appointment, please call your pharmacy.  Labwork: CBC AND BMET TODAY AT [facility] ON THE 1ST FLOOR  Follow-Up: Your physician wants you to follow-up in: 1 MONTH WITH DR BERRY OR RHONDA BARRETT, PA-C    Special Instructions: CONTINUE DAILY PULSES AND WEIGHTS  MARIA WITH CARDIAC REHAB WILL CALL YOU TO SCHEDULE AN APPOINTMENT    Thank you for choosing [facility]!!    RHONDA BARRETT, PA-C Marcelino DusterMichelle, LPN

## 2016-08-05 NOTE — Progress Notes (Signed)
Cardiology Office Note   Date:  08/05/2016   ID:  Jerry Hazyirk Cooper Tep, DOB 1955/12/04, MRN [id number]  PCP:  Sid FalconKALISH, MICHAEL J, MD  Cardiologist:  Dr. William HamburgerBerry  Keslee Harrington, PA-C   Chief Complaint  Patient presents with  . Follow-up    Pt states no Sx.    History of Present Illness: Jerry Odonnell is a 61 y.o. male with a history of critical AS, s/p minimally invasive AVR with bioprosthetic tissue valve on 07/14/2016 with reexploration for bleeding. This was done for syncope. Hx TIA, HLD, thrombocytopenia, EF normal with moderate concentric LVH by echo  Jerry Odonnell presents for post-hospital follow up.  Since he went home, he has been Doing pretty well.  He saw Dr Cornelius Moraswen, is now able to drive and is living alone. He was started back on Lasix.  No dizziness, not light-headed. No LE edema, no orthopnea or PND. He feels his dry weight is 175-180. He was 181 lbs today. He lost 3 lbs since being started back on the Lasix. He had Lasix at first after d/c, but completed the course. He was off it for about a week, has now been restarted.   He does not check his BP at home. He wears a Fitbit so can check his HR. His HR was in the high 50s and 60s before the surgery. His resting HR is now in the 70s. It has been more elevated than previously since discharge, it is starting to trend down.   He is walking 2 miles daily or so, at 2.5 miles per hour. His HR goes to 110 with this, he does not feel overtired. He wants to get into cardiac rehab, has not been contacted by them yet. Before his surgery, he was doing 5-6 miles/day at 3-3.5 mph.    Past Medical History:  Diagnosis Date  . Aortic stenosis   . Hyperlipidemia   . Personal history of TIA (transient ischemic attack)    Admission in [facility] to [facility] with reported small lesion seen on imaging  . S/P minimally invasive aortic valve replacement with bioprosthetic valve 07/14/2016   23 mm Edwards Intuity Elite rapid deployment  bioprosthetic tissue valve via right mini thoracotomy approach  . Syncope and collapse 07/09/2016  . Thrombocytopenia (HCC)     Past Surgical History:  Procedure Laterality Date  . AORTIC VALVE REPLACEMENT N/A 07/14/2016   Procedure: MINIMALLY INVASIVE AORTIC VALVE REPLACEMENT (AVR);  Surgeon: Purcell Nailslarence H Owen, MD;  [facility];  Service: Open Heart Surgery;  Laterality: N/A;  . CHEST EXPLORATION Right 07/14/2016   Procedure: REEXPLORATION OF RIGHT THORACOTOMY FOR BLEEDING;  Surgeon: Purcell Nailslarence H Owen, MD;  Location: [facility] OR;  Service: Open Heart Surgery;  Laterality: Right;  . CHEST TUBE INSERTION N/A 07/16/2016   Procedure: CHEST TUBE REMOVAL, right chest;  Surgeon: Purcell Nailslarence H Owen, MD;  Location: [facility] OR;  Service: Thoracic;  Laterality: N/A;  . POSTERIOR CERVICAL LAMINECTOMY  2013  . RIGHT/LEFT HEART CATH AND CORONARY ANGIOGRAPHY N/A 07/09/2016   Procedure: Right/Left Heart Cath and Coronary Angiography;  Surgeon: Marykay Lexavid W Harding, MD;  [facility];  Service: Cardiovascular;  Laterality: N/A;  . TEE WITHOUT CARDIOVERSION N/A 07/14/2016   Procedure: TRANSESOPHAGEAL ECHOCARDIOGRAM (TEE);  Surgeon: Purcell Nailslarence H Owen, MD;  [facility];  Service: Open Heart Surgery;  Laterality: N/A;  . TEE WITHOUT CARDIOVERSION N/A 07/14/2016   Procedure: TRANSESOPHAGEAL ECHOCARDIOGRAM (TEE);  Surgeon: Purcell Nailslarence H Owen, MD;  [facility];  Service: Open Heart Surgery;  Laterality: N/A;    Current Outpatient Prescriptions  Medication Sig Dispense Refill  . aspirin EC 81 MG tablet Take 81 mg by mouth daily.    [name] atorvastatin (LIPITOR) 10 MG tablet Take 10 mg by mouth daily.    [name] EPINEPHrine (EPIPEN 2-PAK) 0.3 mg/0.3 mL IJ SOAJ injection Inject 0.3 mLs into the muscle as directed.    . fluticasone (FLONASE) 50 MCG/ACT nasal spray Place 1 spray into the nose as directed.    . furosemide (LASIX) 40 MG tablet Take 1 tablet (40 mg total) by mouth daily. 30 tablet 11  . meclizine (ANTIVERT) 25 MG  tablet Take 25 mg by mouth as directed.    . metoprolol tartrate (LOPRESSOR) 25 MG tablet Take 0.5 tablets (12.5 mg total) by mouth 2 (two) times daily. 30 tablet 1  . potassium chloride 20 MEQ TBCR Take 20 mEq by mouth daily. 30 tablet 11  . traMADol (ULTRAM) 50 MG tablet Take 1 tablet (50 mg total) by mouth every 6 (six) hours as needed for moderate pain. 30 tablet 0   No current facility-administered medications for this visit.     Allergies:   Bee venom    Social History:  The patient  reports that he has never smoked. He has never used smokeless tobacco. He reports that he drinks about 4.2 oz of alcohol per week . He reports that he does not use drugs.   Family History:  The patient's family history includes Healthy in his brother.    ROS:  Please see the history of present illness. All other systems are reviewed and negative.    PHYSICAL EXAM: VS:  BP 117/69   Pulse 81   Ht 5\' 11"  (1.803 m)   Wt 186 lb 6.4 oz (84.6 kg)   BMI 26.00 kg/m  , BMI Body mass index is 26 kg/m. GEN: Well nourished, well developed, male in no acute distress  HEENT: normal for age  Neck: 9 cm JVD,Radiation of murmur to both carotids, but no carotid bruit, no masses Cardiac: RRR; 2/6 murmur, strong S2, no rubs, or gallops Respiratory: decreased BS bases w/ some rales bilaterally, normal work of breathing GI: soft, nontender, nondistended, + BS MS: no deformity or atrophy; no edema; distal pulses are 2+ in all 4 extremities   Skin: warm and dry, no rash Neuro:  Strength and sensation are intact Psych: euthymic mood, full affect   EKG:  EKG is not ordered today.   Recent Labs: 07/09/2016: ALT 42; TSH 0.415 07/15/2016: Magnesium 2.0 07/17/2016: BUN 20; Creatinine, Ser 1.03; Potassium 4.2; Sodium 133 07/19/2016: Hemoglobin 8.4; Platelets 112    Lipid Panel No results found for: CHOL, TRIG, HDL, CHOLHDL, VLDL, LDLCALC, LDLDIRECT   Wt Readings from Last 3 Encounters:  08/05/16 186 lb 6.4 oz  (84.6 kg)  08/02/16 185 lb (83.9 kg)  07/28/16 185 lb (83.9 kg)     Other studies Reviewed: Additional studies/ records that were reviewed today include: Office notes, hospital records and testing.  ASSESSMENT AND PLAN:  1.  Severe aortic stenosis, s/p bioprosthetic AVR: He has followed up with [facility]. He is doing well from a surgical standpoint. Keep follow-up appointments with Dr. Cornelius Moras. We will check an echocardiogram again prior to his next office visit with Dr. Allyson Sabal. I contact cardiac rehabilitation and they will follow-up with him within a few days.  2. Volume overload: He has mild volume overload by exam. He was 181 pounds on his  scales today and thinks his dry weight is somewhere between 180 in the 175 pounds. She knew him on the Lasix and potassium at current doses. We will check a BMET today to follow his renal function and electrolytes. He is diet compliant, so he may not need Lasix at 40 mg daily long-term. We will follow him again in a month, and he is to call in the meantime if he has any symptoms of dehydration such as being lightheaded or dizzy.  3. Hypertension: His blood pressure is under good control today  4. Anemia and thrombocytopenia: He has had no bleeding issues since discharge. However, his hemoglobin was 8.4 when he left the hospital. We will recheck that today as well.   Current medicines are reviewed at length with the patient today.  The patient does not have concerns regarding medicines.  The following changes have been made:  Continue Lasix and potassium  Labs/ tests ordered today include:   Orders Placed This Encounter  Procedures  . Basic metabolic panel  . CBC     Disposition:   FU with Dr. Allyson Sabal or myself in a month  Signed, Jerry Odonnell  08/05/2016 3:20 PM     [facility] Phone: [phone]; Fax: [phone]  This note was written with the assistance of speech recognition software. Please excuse any  transcriptional errors.

## 2016-08-09 ENCOUNTER — Other Ambulatory Visit: Payer: Self-pay

## 2016-08-09 ENCOUNTER — Ambulatory Visit: Payer: BLUE CROSS/BLUE SHIELD | Admitting: Thoracic Surgery (Cardiothoracic Vascular Surgery)

## 2016-08-09 MED ORDER — METOPROLOL TARTRATE 25 MG PO TABS: 12.5000 mg | ORAL_TABLET | Freq: Two times a day (BID) | ORAL | 6 refills | Status: DC

## 2016-08-16 ENCOUNTER — Ambulatory Visit: Payer: BLUE CROSS/BLUE SHIELD | Admitting: Thoracic Surgery (Cardiothoracic Vascular Surgery)

## 2016-08-17 ENCOUNTER — Telehealth: Payer: Self-pay | Admitting: Physician Assistant

## 2016-08-17 NOTE — Telephone Encounter (Signed)
Spoke with pt states that his weight is up 3# in 2 days,sat 179# today 181.8#, he states that he has no LE or UE swelling but he states that his swelling, per Bjorn LoserRhonda, is in his abdomen-he states that he has not seen this swelling. Pt states that he is taking all medications as ordered, lasix 40mg  QD, metoprolol 12.5mg  BID, and potassium 20MeQ. Pt states that he does have a productive but does not know if this is due to the weather or or due to swelling but further states that he does notice this when weight is up. Pt states that his HR Sunday (per Fitbit) 65 Monday 67 and today 66. Pt states that he has no BP cuff to take his BP. Please advise

## 2016-08-17 NOTE — Telephone Encounter (Signed)
Continue to monitor weight and swelling. If this continues to increase he will need a appointment with a mid-level provider.

## 2016-08-17 NOTE — Telephone Encounter (Signed)
New message     Pt is calling about daily pulse and daily weight.   Pt states his weight is going up 3lbs in last 2 days ,no noticeable swelling anywhere

## 2016-08-18 ENCOUNTER — Encounter: Payer: Self-pay | Admitting: Cardiology

## 2016-08-18 ENCOUNTER — Ambulatory Visit (INDEPENDENT_AMBULATORY_CARE_PROVIDER_SITE_OTHER): Payer: BLUE CROSS/BLUE SHIELD | Admitting: Cardiology

## 2016-08-18 VITALS — BP 105/71 | HR 72 | Ht 71.0 in | Wt 183.0 lb

## 2016-08-18 DIAGNOSIS — Z953 Presence of xenogenic heart valve: Secondary | ICD-10-CM

## 2016-08-18 DIAGNOSIS — R05 Cough: Secondary | ICD-10-CM

## 2016-08-18 DIAGNOSIS — R0602 Shortness of breath: Secondary | ICD-10-CM

## 2016-08-18 DIAGNOSIS — R000 Tachycardia, unspecified: Secondary | ICD-10-CM

## 2016-08-18 DIAGNOSIS — R059 Cough, unspecified: Secondary | ICD-10-CM | POA: Insufficient documentation

## 2016-08-18 NOTE — Telephone Encounter (Signed)
We will look to create an open appt this week.

## 2016-08-18 NOTE — Assessment & Plan Note (Signed)
Pt complains of cough and wgt gain. He feels he has fluid in his chest. Added on my shcedule today for further evaluation. By my exam he does not have CHF.

## 2016-08-18 NOTE — Telephone Encounter (Signed)
Let patient know I discussed w Franky MachoLuke and Dr. Antoine PocheHochrein -- pt agreeable to come in today as schedule add-on @ 11:30a.

## 2016-08-18 NOTE — Patient Instructions (Signed)
Your physician recommends that you continue on your current medications as directed. Please refer to the Current Medication list given to you today..  Your physician recommends that you return for lab work in: today  BMET BNP TSH AND  FREE T4  Your physician has requested that you have an echocardiogram. Echocardiography is a painless test that uses sound waves to create images of your heart. It provides your doctor with information about the size and shape of your heart and how well your heart's chambers and valves are working. This procedure takes approximately one hour. There are no restrictions for this procedure.   Your physician recommends that you schedule a follow-up appointment in:  AS  SCHEDULED

## 2016-08-18 NOTE — Assessment & Plan Note (Signed)
S/P tissue AVR 07/14/16. He required re exploration later that day secondary to thoracotomy bleeding. He had post op chest tube till 07/16/16.

## 2016-08-18 NOTE — Telephone Encounter (Signed)
Spoke w Franky MachoLuke, [facility] unless double booking - Franky MachoLuke OK to add today if needed.  Spoke w pt, who indicated he was agreeable to instruction/management from home for now.  Will discuss w Dr. Antoine PocheHochrein.

## 2016-08-18 NOTE — Telephone Encounter (Signed)
Spoke to patient. Recommendations relayed, however, he was dissatisfied w advice. Notes it was apparent to him, Jerry Odonnell was very clear that his weight should be closely followed. Pt states he was trending down until recently, and that he is very sensitive to weight gains & can tell when this is happening. He wanted APP visit this week & is aware I have reviewed availability -- nothing on schedule until next week.  We discussed possibility of him taking extra lasix x2-3 days to see if symptoms improve. He's agreeable to this, but would like this instruction to be verified by a provider.  Pt taking 40mg  lasix daily AM, supplemental potassium. Last BMET stable.  Dr. Allyson SabalBerry out of office. Routed to [employer] to advise.

## 2016-08-18 NOTE — Progress Notes (Signed)
08/18/2016 Jerry Odonnell   02-26-1956  [id number]  Primary Physician Sid FalconKALISH, MICHAEL J, MD Primary Cardiologist: Dr Allyson SabalBerry  HPI:  61 y/o male with a history of bicuspid AOV and AS. He had a syncopal spell in Feb 2018 and was admitted for further evaluation. Cath confirmed critical AS. He had minor CAD and normal LVF. He underwent minimally invasive AVR 07/14/16 and tolerated this well. The pt tells me his pre op wgt was 190 lbs. Post op he felt like he was fluid overloaded- dry cough, increased wgt. He was placed on Lasix and felt better but he felt this had recurred and saw Rhonda on 08/05/16. She felt the pt was volume overloaded (wgt 186 lbs) and placed him on Lasix with instructions to call if his wgt increased. She set a goal wgt at 175 lbs. He called today asking to be seen secondary to wgt gain -183 lbs today. He says he has "fluid in my chest". He has a dry cough. His HR is up. He has no CHF on exam.    Current Outpatient Prescriptions  Medication Sig Dispense Refill  . aspirin EC 81 MG tablet Take 81 mg by mouth daily.    [name]. atorvastatin (LIPITOR) 10 MG tablet Take 10 mg by mouth daily.    [name]. EPINEPHrine (EPIPEN 2-PAK) 0.3 mg/0.3 mL IJ SOAJ injection Inject 0.3 mLs into the muscle as directed.    . fluticasone (FLONASE) 50 MCG/ACT nasal spray Place 1 spray into the nose as directed.    . furosemide (LASIX) 40 MG tablet Take 1 tablet (40 mg total) by mouth daily. 30 tablet 11  . meclizine (ANTIVERT) 25 MG tablet Take 25 mg by mouth as directed.    . metoprolol tartrate (LOPRESSOR) 25 MG tablet Take 0.5 tablets (12.5 mg total) by mouth 2 (two) times daily. 30 tablet 6  . potassium chloride 20 MEQ TBCR Take 20 mEq by mouth daily. 30 tablet 11  . traMADol (ULTRAM) 50 MG tablet Take 1 tablet (50 mg total) by mouth every 6 (six) hours as needed for moderate pain. 30 tablet 0   No current facility-administered medications for this visit.     Allergies  Allergen Reactions  . Bee Venom  Swelling    SWELLING REACTION UNSPECIFIED     Past Medical History:  Diagnosis Date  . Aortic stenosis   . Hyperlipidemia   . Personal history of TIA (transient ischemic attack)    [facility] with reported small lesion seen on imaging  . S/P minimally invasive aortic valve replacement with bioprosthetic valve 07/14/2016   23 mm Edwards Intuity Elite rapid deployment bioprosthetic tissue valve via right mini thoracotomy approach  . Syncope and collapse 07/09/2016  . Thrombocytopenia [facility](HCC)     Social History   Social History  . Marital status: Widowed    Spouse name: N/A  . Number of children: N/A  . Years of education: N/A   Occupational History  . Not on file.   Social History Main Topics  . Smoking status: Never Smoker  . Smokeless tobacco: Never Used  . Alcohol use 4.2 oz/week    7 Glasses of wine per week     Comment: approximately 1 drink per day  . Drug use: No  . Sexual activity: Not on file   Other Topics Concern  . Not on file   Social History Narrative  . No narrative on file     Family History  Problem  Relation Age of Onset  . Healthy Brother      Review of Systems: General: negative for chills, fever, night sweats or weight changes.  Cardiovascular: negative for chest pain, dyspnea on exertion, edema, orthopnea, palpitations, paroxysmal nocturnal dyspnea or shortness of breath Dermatological: negative for rash Respiratory: negative for cough or wheezing Urologic: negative for hematuria Abdominal: negative for nausea, vomiting, diarrhea, bright red blood per rectum, melena, or hematemesis Neurologic: negative for visual changes, syncope, or dizziness All other systems reviewed and are otherwise negative except as noted above.    Blood pressure 105/71, pulse 72, height 5\' 11"  (1.803 m), weight 183 lb (83 kg).  General appearance: alert, cooperative, no distress and anxious Neck: no carotid bruit and no JVD Lungs: clear to  auscultation bilaterally Heart: regular rate and rhythm and short systolic murmur LSB Abdomen: soft, non-tender; bowel sounds normal; no masses,  no organomegaly and no HJR noted Extremities: extremities normal, atraumatic, no cyanosis or edema Pulses: 2+ and symmetric Skin: Skin color, texture, turgor normal. No rashes or lesions Neurologic: Grossly normal    ASSESSMENT AND PLAN:   Cough Pt complains of cough and wgt gain. He feels he has fluid in his chest. Added on my shcedule today for further evaluation. By my exam he does not have CHF.  S/P minimally invasive aortic valve replacement with bioprosthetic valve S/P tissue AVR 07/14/16. He required re exploration later that day secondary to thoracotomy bleeding. He had post op chest tube till 07/16/16.    PLAN  Pt seen by Dr Antoine Poche and myself.  For now he can continue Lasix and I am also going to check an echo, BMP, BNP, and a TSH -and free T4, (this was low in Feb). F/U with Dr Allyson Sabal in 4-6 weeks or sooner pending these tests.   Corine Shelter PA-C  08/18/2016 12:24 PM

## 2016-08-19 LAB — BASIC METABOLIC PANEL
BUN: 22 mg/dL (ref 7–25)
CO2: 24 mmol/L (ref 20–31)
Calcium: 9.5 mg/dL (ref 8.6–10.3)
Chloride: 102 mmol/L (ref 98–110)
Creat: 0.9 mg/dL (ref 0.70–1.25)
Glucose, Bld: 88 mg/dL (ref 65–99)
Potassium: 5 mmol/L (ref 3.5–5.3)
Sodium: 138 mmol/L (ref 135–146)

## 2016-08-19 LAB — BRAIN NATRIURETIC PEPTIDE: Brain Natriuretic Peptide: 126.5 pg/mL — ABNORMAL HIGH (ref <100)

## 2016-08-19 LAB — TSH: TSH: 0.34 mIU/L — ABNORMAL LOW (ref 0.40–4.50)

## 2016-08-19 LAB — T4, FREE: Free T4: 1.2 ng/dL (ref 0.8–1.8)

## 2016-08-24 DIAGNOSIS — Z953 Presence of xenogenic heart valve: Secondary | ICD-10-CM | POA: Diagnosis not present

## 2016-08-25 DIAGNOSIS — Z953 Presence of xenogenic heart valve: Secondary | ICD-10-CM | POA: Diagnosis not present

## 2016-08-26 ENCOUNTER — Ambulatory Visit (HOSPITAL_COMMUNITY)
Admission: RE | Admit: 2016-08-26 | Discharge: 2016-08-26 | Disposition: A | Discharge status: Home / Self Care | Payer: BLUE CROSS/BLUE SHIELD | Source: Ambulatory Visit | Attending: Cardiology | Admitting: Cardiology

## 2016-08-26 DIAGNOSIS — R0602 Shortness of breath: Secondary | ICD-10-CM | POA: Diagnosis not present

## 2016-08-26 DIAGNOSIS — Z953 Presence of xenogenic heart valve: Secondary | ICD-10-CM | POA: Diagnosis not present

## 2016-08-26 DIAGNOSIS — I071 Rheumatic tricuspid insufficiency: Secondary | ICD-10-CM | POA: Diagnosis not present

## 2016-08-26 DIAGNOSIS — E785 Hyperlipidemia, unspecified: Secondary | ICD-10-CM | POA: Insufficient documentation

## 2016-08-26 DIAGNOSIS — R000 Tachycardia, unspecified: Secondary | ICD-10-CM | POA: Insufficient documentation

## 2016-08-26 NOTE — Progress Notes (Signed)
  Echocardiogram 2D Echocardiogram has been performed.  Jerry Odonnell 08/26/2016, 11:14 AM

## 2016-08-30 DIAGNOSIS — Z953 Presence of xenogenic heart valve: Secondary | ICD-10-CM | POA: Diagnosis not present

## 2016-09-01 DIAGNOSIS — Z953 Presence of xenogenic heart valve: Secondary | ICD-10-CM | POA: Diagnosis not present

## 2016-09-02 DIAGNOSIS — Z953 Presence of xenogenic heart valve: Secondary | ICD-10-CM | POA: Diagnosis not present

## 2016-09-03 ENCOUNTER — Other Ambulatory Visit: Payer: Self-pay | Admitting: Thoracic Surgery (Cardiothoracic Vascular Surgery)

## 2016-09-03 DIAGNOSIS — Z953 Presence of xenogenic heart valve: Secondary | ICD-10-CM

## 2016-09-06 ENCOUNTER — Telehealth (HOSPITAL_COMMUNITY): Payer: Self-pay | Admitting: Family Medicine

## 2016-09-06 ENCOUNTER — Ambulatory Visit
Admission: RE | Admit: 2016-09-06 | Discharge: 2016-09-06 | Disposition: A | Discharge status: Home / Self Care | Payer: BLUE CROSS/BLUE SHIELD | Source: Ambulatory Visit | Attending: Thoracic Surgery (Cardiothoracic Vascular Surgery) | Admitting: Thoracic Surgery (Cardiothoracic Vascular Surgery)

## 2016-09-06 ENCOUNTER — Encounter: Payer: Self-pay | Admitting: Thoracic Surgery (Cardiothoracic Vascular Surgery)

## 2016-09-06 ENCOUNTER — Ambulatory Visit (INDEPENDENT_AMBULATORY_CARE_PROVIDER_SITE_OTHER): Payer: BLUE CROSS/BLUE SHIELD | Admitting: Physician Assistant

## 2016-09-06 ENCOUNTER — Ambulatory Visit (INDEPENDENT_AMBULATORY_CARE_PROVIDER_SITE_OTHER): Payer: Self-pay | Admitting: Thoracic Surgery (Cardiothoracic Vascular Surgery)

## 2016-09-06 VITALS — BP 125/76 | HR 65 | Resp 20 | Ht 71.0 in | Wt 183.0 lb

## 2016-09-06 VITALS — BP 111/71 | HR 72 | Ht 71.0 in | Wt 190.2 lb

## 2016-09-06 DIAGNOSIS — Z79899 Other long term (current) drug therapy: Secondary | ICD-10-CM

## 2016-09-06 DIAGNOSIS — Z953 Presence of xenogenic heart valve: Secondary | ICD-10-CM

## 2016-09-06 DIAGNOSIS — J90 Pleural effusion, not elsewhere classified: Secondary | ICD-10-CM | POA: Diagnosis not present

## 2016-09-06 DIAGNOSIS — I350 Nonrheumatic aortic (valve) stenosis: Secondary | ICD-10-CM

## 2016-09-06 MED ORDER — POTASSIUM CHLORIDE ER 20 MEQ PO TBCR: 10.0000 meq | EXTENDED_RELEASE_TABLET | Freq: Every day | ORAL | 11 refills | Status: DC

## 2016-09-06 NOTE — Patient Instructions (Signed)

## 2016-09-06 NOTE — Progress Notes (Signed)
Cardiology Office Note   Date:  09/06/2016   ID:  Jerry Odonnell, DOB Mar 02, 1956, MRN [id number]  PCP:  Sid Falcon, MD  Cardiologist:  Dr. Allyson Sabal 07/07/2016 Jerry Demark, PA-C 08/05/2016  Chief Complaint  Patient presents with  . Follow-up    History of Present Illness: Jerry Odonnell is a 61 y.o. male with a history of minimally invasive AVR 07/14/16  Edwards Intuity Elite Rapid Deployment Pericardial Tissue Valve (size 23mm, model #8300AB, serial [id number]), re-exploration for bleeding; TIA, HLD, syncope prior to AVR, normal EF with moderate concentric LVH by echo  08/18/2016 office visit by Corine Shelter PA-C, patient complaining of cough and weight gain but not volume overloaded by exam  Jerry Odonnell presents for cardiology follow up.  His weight at cardiac rehab today was 185, 181 on his home scales. His weight range is 179-182. He weighs every morning. He is at cardiac rehab in [facility] could not work him in till May). Things are going well at rehab. He is increasing his activity. He is breathing better, definitely feels improved from a respiratory standpoint. He lost a couple of lbs, not much.   No chest pain. No palpitations, no orthopnea or PND. He wonders if he can wean himself off the Lasix, but wants to do it slowly. He wonders if he should stay on the BB or the statin.   Past Medical History:  Diagnosis Date  . Aortic stenosis   . Hyperlipidemia   . Personal history of TIA (transient ischemic attack)    Admission in 2017 to [facility] with reported small lesion seen on imaging  . S/P minimally invasive aortic valve replacement with bioprosthetic valve 07/14/2016   23 mm Edwards Intuity Elite rapid deployment bioprosthetic tissue valve via right mini thoracotomy approach  . Syncope and collapse 07/09/2016  . Thrombocytopenia (HCC)     Past Surgical History:  Procedure Laterality Date  . AORTIC VALVE REPLACEMENT N/A 07/14/2016   Procedure: MINIMALLY INVASIVE AORTIC VALVE REPLACEMENT (AVR);  Surgeon: Purcell Nails, MD;  Location: [facility] OR;  Service: Open Heart Surgery;  Laterality: N/A;  . CHEST EXPLORATION Right 07/14/2016   Procedure: REEXPLORATION OF RIGHT THORACOTOMY FOR BLEEDING;  Surgeon: Purcell Nails, MD;  Location: [facility] OR;  Service: Open Heart Surgery;  Laterality: Right;  . CHEST TUBE INSERTION N/A 07/16/2016   Procedure: CHEST TUBE REMOVAL, right chest;  Surgeon: Purcell Nails, MD;  Location: [facility] OR;  Service: Thoracic;  Laterality: N/A;  . POSTERIOR CERVICAL LAMINECTOMY  2013  . RIGHT/LEFT HEART CATH AND CORONARY ANGIOGRAPHY N/A 07/09/2016   Procedure: Right/Left Heart Cath and Coronary Angiography;  Surgeon: Marykay Lex, MD;  Location: [facility] INVASIVE CV LAB;  Service: Cardiovascular;  Laterality: N/A;  . TEE WITHOUT CARDIOVERSION N/A 07/14/2016   Procedure: TRANSESOPHAGEAL ECHOCARDIOGRAM (TEE);  Surgeon: Purcell Nails, MD;  Location: [facility] OR;  Service: Open Heart Surgery;  Laterality: N/A;  . TEE WITHOUT CARDIOVERSION N/A 07/14/2016   Procedure: TRANSESOPHAGEAL ECHOCARDIOGRAM (TEE);  Surgeon: Purcell Nails, MD;  Location: [facility] OR;  Service: Open Heart Surgery;  Laterality: N/A;    Current Outpatient Prescriptions  Medication Sig Dispense Refill  . aspirin EC 81 MG tablet Take 81 mg by mouth daily.    [name] atorvastatin (LIPITOR) 10 MG tablet Take 10 mg by mouth daily.    [name] EPINEPHrine (EPIPEN 2-PAK) 0.3 mg/0.3 mL IJ SOAJ injection Inject 0.3 mLs into the muscle as directed.    [name]  fluticasone (FLONASE) 50 MCG/ACT nasal spray Place 1 spray into the nose as directed.    . furosemide (LASIX) 40 MG tablet Take 1 tablet (40 mg total) by mouth daily. 30 tablet 11  . meclizine (ANTIVERT) 25 MG tablet Take 25 mg by mouth as directed.    . metoprolol tartrate (LOPRESSOR) 25 MG tablet Take 0.5 tablets (12.5 mg total) by mouth 2 (two) times daily. 30 tablet 6  . potassium chloride 20 MEQ TBCR Take 20 mEq by mouth daily. 30  tablet 11   No current facility-administered medications for this visit.     Allergies:   Bee venom    Social History:  The patient  reports that he has never smoked. He has never used smokeless tobacco. He reports that he drinks about 4.2 oz of alcohol per week . He reports that he does not use drugs.   Family History:  The patient's family history includes Healthy in his brother.    ROS:  Please see the history of present illness. All other systems are reviewed and negative.    PHYSICAL EXAM: VS:  BP 111/71 (BP Location: Left Arm)   Pulse 72   Ht  (1.803 m)   Wt 190 lb 3.2 oz (86.3 kg)   BMI 26.53 kg/m  , BMI Body mass index is 26.53 kg/m. GEN: Well nourished, well developed, male in no acute distress  HEENT: normal for age  Neck: no JVD, no carotid bruit, no masses Cardiac: RRR; 2/6 murmur, no rubs, or gallops Respiratory:  clear to auscultation bilaterally, normal work of breathing GI: soft, nontender, nondistended, + BS MS: no deformity or atrophy; no edema; distal pulses are 2+ in all 4 extremities   Skin: warm and dry, no rash Neuro:  Strength and sensation are intact Psych: euthymic mood, full affect   EKG:  EKG is not ordered today.  Echo: 08/26/2016 - Left ventricle: The cavity size was normal. There was mild   concentric hypertrophy. Systolic function was normal. The   estimated ejection fraction was in the range of 60% to 65%. Wall   motion was normal; there were no regional wall motion   abnormalities. Features are consistent with a pseudonormal left   ventricular filling pattern, with concomitant abnormal relaxation   and increased filling pressure (grade 2 diastolic dysfunction). - Ventricular septum: Septal motion showed paradox. These changes   are consistent with a post-thoracotomy state. - Aortic valve: A bioprosthesis was present and functioning   normally. Valve area (VTI): 1.08 cm^2. Valve area (Vmax): 1.08   cm^2. Valve area (Vmean): 0.97  cm^2. Mean gradient (S): 10 mm Hg.    Peak gradient (S): 21 mm Hg. - Atrial septum: No defect or patent foramen ovale was identified. - Pulmonary artery:   Systolic pressure was within the normal range   Recent Labs: 07/09/2016: ALT 42 07/15/2016: Magnesium 2.0 08/05/2016: Hemoglobin 10.4; Platelets 449 08/18/2016: Brain Natriuretic Peptide 126.5; BUN 22; Creat 0.90; Potassium 5.0; Sodium 138; TSH 0.34    Lipid Panel No results found for: CHOL, TRIG, HDL, CHOLHDL, VLDL, LDLCALC, LDLDIRECT   Wt Readings from Last 3 Encounters:  09/06/16 190 lb 3.2 oz (86.3 kg)  09/06/16 183 lb (83 kg)  08/18/16 183 lb (83 kg)     Other studies Reviewed: Additional studies/ records that were reviewed today include: Office notes, hospital records and testing.  ASSESSMENT AND PLAN:  1.  s/p AVR: his gradient was not elevated on a recent echo. He is  doing extremely well at cardiac rehab. Advised he should continue the BB and the statin. OK to wean the Lasix and d/c it if BP and volume status are good.  2. Borderline HTN: Pt aware his BP goal is >130/80 (and LDL goal is < 70). OK to wean the Lasix. His last 2 K+ measurements have been 5.0, will recheck today. Pt understands that he needs to decrease the K+ as he decreases the Lasix. He will go ahead and cut the Kdur to 10 meq qd now.   Current medicines are reviewed at length with the patient today.  The patient does not have concerns regarding medicines.  The following changes have been made:  no change  Labs/ tests ordered today include:  No orders of the defined types were placed in this encounter.    Disposition:   FU with Dr Allyson Sabal  Signed, Leanna Battles  09/06/2016 2:33 PM    [facility] Phone: [phone]; Fax: [phone]  This note was written with the assistance of speech recognition software. Please excuse any transcriptional errors.

## 2016-09-06 NOTE — Progress Notes (Signed)
[address]       [place] [address]             [phone]     CARDIOTHORACIC SURGERY OFFICE NOTE  Referring Provider is Runell Gess, MD PCP is Sid Falcon, MD   HPI:  Patient returns to the office today for routine follow-up status post minimally invasive aortic valve replacement using a bioprosthetic tissue valve on 07/14/2016 for bicuspid aortic valve disease with critical aortic stenosis. He was last seen here in our office on 08/02/2016 at which time he was doing fairly well.  However at that time he was complaining of a nonproductive cough and had noticed 4 pound weight gain. Chest x-ray revealed small bilateral pleural effusions. Lasix was restarted at that time and the patient improved very quickly.  Since then the patient has been seen in follow-up on 2 occasions at [facility].  Routine follow-up echocardiogram as performed 08/26/2016 demonstrated normal left ventricular systolic function with ejection fraction estimated 60-65% with aortic valve prosthesis was functioning normally. Mean transvalvular gradient across the aortic valve was estimated 10 mmHg. There is no paravalvular leak. The patient returns to our office today for routine follow-up. He reports that he is doing exceptionally well. He has been participating in the outpatient cardiac rehabilitation program in [facility] because she could not get an appointment to start cardiac rehabilitation in [place] until late in May. He has been enjoying the cardiac rehabilitation program in [facility] and making excellent progress. He reports no significant shortness of breath and already appreciate a difference in his exercise tolerance and capacity in comparison with prior to surgery. He has no pain in his chest. Overall he feels well and is delighted with his progress.    Current Outpatient Prescriptions  Medication Sig Dispense Refill  . aspirin EC 81 MG tablet Take 81 mg by mouth daily.     [name] atorvastatin (LIPITOR) 10 MG tablet Take 10 mg by mouth daily.    [name] EPINEPHrine (EPIPEN 2-PAK) 0.3 mg/0.3 mL IJ SOAJ injection Inject 0.3 mLs into the muscle as directed.    . fluticasone (FLONASE) 50 MCG/ACT nasal spray Place 1 spray into the nose as directed.    . furosemide (LASIX) 40 MG tablet Take 1 tablet (40 mg total) by mouth daily. 30 tablet 11  . meclizine (ANTIVERT) 25 MG tablet Take 25 mg by mouth as directed.    . metoprolol tartrate (LOPRESSOR) 25 MG tablet Take 0.5 tablets (12.5 mg total) by mouth 2 (two) times daily. 30 tablet 6  . potassium chloride 20 MEQ TBCR Take 20 mEq by mouth daily. 30 tablet 11   No current facility-administered medications for this visit.       Physical Exam:   BP 125/76   Pulse 65   Resp 20   Ht 5\' 11"  (1.803 m)   Wt 183 lb (83 kg)   SpO2 99%   BMI 25.52 kg/m   General:  Well-appearing  Chest:   Clear to auscultation  CV:   Regular rate and rhythm without murmur  Incisions:  Healing nicely  Abdomen:  Soft nontender  Extremities:  Warm and well-perfused  Diagnostic Tests:  Transthoracic Echocardiography  Patient:    Zubayr, Bednarczyk MR #:       [id number] Study Date: 08/26/2016 Gender:     M Age:        61 Height:     180.3 cm Weight:  83 kg BSA:        2.05 m^2 Pt. Status: Room:   ATTENDING    Jessica Priest  REFERRING    Corine Shelter K  PERFORMING   [facility], Outpatient  SONOGRAPHER  Thurman Coyer  cc:  ------------------------------------------------------------------- LV EF: 60% -   65%  ------------------------------------------------------------------- Indications:      Dyspnea 786.09.  ------------------------------------------------------------------- History:   Risk factors:  Dyslipidemia.  ------------------------------------------------------------------- Study Conclusions  - Left ventricle: The cavity size was normal. There was mild   concentric  hypertrophy. Systolic function was normal. The   estimated ejection fraction was in the range of 60% to 65%. Wall   motion was normal; there were no regional wall motion   abnormalities. Features are consistent with a pseudonormal left   ventricular filling pattern, with concomitant abnormal relaxation   and increased filling pressure (grade 2 diastolic dysfunction). - Ventricular septum: Septal motion showed paradox. These changes   are consistent with a post-thoracotomy state. - Aortic valve: A bioprosthesis was present and functioning   normally. Valve area (VTI): 1.08 cm^2. Valve area (Vmax): 1.08   cm^2. Valve area (Vmean): 0.97 cm^2. - Atrial septum: No defect or patent foramen ovale was identified.  ------------------------------------------------------------------- Labs, prior tests, procedures, and surgery: Valve surgery (07/14/2016).     Aortic valve replacement with a bioprosthetic valve.  ------------------------------------------------------------------- Study data:  Comparison was made to the study of 07/12/2016.  Study status:  Routine.  Procedure:  The patient reported no pain pre or post test. Transthoracic echocardiography. Image quality was adequate.  Study completion:  There were no complications. Transthoracic echocardiography.  M-mode, complete 2D, spectral Doppler, and color Doppler.  Birthdate:  Patient birthdate: [birth year].  Age:  Patient is 61 yr old.  Sex:  Gender: male. BMI: 25.5 kg/m^2.  Blood pressure:     114/67  Patient status: Outpatient.  Study date:  Study date: 08/26/2016. Study time: 10:07 AM.  Location:  Echo laboratory.  -------------------------------------------------------------------  ------------------------------------------------------------------- Left ventricle:  The cavity size was normal. There was mild concentric hypertrophy. Systolic function was normal. The estimated ejection fraction was in the range of 60% to 65%. Wall  motion was normal; there were no regional wall motion abnormalities. Features are consistent with a pseudonormal left ventricular filling pattern, with concomitant abnormal relaxation and increased filling pressure (grade 2 diastolic dysfunction).  ------------------------------------------------------------------- Aortic valve:  A bioprosthesis was present and functioning normally.  Doppler:  There was no regurgitation.    VTI ratio of LVOT to aortic valve: 0.48. Valve area (VTI): 1.08 cm^2. Indexed valve area (VTI): 0.53 cm^2/m^2. Peak velocity ratio of LVOT to aortic valve: 0.47. Valve area (Vmax): 1.08 cm^2. Indexed valve area (Vmax): 0.53 cm^2/m^2. Mean velocity ratio of LVOT to aortic valve: 0.43. Valve area (Vmean): 0.97 cm^2. Indexed valve area (Vmean): 0.47 cm^2/m^2.    Mean gradient (S): 10 mm Hg. Peak gradient (S): 21 mm Hg.  ------------------------------------------------------------------- Aorta:  The aorta was poorly visualized. Aortic root: The aortic root was normal in size. Ascending aorta: The ascending aorta was normal in size.  ------------------------------------------------------------------- Mitral valve:   Structurally normal valve.   Leaflet separation was normal.  Doppler:  Transvalvular velocity was within the normal range. There was no evidence for stenosis. There was no regurgitation.    Peak gradient (D): 4 mm Hg.  ------------------------------------------------------------------- Left atrium:  The atrium was at the upper limits of normal in size.   ------------------------------------------------------------------- Atrial  septum:  No defect or patent foramen ovale was identified.   ------------------------------------------------------------------- Right ventricle:  The cavity size was normal. Systolic function was normal.  ------------------------------------------------------------------- Ventricular septum:   Septal motion showed  paradox. These changes are consistent with a post-thoracotomy state.  ------------------------------------------------------------------- Pulmonic valve:   Poorly visualized.  ------------------------------------------------------------------- Tricuspid valve:   Structurally normal valve.   Leaflet separation was normal.  Doppler:  Transvalvular velocity was within the normal range. There was trivial regurgitation.  ------------------------------------------------------------------- Pulmonary artery:   Systolic pressure was within the normal range.   ------------------------------------------------------------------- Right atrium:  The atrium was normal in size.  ------------------------------------------------------------------- Pericardium:  There was no pericardial effusion.  ------------------------------------------------------------------- Systemic veins: Inferior vena cava: The vessel was normal in size. The respirophasic diameter changes were in the normal range (>= 50%), consistent with normal central venous pressure.  ------------------------------------------------------------------- Post procedure conclusions Ascending Aorta:  - The aorta was poorly visualized.  ------------------------------------------------------------------- Measurements   Left ventricle                           Value          Reference  LV ID, ED, PLAX chordal                  49.5  mm       43 - 52  LV ID, ES, PLAX chordal                  34.6  mm       23 - 38  LV fx shortening, PLAX chordal           30    %        >=29  LV PW thickness, ED                      8.46  mm       ----------  IVS/LV PW ratio, ED              (H)     1.44           <=1.3  Stroke volume, 2D                        53    ml       ----------  Stroke volume/bsa, 2D                    26    ml/m^2   ----------  LV ejection fraction, 1-p A4C            57    %        ----------  LV end-diastolic volume,  2-p             111   ml       ----------  LV end-systolic volume, 2-p              43    ml       ----------  LV ejection fraction, 2-p                61    %        ----------  Stroke volume, 2-p                       68    ml       ----------  LV end-diastolic volume/bsa, 2-p         54    ml/m^2   ----------  LV end-systolic volume/bsa, 2-p          21    ml/m^2   ----------  Stroke volume/bsa, 2-p                   33.2  ml/m^2   ----------  LV e&', lateral                           7.94  cm/s     ----------  LV E/e&', lateral                         13.1           ----------  LV e&', medial                            5.55  cm/s     ----------  LV E/e&', medial                          18.74          ----------  LV e&', average                           6.75  cm/s     ----------  LV E/e&', average                         15.42          ----------    Ventricular septum                       Value          Reference  IVS thickness, ED                        12.2  mm       ----------    LVOT                                     Value          Reference  LVOT ID, S                               17    mm       ----------  LVOT area                                2.27  cm^2     ----------  LVOT peak velocity, S                    108   cm/s     ----------  LVOT mean velocity, S                    61.3  cm/s     ----------  LVOT VTI, S  23.2  cm       ----------    Aortic valve                             Value          Reference  Aortic valve peak velocity, S            228   cm/s     ----------  Aortic valve mean velocity, S            144   cm/s     ----------  Aortic valve VTI, S                      48.8  cm       ----------  Aortic mean gradient, S                  10    mm Hg    ----------  Aortic peak gradient, S                  21    mm Hg    ----------  VTI ratio, LVOT/AV                       0.48           ----------  Aortic valve area, VTI                    1.08  cm^2     ----------  Aortic valve area/bsa, VTI               0.53  cm^2/m^2 ----------  Velocity ratio, peak, LVOT/AV            0.47           ----------  Aortic valve area, peak velocity         1.08  cm^2     ----------  Aortic valve area/bsa, peak              0.53  cm^2/m^2 ----------  velocity  Velocity ratio, mean, LVOT/AV            0.43           ----------  Aortic valve area, mean velocity         0.97  cm^2     ----------  Aortic valve area/bsa, mean              0.47  cm^2/m^2 ----------  velocity    Aorta                                    Value          Reference  Aortic root ID, ED                       29    mm       ----------    Left atrium                              Value          Reference  LA ID, A-P, ES  37    mm       ----------  LA ID/bsa, A-P                           1.81  cm/m^2   <=2.2  LA volume, S                             56.8  ml       ----------  LA volume/bsa, S                         27.7  ml/m^2   ----------  LA volume, ES, 1-p A4C                   56.9  ml       ----------  LA volume/bsa, ES, 1-p A4C               27.8  ml/m^2   ----------  LA volume, ES, 1-p A2C                   53.2  ml       ----------  LA volume/bsa, ES, 1-p A2C               26    ml/m^2   ----------    Mitral valve                             Value          Reference  Mitral E-wave peak velocity              104   cm/s     ----------  Mitral A-wave peak velocity              92    cm/s     ----------  Mitral deceleration time         (H)     313   ms       150 - 230  Mitral peak gradient, D                  4     mm Hg    ----------  Mitral E/A ratio, peak                   1.1            ----------    Pulmonary arteries                       Value          Reference  PA pressure, S, DP                       26    mm Hg    <=30    Tricuspid valve                          Value          Reference  Tricuspid regurg peak  velocity           239   cm/s     ----------  Tricuspid peak RV-RA gradient  23    mm Hg    ----------    Right atrium                             Value          Reference  RA ID, S-I, ES, A4C              (H)     62.5  mm       34 - 49  RA area, ES, A4C                         18.3  cm^2     8.3 - 19.5  RA volume, ES, A/L                       43.7  ml       ----------  RA volume/bsa, ES, A/L                   21.3  ml/m^2   ----------    Systemic veins                           Value          Reference  Estimated CVP                            3     mm Hg    ----------    Right ventricle                          Value          Reference  TAPSE                                    16.3  mm       ----------  RV pressure, S, DP                       26    mm Hg    <=30  RV s&', lateral, S                        10    cm/s     ----------  Legend: (L)  and  (H)  mark values outside specified reference range.  ------------------------------------------------------------------- Prepared and Electronically Authenticated by  Thurmon Fair, MD 2018-04-05T15:30:01   CHEST  2 VIEW  COMPARISON:  Chest x-ray 08/02/2016, CT 07/12/2016  FINDINGS: Cardiomediastinal silhouette is unchanged. Prosthetic aortic valve projects over the mediastinum, unchanged from prior.  Surgical changes of mini thoracotomy.  Surgical clips in the right hilum.  No confluent airspace disease. Since the prior plain film the right-sided pleural effusion and left-sided pleural effusion have resolved with improved aeration.  No pneumothorax.  No displaced fracture.  IMPRESSION: Interval resolution of pleural fluid, with improved aeration on the current plain film and no evidence of acute cardiopulmonary disease.  Re- demonstration of surgical changes of mini thoracotomy and minimally invasive aortic valve repair.   Electronically Signed   By: Gilmer Mor D.O.   On: 09/06/2016  09:31   Impression:  Patient is  doing very well less than 2 months status post minimally invasive aortic valve replacement. He reports excellent exercise tolerance, no shortness of breath, and no residual pain. Postoperative echo looks good with normal left ventricular systolic function normal valve function with low transvalvular gradient. Repeat chest x-ray revealed complete resolution of the previous pleural effusions.  Plan:  I have encouraged the patient to continue to gradually increase his physical activity as tolerated without any particular limitations at this time. We have not recommended any changes to his current medications.  The patient has been reminded regarding the importance of dental hygiene and the lifelong need for antibiotic prophylaxis for all dental cleanings and other related invasive procedures.  The patient will return to our office next February, approximately 1 year following his surgery for routine follow-up. He will continue to follow-up Dr. Allyson Sabal.  All of his questions have been addressed.    Salvatore Decent. Cornelius Moras, MD 09/06/2016 10:42 AM

## 2016-09-06 NOTE — Patient Instructions (Signed)
Medication Instructions:  DECREASE POTASSIUM TO 10 MeQ(1/2 TABLET ) DAILY  If you need a refill on your cardiac medications before your next appointment, please call your pharmacy.  Labwork: HAVE PCP DO LIPID PANEL  Follow-Up: Your physician wants you to follow-up in: October 2018 WITH DR San Morelle will receive a reminder letter in the mail two months in advance. If you don't receive a letter, please call our office AUGUST 2018 to schedule the OCTOBER follow-up appointment.   Special Instructions: TARGET BP IS < 130/80  OK TO TAPER LASIX AND POTASSIUM  CONTINUE DAILY WEIGHTS    Thank you for choosing [facility] at [facility]!!    RHONDA BARRETT, PA-C Marcelino Duster, LPN

## 2016-09-08 DIAGNOSIS — Z953 Presence of xenogenic heart valve: Secondary | ICD-10-CM | POA: Diagnosis not present

## 2016-09-09 DIAGNOSIS — Z953 Presence of xenogenic heart valve: Secondary | ICD-10-CM | POA: Diagnosis not present

## 2016-09-11 ENCOUNTER — Other Ambulatory Visit: Payer: Self-pay | Admitting: Physician Assistant

## 2016-09-11 DIAGNOSIS — R946 Abnormal results of thyroid function studies: Secondary | ICD-10-CM | POA: Diagnosis not present

## 2016-09-11 DIAGNOSIS — E785 Hyperlipidemia, unspecified: Secondary | ICD-10-CM | POA: Diagnosis not present

## 2016-09-13 ENCOUNTER — Ambulatory Visit: Payer: BLUE CROSS/BLUE SHIELD | Admitting: Thoracic Surgery (Cardiothoracic Vascular Surgery)

## 2016-09-13 ENCOUNTER — Other Ambulatory Visit: Payer: Self-pay | Admitting: Cardiovascular Disease

## 2016-09-20 DIAGNOSIS — R946 Abnormal results of thyroid function studies: Secondary | ICD-10-CM | POA: Diagnosis not present

## 2016-09-20 DIAGNOSIS — Z953 Presence of xenogenic heart valve: Secondary | ICD-10-CM | POA: Diagnosis not present

## 2016-09-20 DIAGNOSIS — E785 Hyperlipidemia, unspecified: Secondary | ICD-10-CM | POA: Diagnosis not present

## 2016-09-20 DIAGNOSIS — D696 Thrombocytopenia, unspecified: Secondary | ICD-10-CM | POA: Diagnosis not present

## 2016-09-20 DIAGNOSIS — R030 Elevated blood-pressure reading, without diagnosis of hypertension: Secondary | ICD-10-CM | POA: Diagnosis not present

## 2016-09-21 ENCOUNTER — Ambulatory Visit (HOSPITAL_COMMUNITY): Payer: BLUE CROSS/BLUE SHIELD

## 2016-09-22 DIAGNOSIS — Z953 Presence of xenogenic heart valve: Secondary | ICD-10-CM | POA: Diagnosis not present

## 2016-09-22 DIAGNOSIS — D696 Thrombocytopenia, unspecified: Secondary | ICD-10-CM | POA: Diagnosis not present

## 2016-09-22 DIAGNOSIS — R030 Elevated blood-pressure reading, without diagnosis of hypertension: Secondary | ICD-10-CM | POA: Diagnosis not present

## 2016-09-23 DIAGNOSIS — Z953 Presence of xenogenic heart valve: Secondary | ICD-10-CM | POA: Diagnosis not present

## 2016-09-27 ENCOUNTER — Ambulatory Visit (HOSPITAL_COMMUNITY): Payer: BLUE CROSS/BLUE SHIELD

## 2016-09-27 DIAGNOSIS — Z953 Presence of xenogenic heart valve: Secondary | ICD-10-CM | POA: Diagnosis not present

## 2016-09-29 ENCOUNTER — Ambulatory Visit (HOSPITAL_COMMUNITY): Payer: BLUE CROSS/BLUE SHIELD

## 2016-09-29 DIAGNOSIS — Z953 Presence of xenogenic heart valve: Secondary | ICD-10-CM | POA: Diagnosis not present

## 2016-09-30 DIAGNOSIS — Z953 Presence of xenogenic heart valve: Secondary | ICD-10-CM | POA: Diagnosis not present

## 2016-10-01 ENCOUNTER — Ambulatory Visit (HOSPITAL_COMMUNITY): Payer: BLUE CROSS/BLUE SHIELD

## 2016-10-04 ENCOUNTER — Ambulatory Visit (HOSPITAL_COMMUNITY): Payer: BLUE CROSS/BLUE SHIELD

## 2016-10-04 DIAGNOSIS — Z953 Presence of xenogenic heart valve: Secondary | ICD-10-CM | POA: Diagnosis not present

## 2016-10-06 ENCOUNTER — Ambulatory Visit (HOSPITAL_COMMUNITY): Payer: BLUE CROSS/BLUE SHIELD

## 2016-10-07 DIAGNOSIS — Z953 Presence of xenogenic heart valve: Secondary | ICD-10-CM | POA: Diagnosis not present

## 2016-10-08 ENCOUNTER — Ambulatory Visit (HOSPITAL_COMMUNITY): Payer: BLUE CROSS/BLUE SHIELD

## 2016-10-08 ENCOUNTER — Telehealth: Payer: Self-pay | Admitting: Cardiovascular Disease

## 2016-10-08 MED ORDER — AMOXICILLIN 500 MG PO CAPS: 2000.0000 mg | ORAL_CAPSULE | Freq: Once | ORAL | 2 refills | Status: AC

## 2016-10-08 NOTE — Telephone Encounter (Signed)
Patient will need amoxicillin 2 gm to be taken 1 hour prior to dental cleaning.  Give 500 mg capsules #4 with 2 refills

## 2016-10-08 NOTE — Telephone Encounter (Signed)
Pt says he need antibotics for his dental work please.

## 2016-10-08 NOTE — Telephone Encounter (Signed)
Dr Jerry Odonnell pt with a history of minimally invasive AVR 07/14/16 Edwards Intuity Elite Rapid DeploymentPericardial Tissue Valve (size 23mm, model#8300AB, serial [id number])  Spoke with pt he states that he is have dental cleaning done on 10-20-16 and states that he called PCP for rx for antibiotic and they will not provide and neither will the dentist. Can you write rx for pt's dental cleaning? Please advise

## 2016-10-08 NOTE — Telephone Encounter (Signed)
Also cleaning is being done by Dr Rocco PaulsJerry Reeves [phone] on 10-20-16

## 2016-10-08 NOTE — Telephone Encounter (Signed)
atb order entered-[facility] pharmacist will add 1 hour prior to dental cleaning to sig  Pt notified

## 2016-10-11 ENCOUNTER — Ambulatory Visit (HOSPITAL_COMMUNITY): Payer: BLUE CROSS/BLUE SHIELD

## 2016-10-11 DIAGNOSIS — Z953 Presence of xenogenic heart valve: Secondary | ICD-10-CM | POA: Diagnosis not present

## 2016-10-13 ENCOUNTER — Ambulatory Visit (HOSPITAL_COMMUNITY): Payer: BLUE CROSS/BLUE SHIELD

## 2016-10-13 DIAGNOSIS — Z953 Presence of xenogenic heart valve: Secondary | ICD-10-CM | POA: Diagnosis not present

## 2016-10-14 DIAGNOSIS — Z953 Presence of xenogenic heart valve: Secondary | ICD-10-CM | POA: Diagnosis not present

## 2016-10-15 ENCOUNTER — Ambulatory Visit (HOSPITAL_COMMUNITY): Payer: BLUE CROSS/BLUE SHIELD

## 2016-10-20 ENCOUNTER — Ambulatory Visit (HOSPITAL_COMMUNITY): Payer: BLUE CROSS/BLUE SHIELD

## 2016-10-20 DIAGNOSIS — Z953 Presence of xenogenic heart valve: Secondary | ICD-10-CM | POA: Diagnosis not present

## 2016-10-21 DIAGNOSIS — Z953 Presence of xenogenic heart valve: Secondary | ICD-10-CM | POA: Diagnosis not present

## 2016-10-22 ENCOUNTER — Ambulatory Visit (HOSPITAL_COMMUNITY): Payer: BLUE CROSS/BLUE SHIELD

## 2016-10-25 ENCOUNTER — Ambulatory Visit (HOSPITAL_COMMUNITY): Payer: BLUE CROSS/BLUE SHIELD

## 2016-10-25 DIAGNOSIS — Z953 Presence of xenogenic heart valve: Secondary | ICD-10-CM | POA: Diagnosis not present

## 2016-10-25 DIAGNOSIS — Z48812 Encounter for surgical aftercare following surgery on the circulatory system: Secondary | ICD-10-CM | POA: Diagnosis not present

## 2016-10-25 NOTE — Addendum Note (Signed)
Addendum  created 10/25/16 1131 by Fremon Zacharia, MD   Sign clinical note    

## 2016-10-27 ENCOUNTER — Ambulatory Visit (HOSPITAL_COMMUNITY): Payer: BLUE CROSS/BLUE SHIELD

## 2016-10-27 DIAGNOSIS — Z48812 Encounter for surgical aftercare following surgery on the circulatory system: Secondary | ICD-10-CM | POA: Diagnosis not present

## 2016-10-27 DIAGNOSIS — Z953 Presence of xenogenic heart valve: Secondary | ICD-10-CM | POA: Diagnosis not present

## 2016-10-28 DIAGNOSIS — Z953 Presence of xenogenic heart valve: Secondary | ICD-10-CM | POA: Diagnosis not present

## 2016-10-28 DIAGNOSIS — Z48812 Encounter for surgical aftercare following surgery on the circulatory system: Secondary | ICD-10-CM | POA: Diagnosis not present

## 2016-10-29 ENCOUNTER — Ambulatory Visit (HOSPITAL_COMMUNITY): Payer: BLUE CROSS/BLUE SHIELD

## 2016-11-01 ENCOUNTER — Ambulatory Visit (HOSPITAL_COMMUNITY): Payer: BLUE CROSS/BLUE SHIELD

## 2016-11-03 ENCOUNTER — Ambulatory Visit (HOSPITAL_COMMUNITY): Payer: BLUE CROSS/BLUE SHIELD

## 2016-11-05 ENCOUNTER — Ambulatory Visit (HOSPITAL_COMMUNITY): Payer: BLUE CROSS/BLUE SHIELD

## 2016-11-08 ENCOUNTER — Ambulatory Visit (HOSPITAL_COMMUNITY): Payer: BLUE CROSS/BLUE SHIELD

## 2016-11-08 DIAGNOSIS — Z48812 Encounter for surgical aftercare following surgery on the circulatory system: Secondary | ICD-10-CM | POA: Diagnosis not present

## 2016-11-08 DIAGNOSIS — Z953 Presence of xenogenic heart valve: Secondary | ICD-10-CM | POA: Diagnosis not present

## 2016-11-10 ENCOUNTER — Ambulatory Visit (HOSPITAL_COMMUNITY): Payer: BLUE CROSS/BLUE SHIELD

## 2016-11-10 DIAGNOSIS — Z48812 Encounter for surgical aftercare following surgery on the circulatory system: Secondary | ICD-10-CM | POA: Diagnosis not present

## 2016-11-10 DIAGNOSIS — Z953 Presence of xenogenic heart valve: Secondary | ICD-10-CM | POA: Diagnosis not present

## 2016-11-11 DIAGNOSIS — Z48812 Encounter for surgical aftercare following surgery on the circulatory system: Secondary | ICD-10-CM | POA: Diagnosis not present

## 2016-11-11 DIAGNOSIS — Z953 Presence of xenogenic heart valve: Secondary | ICD-10-CM | POA: Diagnosis not present

## 2016-11-12 ENCOUNTER — Ambulatory Visit (HOSPITAL_COMMUNITY): Payer: BLUE CROSS/BLUE SHIELD

## 2016-11-15 ENCOUNTER — Ambulatory Visit (HOSPITAL_COMMUNITY): Payer: BLUE CROSS/BLUE SHIELD

## 2016-11-15 DIAGNOSIS — Z953 Presence of xenogenic heart valve: Secondary | ICD-10-CM | POA: Diagnosis not present

## 2016-11-15 DIAGNOSIS — Z48812 Encounter for surgical aftercare following surgery on the circulatory system: Secondary | ICD-10-CM | POA: Diagnosis not present

## 2016-11-17 ENCOUNTER — Ambulatory Visit (HOSPITAL_COMMUNITY): Payer: BLUE CROSS/BLUE SHIELD

## 2016-11-17 DIAGNOSIS — Z953 Presence of xenogenic heart valve: Secondary | ICD-10-CM | POA: Diagnosis not present

## 2016-11-17 DIAGNOSIS — Z48812 Encounter for surgical aftercare following surgery on the circulatory system: Secondary | ICD-10-CM | POA: Diagnosis not present

## 2016-11-18 DIAGNOSIS — Z953 Presence of xenogenic heart valve: Secondary | ICD-10-CM | POA: Diagnosis not present

## 2016-11-18 DIAGNOSIS — Z48812 Encounter for surgical aftercare following surgery on the circulatory system: Secondary | ICD-10-CM | POA: Diagnosis not present

## 2016-11-19 ENCOUNTER — Ambulatory Visit (HOSPITAL_COMMUNITY): Payer: BLUE CROSS/BLUE SHIELD

## 2016-11-22 ENCOUNTER — Ambulatory Visit (HOSPITAL_COMMUNITY): Payer: BLUE CROSS/BLUE SHIELD

## 2016-11-22 DIAGNOSIS — Z953 Presence of xenogenic heart valve: Secondary | ICD-10-CM | POA: Diagnosis not present

## 2016-11-25 DIAGNOSIS — Z953 Presence of xenogenic heart valve: Secondary | ICD-10-CM | POA: Diagnosis not present

## 2016-11-26 ENCOUNTER — Ambulatory Visit (HOSPITAL_COMMUNITY): Payer: BLUE CROSS/BLUE SHIELD

## 2016-11-29 ENCOUNTER — Ambulatory Visit (HOSPITAL_COMMUNITY): Payer: BLUE CROSS/BLUE SHIELD

## 2016-12-01 ENCOUNTER — Ambulatory Visit (HOSPITAL_COMMUNITY): Payer: BLUE CROSS/BLUE SHIELD

## 2016-12-03 ENCOUNTER — Ambulatory Visit (HOSPITAL_COMMUNITY): Payer: BLUE CROSS/BLUE SHIELD

## 2016-12-06 ENCOUNTER — Ambulatory Visit (HOSPITAL_COMMUNITY): Payer: BLUE CROSS/BLUE SHIELD

## 2016-12-06 NOTE — Anesthesia Postprocedure Evaluation (Deleted)
Anesthesia Post Note  Patient: Jerry HazyDirk Cooper Odonnell  Procedure(s) Performed: Procedure(s) (LRB): MINIMALLY INVASIVE AORTIC VALVE REPLACEMENT (AVR) (N/A) TRANSESOPHAGEAL ECHOCARDIOGRAM (TEE) (N/A)     Anesthesia Post Evaluation  Last Vitals:  Vitals:   07/19/16 0621 07/19/16 1023  BP: 113/63 114/64  Pulse: 68 67  Resp: 18   Temp: 36.4 C     Last Pain:  Vitals:   07/19/16 0621  TempSrc: Oral  PainSc:                  Dayla Gasca,E. Taite Schoeppner

## 2016-12-06 NOTE — Addendum Note (Signed)
Addendum  created 12/06/16 1545 by Mukund Weinreb, MD   Sign clinical note    

## 2016-12-06 NOTE — Addendum Note (Signed)
Addendum  created 12/06/16 1618 by Jairo BenJackson, Vonceil Upshur, MD   Delete clinical note, Sign clinical note

## 2016-12-08 ENCOUNTER — Ambulatory Visit (HOSPITAL_COMMUNITY): Payer: BLUE CROSS/BLUE SHIELD

## 2016-12-10 ENCOUNTER — Ambulatory Visit (HOSPITAL_COMMUNITY): Payer: BLUE CROSS/BLUE SHIELD

## 2016-12-13 ENCOUNTER — Ambulatory Visit (HOSPITAL_COMMUNITY): Payer: BLUE CROSS/BLUE SHIELD

## 2016-12-15 ENCOUNTER — Ambulatory Visit (HOSPITAL_COMMUNITY): Payer: BLUE CROSS/BLUE SHIELD

## 2016-12-17 ENCOUNTER — Ambulatory Visit (HOSPITAL_COMMUNITY): Payer: BLUE CROSS/BLUE SHIELD

## 2016-12-20 ENCOUNTER — Ambulatory Visit (HOSPITAL_COMMUNITY): Payer: BLUE CROSS/BLUE SHIELD

## 2016-12-22 ENCOUNTER — Ambulatory Visit (HOSPITAL_COMMUNITY): Payer: BLUE CROSS/BLUE SHIELD

## 2016-12-24 ENCOUNTER — Ambulatory Visit (HOSPITAL_COMMUNITY): Payer: BLUE CROSS/BLUE SHIELD

## 2016-12-27 ENCOUNTER — Ambulatory Visit (HOSPITAL_COMMUNITY): Payer: BLUE CROSS/BLUE SHIELD

## 2016-12-27 ENCOUNTER — Other Ambulatory Visit: Payer: Self-pay | Admitting: Physician Assistant

## 2016-12-27 ENCOUNTER — Encounter: Payer: Self-pay | Admitting: Physician Assistant

## 2016-12-27 DIAGNOSIS — H1131 Conjunctival hemorrhage, right eye: Secondary | ICD-10-CM | POA: Diagnosis not present

## 2016-12-27 MED ORDER — AMLODIPINE BESYLATE 2.5 MG PO TABS: 2.5000 mg | ORAL_TABLET | Freq: Every day | ORAL | 6 refills | Status: DC

## 2016-12-27 NOTE — Progress Notes (Signed)
Pt emailed his BPs from cardiac rehab. SBP frequently >130 with one reading of 115. HR 50s-60s Will add amlodipine 2.5 mg and see how tolerated.  Theodore DemarkBarrett, Aamari Strawderman, PA-C 12/27/2016 10:20 AM Beeper [phone]

## 2016-12-29 ENCOUNTER — Ambulatory Visit (HOSPITAL_COMMUNITY): Payer: BLUE CROSS/BLUE SHIELD

## 2016-12-31 ENCOUNTER — Ambulatory Visit (HOSPITAL_COMMUNITY): Payer: BLUE CROSS/BLUE SHIELD

## 2017-01-03 ENCOUNTER — Ambulatory Visit (HOSPITAL_COMMUNITY): Payer: BLUE CROSS/BLUE SHIELD

## 2017-01-05 ENCOUNTER — Ambulatory Visit (HOSPITAL_COMMUNITY): Payer: BLUE CROSS/BLUE SHIELD

## 2017-01-07 ENCOUNTER — Ambulatory Visit (HOSPITAL_COMMUNITY): Payer: BLUE CROSS/BLUE SHIELD

## 2017-01-10 ENCOUNTER — Ambulatory Visit (HOSPITAL_COMMUNITY): Payer: BLUE CROSS/BLUE SHIELD

## 2017-01-12 ENCOUNTER — Ambulatory Visit (HOSPITAL_COMMUNITY): Payer: BLUE CROSS/BLUE SHIELD

## 2017-01-14 ENCOUNTER — Ambulatory Visit (HOSPITAL_COMMUNITY): Payer: BLUE CROSS/BLUE SHIELD

## 2017-01-17 ENCOUNTER — Ambulatory Visit (HOSPITAL_COMMUNITY): Payer: BLUE CROSS/BLUE SHIELD

## 2017-01-19 ENCOUNTER — Ambulatory Visit (HOSPITAL_COMMUNITY): Payer: BLUE CROSS/BLUE SHIELD

## 2017-01-21 ENCOUNTER — Ambulatory Visit (HOSPITAL_COMMUNITY): Payer: BLUE CROSS/BLUE SHIELD

## 2017-02-02 ENCOUNTER — Encounter: Payer: Self-pay | Admitting: Physician Assistant

## 2017-02-03 ENCOUNTER — Telehealth: Payer: Self-pay

## 2017-02-03 MED ORDER — METOPROLOL TARTRATE 25 MG PO TABS: 25.0000 mg | ORAL_TABLET | Freq: Two times a day (BID) | ORAL | 3 refills | Status: DC

## 2017-02-03 NOTE — Telephone Encounter (Signed)
Returned call to patient he stated he wanted to let Jerry Demarkhonda Barrett PA know since he started taking Amlodipine 2.5 mg daily his weight has increased 3 lbs and he has swelling in both feet.He is still taking Lasix 40 mg daily and is unable to wean off like she suggested.B/P 120/70 pulse 60.He wanted Rhonda's advice on what to do.Message sent to [facility]onda Barrett PA.

## 2017-02-03 NOTE — Telephone Encounter (Signed)
Swelling is a common side effect of the amlodipine. Okay to stop the amlodipine. Try increasing his metoprolol to a whole tablet which is 25 mg twice a day. If he tolerates this, fine. If not, we will have to add another medication.

## 2017-02-03 NOTE — Telephone Encounter (Signed)
Spoke to patient Jerry Odonnell's recommendations given.Advised to monitor B/P and keep appointment with Dr.Berry as planned.

## 2017-02-21 ENCOUNTER — Encounter: Payer: Self-pay | Admitting: Cardiovascular Disease

## 2017-02-22 ENCOUNTER — Ambulatory Visit (INDEPENDENT_AMBULATORY_CARE_PROVIDER_SITE_OTHER): Payer: BLUE CROSS/BLUE SHIELD | Admitting: Cardiovascular Disease

## 2017-02-22 ENCOUNTER — Encounter: Payer: Self-pay | Admitting: Cardiovascular Disease

## 2017-02-22 VITALS — BP 116/72 | HR 54 | Ht 71.0 in | Wt 192.0 lb

## 2017-02-22 DIAGNOSIS — Z953 Presence of xenogenic heart valve: Secondary | ICD-10-CM | POA: Diagnosis not present

## 2017-02-22 DIAGNOSIS — E785 Hyperlipidemia, unspecified: Secondary | ICD-10-CM

## 2017-02-22 DIAGNOSIS — Z952 Presence of prosthetic heart valve: Secondary | ICD-10-CM | POA: Diagnosis not present

## 2017-02-22 MED ORDER — FUROSEMIDE 20 MG PO TABS: 20.0000 mg | ORAL_TABLET | Freq: Two times a day (BID) | ORAL | 3 refills | Status: DC

## 2017-02-22 NOTE — Progress Notes (Signed)
02/22/2017 Jerry Odonnell   1956/04/08  [id number]  Primary Physician Loyal Jacobson, MD Primary Cardiologist: Runell Gess MD [name], [place]  HPI:  Jerry Odonnell is a 61 y.o. 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 He was referred for evaluation and treatment of severe aortic stenosis. I last saw him in the office 07/07/16. He has no cardiac risk factors. He is active and walks 4-5 miles a day. PCP heard a murmur and followed up with a 2-D echocardiogram 04/28/16 that revealed normal LV size and function, moderate concentric LVH with severe aortic stenosis. The mean gradient was 86 mm of mercury. He has had one episode of dizziness back in August. He denies chest pain or shortness of breath. My intention was to perform elective right and left heart cath on him 2 weeks after his initial visit however 2 days later he had syncope and was brought to [facility]. He had a right left heart cath performed by Dr. Herbie Baltimore confirming critical aortic stenosis with minimal CAD. He was seen by Dr. Laneta Simmers and caused consultation the following day and several days later on 07/14/16 he underwent minimally invasive aortic valve replacement by Dr. Cornelius Moras with a 23 mm Edwards Intuity rapid deployment pericardial tissue valve. He had some chest wall bleeding requiring reexploration the same day and was discharged 5 days later. He did participate in the rehabilitation program at [facility]. He exercises 3 times a week with a personal trainer. He is on twice a day diuretics because of fluid retention.   Current Meds  Medication Sig  . aspirin EC 81 MG tablet Take 81 mg by mouth daily.  [name] atorvastatin (LIPITOR) 10 MG tablet Take 10 mg by mouth daily.  [name] EPINEPHrine (EPIPEN 2-PAK) 0.3 mg/0.3 mL IJ SOAJ injection Inject 0.3 mLs into the muscle as  directed.  . fluticasone (FLONASE) 50 MCG/ACT nasal spray Place 1 spray into the nose as directed.  . furosemide (LASIX) 20 MG tablet Take 1 tablet (20 mg total) by mouth 2 (two) times daily.  . meclizine (ANTIVERT) 25 MG tablet Take 25 mg by mouth as directed.  . metoprolol tartrate (LOPRESSOR) 25 MG tablet Take 1 tablet (25 mg total) by mouth 2 (two) times daily.  . Potassium Chloride ER 20 MEQ TBCR Take 10 mEq by mouth daily.  . [DISCONTINUED] furosemide (LASIX) 40 MG tablet Take 40 mg by mouth 2 (two) times daily.     Allergies  Allergen Reactions  . Bee Venom Swelling    SWELLING REACTION UNSPECIFIED     Social History   Social History  . Marital status: Widowed    Spouse name: N/A  . Number of children: N/A  . Years of education: N/A   Occupational History  . Not on file.   Social History Main Topics  . Smoking status: Never Smoker  . Smokeless tobacco: Never Used  . Alcohol use 4.2 oz/week    7 Glasses of wine per week     Comment: approximately 1 drink per day  . Drug use: No  . Sexual activity: Not on file   Other Topics Concern  . Not on file   Social History Narrative  . No narrative on file     Review of Systems: General: negative for chills, fever, night  sweats or weight changes.  Cardiovascular: negative for chest pain, dyspnea on exertion, edema, orthopnea, palpitations, paroxysmal nocturnal dyspnea or shortness of breath Dermatological: negative for rash Respiratory: negative for cough or wheezing Urologic: negative for hematuria Abdominal: negative for nausea, vomiting, diarrhea, bright red blood per rectum, melena, or hematemesis Neurologic: negative for visual changes, syncope, or dizziness All other systems reviewed and are otherwise negative except as noted above.    Blood pressure 116/72, pulse (!) 54, height  (1.803 m), weight 192 lb (87.1 kg).  General appearance: alert and no distress Neck: no adenopathy, no carotid bruit, no JVD,  supple, symmetrical, trachea midline and thyroid not enlarged, symmetric, no tenderness/mass/nodules Lungs: clear to auscultation bilaterally Heart: regular rate and rhythm, S1, S2 normal, no murmur, click, rub or gallop Extremities: extremities normal, atraumatic, no cyanosis or edema Pulses: 2+ and symmetric Skin: Skin color, texture, turgor normal. No rashes or lesions Neurologic: Alert and oriented X 3, normal strength and tone. Normal symmetric reflexes. Normal coordination and gait  EKG sinus bradycardia at 54 with left anterior fascicular block and QRS widening consistent with a nonspecific IVCD. I personally reviewed this EKG.  ASSESSMENT AND PLAN:   Hyperlipidemia History of mild hyperlipidemia on statin therapy followed by his PCP  S/P minimally invasive aortic valve replacement with bioprosthetic valve History of critical aortic stenosis with syncope. He had right left heart cath by Dr. Herbie Baltimore on 07/09/16 revealing critical areas with essentially normal coronary arteries. He underwent minimally invasive aortic valve replacement by Dr. Cornelius Moras on 07/14/16 with a 23 mm Edwards Intuity Elite  rapid deployment pericardial tissue valve. He had reexploration because of chest wall bleeding and was discharged on 07/19/16. A follow-up 2-D echo performed 08/26/16 revealed normal LV function with a well functioning aortic bioprosthesis, valve area of 1.08 cm with a peak gradient of 21 mmHg. He denies chest pain or shortness of breath. He has had some viral retention requiring oral diuretics. We'll continue follow him to get going angle basis.      Runell Gess MD FACP,FACC,FAHA, [facility] 02/22/2017 9:59 AM

## 2017-02-22 NOTE — Assessment & Plan Note (Signed)
History of critical aortic stenosis with syncope. He had right left heart cath by Dr. Herbie Baltimore on 07/09/16 revealing critical areas with essentially normal coronary arteries. He underwent minimally invasive aortic valve replacement by Dr. Cornelius Moras on 07/14/16 with a 23 mm Edwards Intuity Elite  rapid deployment pericardial tissue valve. He had reexploration because of chest wall bleeding and was discharged on 07/19/16. A follow-up 2-D echo performed 08/26/16 revealed normal LV function with a well functioning aortic bioprosthesis, valve area of 1.08 cm with a peak gradient of 21 mmHg. He denies chest pain or shortness of breath. He has had some viral retention requiring oral diuretics. We'll continue follow him to get going angle basis.

## 2017-02-22 NOTE — Patient Instructions (Signed)
Medication Instructions: Your physician recommends that you continue on your current medications as directed. Please refer to the Current Medication list given to you today.  Decrease Lasix to 20 mg.   Follow-Up: We request that you follow-up in: 3 months with Theodore Demark, PA and in 6 months with Dr Allyson Sabal.  You will receive a reminder letter in the mail two months in advance. If you don't receive a letter, please call our office to   If you need a refill on your cardiac medications before your next appointment, please call your pharmacy.

## 2017-02-22 NOTE — Assessment & Plan Note (Signed)
History of mild hyperlipidemia on statin therapy followed by his PCP

## 2017-03-19 DIAGNOSIS — Z0000 Encounter for general adult medical examination without abnormal findings: Secondary | ICD-10-CM | POA: Diagnosis not present

## 2017-03-23 DIAGNOSIS — J309 Allergic rhinitis, unspecified: Secondary | ICD-10-CM | POA: Diagnosis not present

## 2017-03-23 DIAGNOSIS — E785 Hyperlipidemia, unspecified: Secondary | ICD-10-CM | POA: Diagnosis not present

## 2017-03-23 DIAGNOSIS — Z91038 Other insect allergy status: Secondary | ICD-10-CM | POA: Diagnosis not present

## 2017-03-23 DIAGNOSIS — R030 Elevated blood-pressure reading, without diagnosis of hypertension: Secondary | ICD-10-CM | POA: Diagnosis not present

## 2017-03-23 DIAGNOSIS — Z0000 Encounter for general adult medical examination without abnormal findings: Secondary | ICD-10-CM | POA: Diagnosis not present

## 2017-03-23 DIAGNOSIS — Z1211 Encounter for screening for malignant neoplasm of colon: Secondary | ICD-10-CM | POA: Diagnosis not present

## 2017-06-01 ENCOUNTER — Ambulatory Visit
Admission: RE | Admit: 2017-06-01 | Discharge: 2017-06-01 | Disposition: A | Discharge status: Home / Self Care | Payer: BLUE CROSS/BLUE SHIELD | Source: Ambulatory Visit | Attending: Family Medicine | Admitting: Family Medicine

## 2017-06-01 ENCOUNTER — Other Ambulatory Visit: Payer: Self-pay | Admitting: Family Medicine

## 2017-06-01 DIAGNOSIS — M7989 Other specified soft tissue disorders: Secondary | ICD-10-CM

## 2017-06-01 DIAGNOSIS — M79604 Pain in right leg: Secondary | ICD-10-CM

## 2017-06-05 IMAGING — CR DG CHEST 2V
2 series · 2 of 2 positions shown · non-contrast
Comparison: 07/28/2016 and CT chest 07/12/2016.

CLINICAL DATA: Abnormal chest radiograph, productive cough and
recent fever.

EXAM:
CHEST  2 VIEW

[w chest pa]
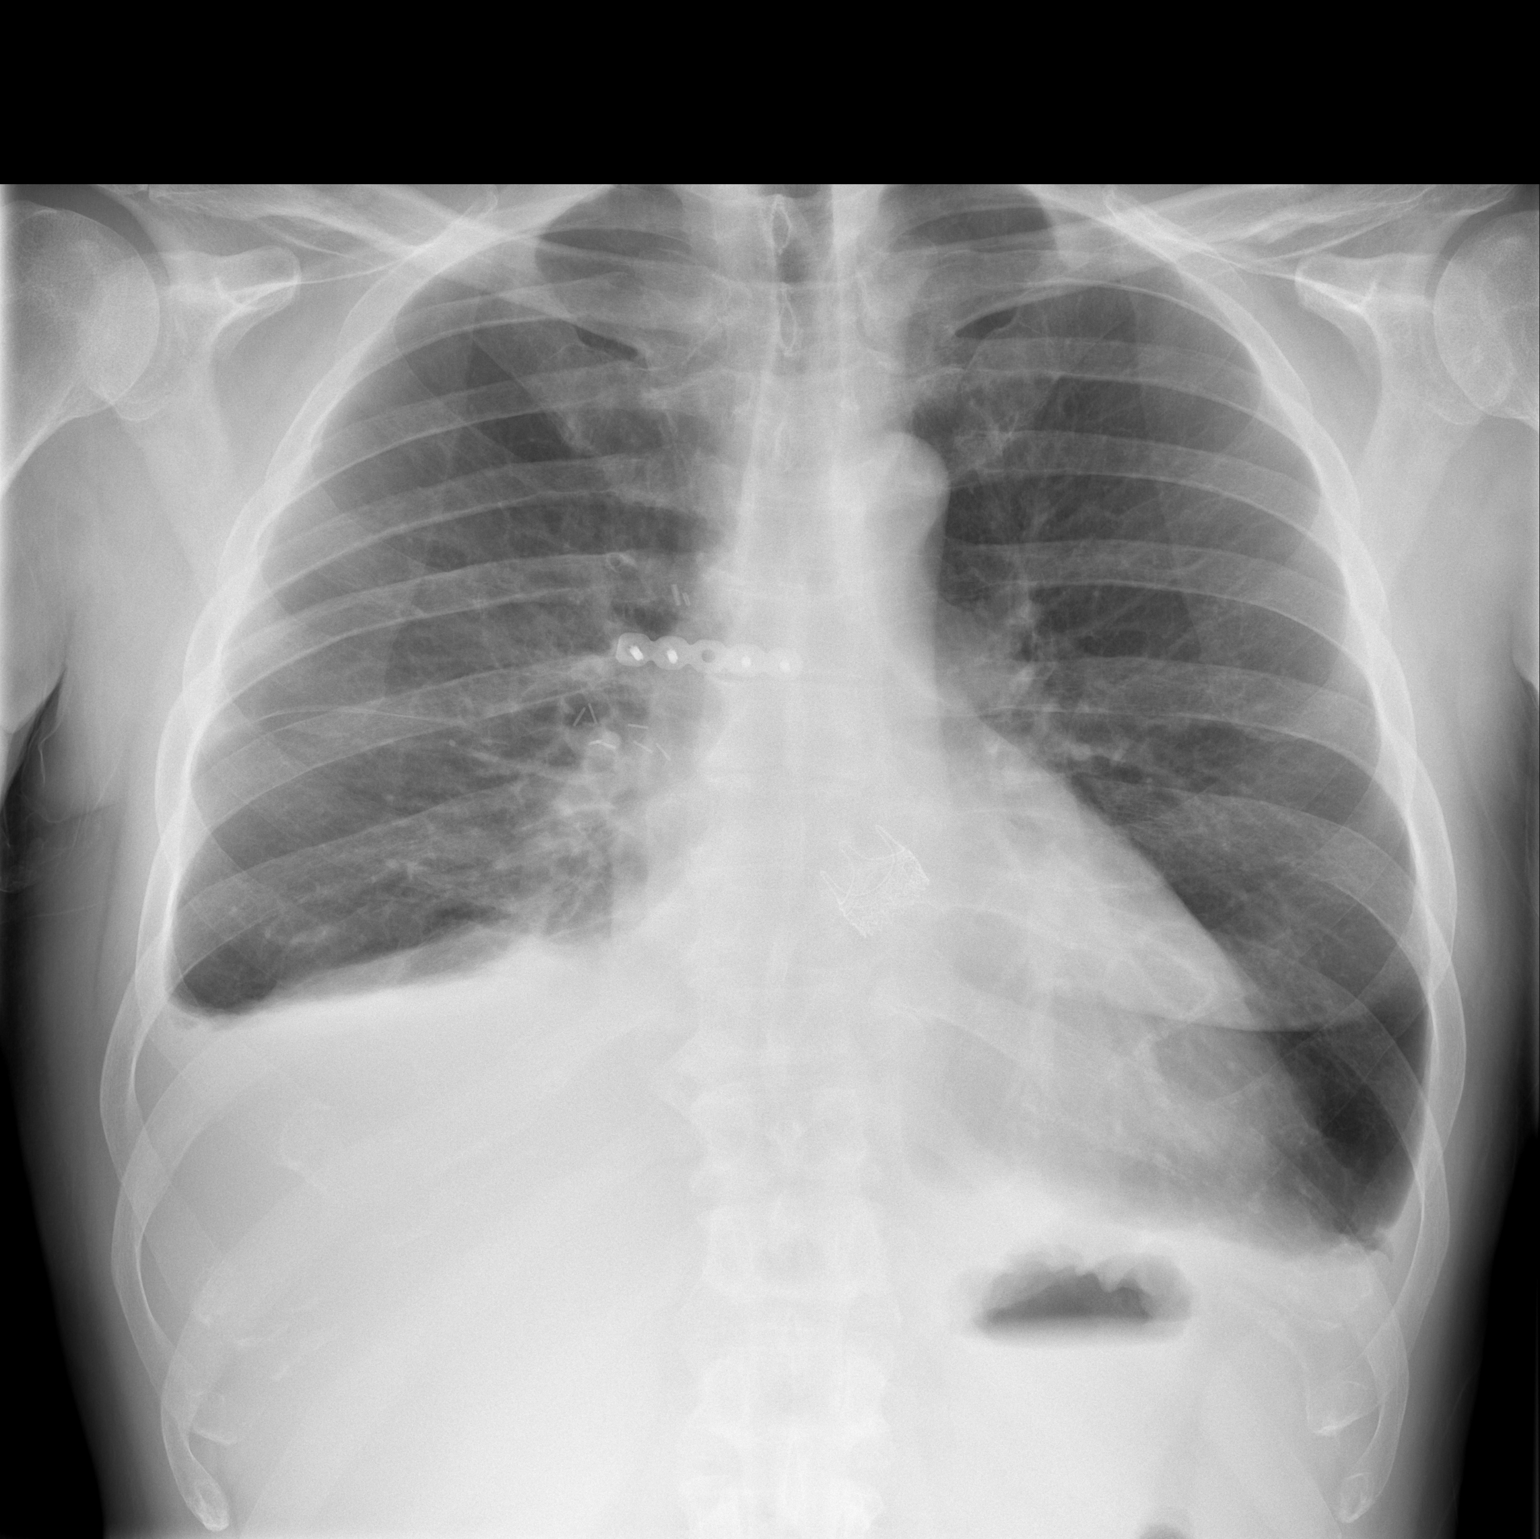

[w chest lat]
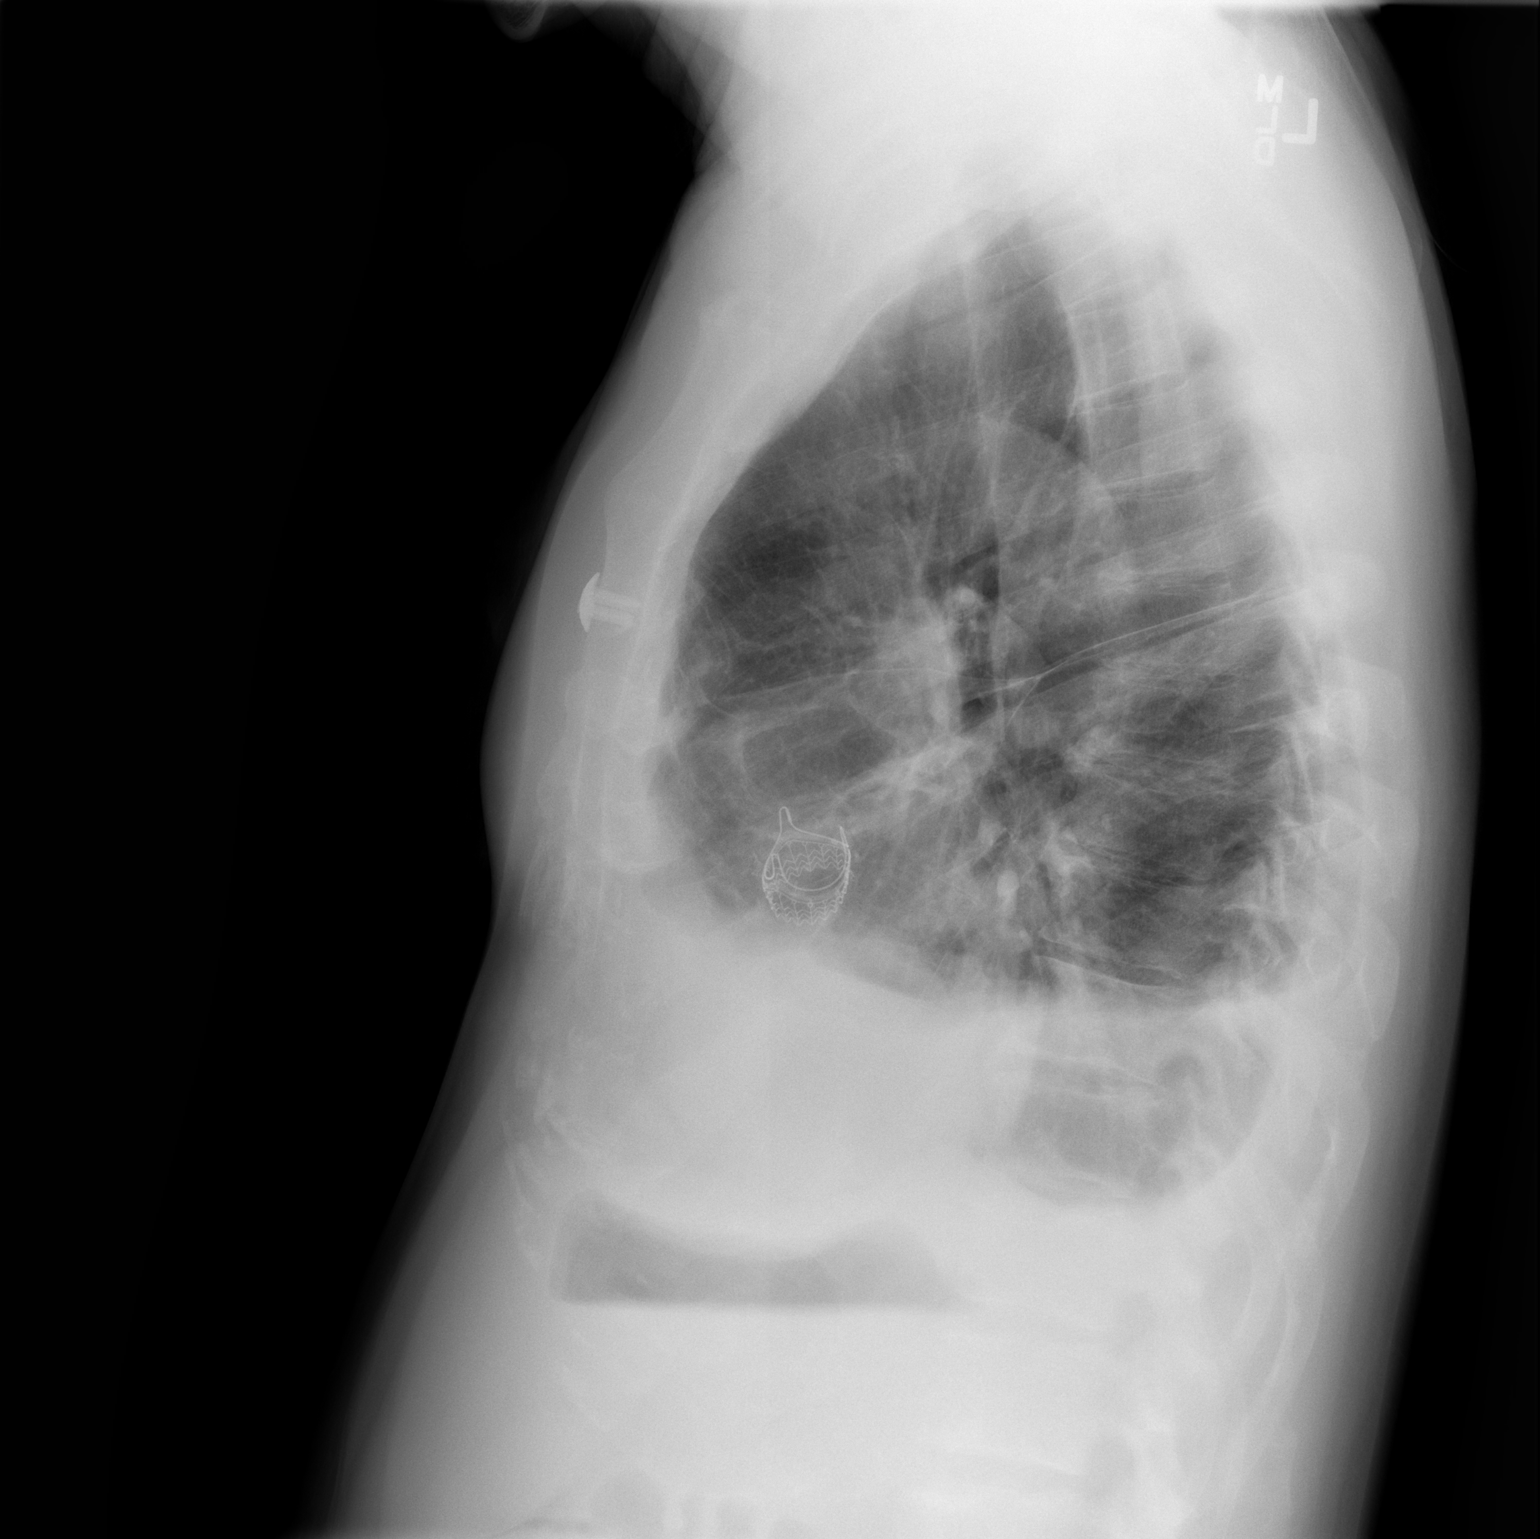

[2 of 2 positions shown; findings below may reference images not displayed]

FINDINGS: Trachea is midline. Heart size stable. Aortic valve replacement.
Small bilateral pleural effusions and bibasilar atelectasis. No
edema. No pneumothorax. Malleable plate and screw fixation is seen
along the right aspect of the sternum.
IMPRESSION: Small bilateral pleural effusions and bibasilar atelectasis.

## 2017-06-26 ENCOUNTER — Encounter: Payer: Self-pay | Admitting: Physician Assistant

## 2017-06-27 ENCOUNTER — Encounter: Payer: Self-pay | Admitting: Physician Assistant

## 2017-06-28 ENCOUNTER — Ambulatory Visit (INDEPENDENT_AMBULATORY_CARE_PROVIDER_SITE_OTHER): Payer: BLUE CROSS/BLUE SHIELD | Admitting: Physician Assistant

## 2017-06-28 ENCOUNTER — Encounter: Payer: Self-pay | Admitting: Physician Assistant

## 2017-06-28 VITALS — BP 126/72 | HR 55 | Ht 71.0 in | Wt 201.6 lb

## 2017-06-28 DIAGNOSIS — Z79899 Other long term (current) drug therapy: Secondary | ICD-10-CM | POA: Diagnosis not present

## 2017-06-28 DIAGNOSIS — Z953 Presence of xenogenic heart valve: Secondary | ICD-10-CM

## 2017-06-28 NOTE — Patient Instructions (Signed)
Medication Instructions:  Your physician recommends that you continue on your current medications as directed. Please refer to the Current Medication list given to you today.   Labwork: none  Testing/Procedures: Your physician has requested that you have an echocardiogram. Echocardiography is a painless test that uses sound waves to create images of your heart. It provides your doctor with information about the size and shape of your heart and how well your heart's chambers and valves are working. This procedure takes approximately one hour. There are no restrictions for this procedure. 2 months BEFORE 1 year follow-up with Dr. Allyson SabalBerry    Follow-Up: Your physician recommends that you schedule a follow-up appointment in: 3 months with Dr. Allyson SabalBerry    Any Other Special Instructions Will Be Listed Below (If Applicable).     If you need a refill on your cardiac medications before your next appointment, please call your pharmacy.

## 2017-06-28 NOTE — Progress Notes (Signed)
Cardiology Office Note   Date:  06/28/2017   ID:  Jerry Odonnell, DOB 1955-06-21, MRN [id number]  PCP:  Loyal JacobsonKalish, Michael, MD  Cardiologist: Dr. Allyson SabalBerry, 02/22/2017 Theodore Demarkhonda Barrett, PA-C 09/06/2016  Chief Complaint  Patient presents with  . Follow-up    History of Present Illness: Jerry DyDirk Cooper Wince is a 62 y.o. male with a history of history of minimally invasive AVR 07/14/16 Edwards Intuity Elite Rapid DeploymentPericardial Tissue Valve (size 23mm, model#8300AB, serial [id number]), re-exploration for bleeding; TIA, HLD, syncope prior to AVR, normal EF with moderate concentric LVH by echo  Jerry Odonnell presents for cardiology follow up.  He occasionally gets right calf pain, Dopplers were negative for DVT.   He has had a dizzy spell, not during or after exercise. He is exercising 3 x week with a trainer for strengthening, does cardio on his own. His highest heart rate was 130, when he was doing some running on a treadmill. His heart rate generally was about 110. He [facility]   He has not taken Lasix in over 2 months. He does not wake with LE edema, denies orthopnea or PND. His BP is generally well-controlled, SBP will get to 130s w/ exertion.   No CP w/ exertion.    Past Medical History:  Diagnosis Date  . Aortic stenosis   . Hyperlipidemia   . Personal history of TIA (transient ischemic attack)    [facility] with reported small lesion seen on imaging  . S/P minimally invasive aortic valve replacement with bioprosthetic valve 07/14/2016   23 mm Edwards Intuity Elite rapid deployment bioprosthetic tissue valve via right mini thoracotomy approach  . Syncope and collapse 07/09/2016  . Thrombocytopenia (HCC)     Past Surgical History:  Procedure Laterality Date  . AORTIC VALVE REPLACEMENT N/A 07/14/2016   Procedure: MINIMALLY INVASIVE AORTIC VALVE REPLACEMENT (AVR);  Surgeon: Purcell Nailslarence H Owen, MD;  [facility];  Service: Open Heart Surgery;  Laterality: N/A;  . CHEST EXPLORATION Right 07/14/2016   Procedure: REEXPLORATION OF RIGHT THORACOTOMY FOR BLEEDING;  Surgeon: Purcell Nailslarence H Owen, MD;  Location: [facility] OR;  Service: Open Heart Surgery;  Laterality: Right;  . CHEST TUBE INSERTION N/A 07/16/2016   Procedure: CHEST TUBE REMOVAL, right chest;  Surgeon: Purcell Nailslarence H Owen, MD;  Location: [facility] OR;  Service: Thoracic;  Laterality: N/A;  . POSTERIOR CERVICAL LAMINECTOMY  2013  . RIGHT/LEFT HEART CATH AND CORONARY ANGIOGRAPHY N/A 07/09/2016   Procedure: Right/Left Heart Cath and Coronary Angiography;  Surgeon: Marykay Lexavid W Harding, MD;  [facility] CV LAB;  Service: Cardiovascular;  Laterality: N/A;  . TEE WITHOUT CARDIOVERSION N/A 07/14/2016   Procedure: TRANSESOPHAGEAL ECHOCARDIOGRAM (TEE);  Surgeon: Purcell Nailslarence H Owen, MD;  [facility];  Service: Open Heart Surgery;  Laterality: N/A;  . TEE WITHOUT CARDIOVERSION N/A 07/14/2016   Procedure: TRANSESOPHAGEAL ECHOCARDIOGRAM (TEE);  Surgeon: Purcell Nailslarence H Owen, MD;  [facility];  Service: Open Heart Surgery;  Laterality: N/A;    Current Outpatient Medications  Medication Sig Dispense Refill  . aspirin EC 81 MG tablet Take 81 mg by mouth daily.    [name]. atorvastatin (LIPITOR) 10 MG tablet Take 10 mg by mouth daily.    [name]. EPINEPHrine (EPIPEN 2-PAK) 0.3 mg/0.3 mL IJ SOAJ injection Inject 0.3 mLs into the muscle as directed.    . fluticasone (FLONASE) 50 MCG/ACT nasal spray Place 1 spray into the nose as directed.    .[name]  meclizine (ANTIVERT) 25 MG tablet Take 25 mg by mouth as directed.    . metoprolol tartrate (LOPRESSOR) 25 MG tablet Take 25 mg by mouth 2 (two) times daily.     No current facility-administered medications for this visit.     Allergies:   Bee venom    Social History:  The patient  reports that  has never smoked. he has never used smokeless tobacco. He reports that he drinks about 4.2 oz of alcohol per week. He reports that he does not use drugs.    Family History:  The patient's family history includes Healthy in his brother.    ROS:  Please see the history of present illness. All other systems are reviewed and negative.    PHYSICAL EXAM: VS:  BP 126/72   Pulse (!) 55   Ht 5\' 11"  (1.803 m)   Wt 201 lb 9.6 oz (91.4 kg)   SpO2 98%   BMI 28.12 kg/m  , BMI Body mass index is 28.12 kg/m. GEN: Well nourished, well developed, male in no acute distress  HEENT: normal for age  Neck: no JVD, no carotid bruit, no masses Cardiac: RRR; 2/6 murmur, no rubs, or gallops Respiratory:  clear to auscultation bilaterally, normal work of breathing GI: soft, nontender, nondistended, + BS MS: no deformity or atrophy; no edema; distal pulses are 2+ in all 4 extremities   Skin: warm and dry, no rash Neuro:  Strength and sensation are intact Psych: euthymic mood, full affect   EKG:  EKG is not ordered today.  ECHO: 08/26/2016 - Left ventricle: The cavity size was normal. There was mild   concentric hypertrophy. Systolic function was normal. The   estimated ejection fraction was in the range of 60% to 65%. Wall   motion was normal; there were no regional wall motion   abnormalities. Features are consistent with a pseudonormal left   ventricular filling pattern, with concomitant abnormal relaxation   and increased filling pressure (grade 2 diastolic dysfunction). - Ventricular septum: Septal motion showed paradox. These changes   are consistent with a post-thoracotomy state. - Aortic valve: A bioprosthesis was present and functioning   normally. Valve area (VTI): 1.08 cm^2. Valve area (Vmax): 1.08   cm^2. Valve area (Vmean): 0.97 cm^2.   Mean gradient (S): 10 mm Hg. Peak gradient (S): 21 mm Hg. - Atrial septum: No defect or patent foramen ovale was identified.   Recent Labs: 07/09/2016: ALT 42 07/15/2016: Magnesium 2.0 08/05/2016: Hemoglobin 10.4; Platelets 449 08/18/2016: Brain Natriuretic Peptide 126.5; BUN 22; Creat 0.90; Potassium  5.0; Sodium 138; TSH 0.34    Lipid Panel No results found for: CHOL, TRIG, HDL, CHOLHDL, VLDL, LDLCALC, LDLDIRECT   Wt Readings from Last 3 Encounters:  06/28/17 201 lb 9.6 oz (91.4 kg)  02/22/17 192 lb (87.1 kg)  09/06/16 190 lb 3.2 oz (86.3 kg)     Other studies Reviewed: Additional studies/ records that were reviewed today include: office notes, hospital records and testing.  ASSESSMENT AND PLAN:  1.  S/p minimally invasive AVR: I explained that no further workup is needed.  He is very stable from a cardiac standpoint.  We can check an echo in 3 months because that will be a year since his visit.  He can see Dr. Allyson Sabal at that time.  After that, since his vital signs are stable, and his volume status is stable, we will not have to see him before every 6 months or a year.  He is agreeable  to that.  He asks about decreasing the beta-blocker, but since he is off the Lasix, I feel he needs the beta-blocker.  He has had some heart rates in the 50s and in the low 50s, especially when first waking up.  He is asymptomatic with this and as long as he is asymptomatic, advised I would not make any med changes.  He is agreeable to this.   Current medicines are reviewed at length with the patient today.  The patient has concerns regarding medicines.  Concerns were addressed  The following changes have been made:  no change  Labs/ tests ordered today include:   Orders Placed This Encounter  Procedures  . ECHOCARDIOGRAM COMPLETE    Disposition:   FU with Dr. Allyson Sabal  Signed, Theodore Demark, PA-C  06/28/2017 11:56 AM     [facility] Phone: [phone]; Fax: [phone]  This note was written with the assistance of speech recognition software. Please excuse any transcriptional errors.

## 2017-07-11 ENCOUNTER — Encounter: Payer: BLUE CROSS/BLUE SHIELD | Admitting: Thoracic Surgery (Cardiothoracic Vascular Surgery)

## 2017-07-18 ENCOUNTER — Encounter: Payer: BLUE CROSS/BLUE SHIELD | Admitting: Thoracic Surgery (Cardiothoracic Vascular Surgery)

## 2017-07-25 ENCOUNTER — Encounter: Payer: Self-pay | Admitting: Thoracic Surgery (Cardiothoracic Vascular Surgery)

## 2017-07-25 ENCOUNTER — Ambulatory Visit: Payer: BLUE CROSS/BLUE SHIELD | Admitting: Thoracic Surgery (Cardiothoracic Vascular Surgery)

## 2017-07-25 VITALS — BP 137/82 | HR 61 | Resp 20 | Ht 71.0 in | Wt 201.0 lb

## 2017-07-25 DIAGNOSIS — Z953 Presence of xenogenic heart valve: Secondary | ICD-10-CM

## 2017-07-25 NOTE — Patient Instructions (Signed)

## 2017-07-25 NOTE — Progress Notes (Signed)
[address]       [place] [id number]             [phone]     CARDIOTHORACIC SURGERY OFFICE NOTE  Referring Provider is Runell GessBerry, Jonathan J, MD PCP is Loyal JacobsonKalish, Michael, MD   HPI:  Patient is a 62 year old male who returns the office today for routine follow-up status post minimally invasive aortic valve replacement using a bioprosthetic tissue valve on July 14, 2016 for bicuspid aortic valve disease with critical aortic stenosis.  Routine postoperative echocardiogram performed August 26, 2016 revealed normal functioning bioprosthetic tissue valve in aortic position with normal left ventricular systolic function.  Peak velocity across the aortic valve was reported 2.3 m/s corresponding to mean transvalvular gradient estimated 10 mmHg.  There was no aortic insufficiency.  He was last seen here in our office on September 06, 2016 at which time he was doing very well.  He returns to our office today and reports that he has done extremely well.  He states that his exercise tolerance is much better than it has been for many years.  He completed the cardiac rehab program and he continues to exercise almost every single day.  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  He states that he just feels terrific.  He specifically denies any exertional shortness of breath or chest discomfort.  He is very pleased with his progress.   Current Outpatient Medications  Medication Sig Dispense Refill  . aspirin EC 81 MG tablet Take 81 mg by mouth daily.    [name]. atorvastatin (LIPITOR) 10 MG tablet Take 10 mg by mouth daily.    [name]. EPINEPHrine (EPIPEN 2-PAK) 0.3 mg/0.3 mL IJ SOAJ injection Inject 0.3 mLs into the muscle as directed.    . fluticasone (FLONASE) 50 MCG/ACT nasal spray Place 1 spray into the nose as directed.    . meclizine (ANTIVERT) 25 MG tablet Take 25 mg by mouth as directed.    . metoprolol tartrate (LOPRESSOR) 25 MG tablet Take 25 mg by mouth 2 (two) times daily.     No  current facility-administered medications for this visit.       Physical Exam:   BP 137/82   Pulse 61   Resp 20   Ht 5\' 11"  (1.803 m)   Wt 201 lb (91.2 kg)   SpO2 98% Comment: RA  BMI 28.03 kg/m   General:  Well-appearing  Chest:   Clear to auscultation  CV:   Regular rate and rhythm without murmur  Incisions:  Completely healed  Abdomen:  Soft nontender  Extremities:  Warm and well perfused  Diagnostic Tests:  Transthoracic Echocardiography  Patient:    Lorene DyDrost, Whitney Cooper MR #:       [id number] Study Date: 08/26/2016 Gender:     M Age:        [age] Height:     180.3 cm Weight:     83 kg BSA:        2.05 m^2 Pt. Status: Room:   ATTENDING    Jessica PriestKilroy, Luke K  ORDERING     Kilroy, Luke K  REFERRING    Corine ShelterKilroy, Luke K  PERFORMING   [facility], Outpatient  SONOGRAPHER  Thurman Coyerasey Kirkpatrick  cc:  ------------------------------------------------------------------- LV EF: 60% -   65%  ------------------------------------------------------------------- Indications:      Dyspnea 786.09.  ------------------------------------------------------------------- History:   Risk factors:  Dyslipidemia.  ------------------------------------------------------------------- Study Conclusions  - Left ventricle:  The cavity size was normal. There was mild   concentric hypertrophy. Systolic function was normal. The   estimated ejection fraction was in the range of 60% to 65%. Wall   motion was normal; there were no regional wall motion   abnormalities. Features are consistent with a pseudonormal left   ventricular filling pattern, with concomitant abnormal relaxation   and increased filling pressure (grade 2 diastolic dysfunction). - Ventricular septum: Septal motion showed paradox. These changes   are consistent with a post-thoracotomy state. - Aortic valve: A bioprosthesis was present and functioning   normally. Valve area (VTI): 1.08 cm^2. Valve area (Vmax): 1.08   cm^2. Valve  area (Vmean): 0.97 cm^2. - Atrial septum: No defect or patent foramen ovale was identified.  ------------------------------------------------------------------- Labs, prior tests, procedures, and surgery: Valve surgery (07/14/2016).     Aortic valve replacement with a bioprosthetic valve.  ------------------------------------------------------------------- Study data:  Comparison was made to the study of 07/12/2016.  Study status:  Routine.  Procedure:  The patient reported no pain pre or post test. Transthoracic echocardiography. Image quality was adequate.  Study completion:  There were no complications. Transthoracic echocardiography.  M-mode, complete 2D, spectral Doppler, and color Doppler.  Birthdate:  Patient birthdate: [birth year].  Age:  Patient is 62 yr old.  Sex:  Gender: male. BMI: 25.5 kg/m^2.  Blood pressure:     114/67  Patient status: Outpatient.  Study date:  Study date: 08/26/2016. Study time: 10:07 AM.  Location:  Echo laboratory.  -------------------------------------------------------------------  ------------------------------------------------------------------- Left ventricle:  The cavity size was normal. There was mild concentric hypertrophy. Systolic function was normal. The estimated ejection fraction was in the range of 60% to 65%. Wall motion was normal; there were no regional wall motion abnormalities. Features are consistent with a pseudonormal left ventricular filling pattern, with concomitant abnormal relaxation and increased filling pressure (grade 2 diastolic dysfunction).  ------------------------------------------------------------------- Aortic valve:  A bioprosthesis was present and functioning normally.  Doppler:  There was no regurgitation.    VTI ratio of LVOT to aortic valve: 0.48. Valve area (VTI): 1.08 cm^2. Indexed valve area (VTI): 0.53 cm^2/m^2. Peak velocity ratio of LVOT to aortic valve: 0.47. Valve area (Vmax): 1.08 cm^2.  Indexed valve area (Vmax): 0.53 cm^2/m^2. Mean velocity ratio of LVOT to aortic valve: 0.43. Valve area (Vmean): 0.97 cm^2. Indexed valve area (Vmean): 0.47 cm^2/m^2.    Mean gradient (S): 10 mm Hg. Peak gradient (S): 21 mm Hg.  ------------------------------------------------------------------- Aorta:  The aorta was poorly visualized. Aortic root: The aortic root was normal in size. Ascending aorta: The ascending aorta was normal in size.  ------------------------------------------------------------------- Mitral valve:   Structurally normal valve.   Leaflet separation was normal.  Doppler:  Transvalvular velocity was within the normal range. There was no evidence for stenosis. There was no regurgitation.    Peak gradient (D): 4 mm Hg.  ------------------------------------------------------------------- Left atrium:  The atrium was at the upper limits of normal in size.   ------------------------------------------------------------------- Atrial septum:  No defect or patent foramen ovale was identified.   ------------------------------------------------------------------- Right ventricle:  The cavity size was normal. Systolic function was normal.  ------------------------------------------------------------------- Ventricular septum:   Septal motion showed paradox. These changes are consistent with a post-thoracotomy state.  ------------------------------------------------------------------- Pulmonic valve:   Poorly visualized.  ------------------------------------------------------------------- Tricuspid valve:   Structurally normal valve.   Leaflet separation was normal.  Doppler:  Transvalvular velocity was within the normal range. There was trivial regurgitation.  ------------------------------------------------------------------- Pulmonary artery:   Systolic pressure was within  the normal range.     ------------------------------------------------------------------- Right atrium:  The atrium was normal in size.  ------------------------------------------------------------------- Pericardium:  There was no pericardial effusion.  ------------------------------------------------------------------- Systemic veins: Inferior vena cava: The vessel was normal in size. The respirophasic diameter changes were in the normal range (>= 50%), consistent with normal central venous pressure.  ------------------------------------------------------------------- Post procedure conclusions Ascending Aorta:  - The aorta was poorly visualized.  ------------------------------------------------------------------- Measurements   Left ventricle                           Value          Reference  LV ID, ED, PLAX chordal                  49.5  mm       43 - 52  LV ID, ES, PLAX chordal                  34.6  mm       23 - 38  LV fx shortening, PLAX chordal           30    %        >=29  LV PW thickness, ED                      8.46  mm       ----------  IVS/LV PW ratio, ED              (H)     1.44           <=1.3  Stroke volume, 2D                        53    ml       ----------  Stroke volume/bsa, 2D                    26    ml/m^2   ----------  LV ejection fraction, 1-p A4C            57    %        ----------  LV end-diastolic volume, 2-p             111   ml       ----------  LV end-systolic volume, 2-p              43    ml       ----------  LV ejection fraction, 2-p                61    %        ----------  Stroke volume, 2-p                       68    ml       ----------  LV end-diastolic volume/bsa, 2-p         54    ml/m^2   ----------  LV end-systolic volume/bsa, 2-p          21    ml/m^2   ----------  Stroke volume/bsa, 2-p                   33.2  ml/m^2   ----------  LV e&', lateral  7.94  cm/s     ----------  LV E/e&', lateral                          13.1           ----------  LV e&', medial                            5.55  cm/s     ----------  LV E/e&', medial                          18.74          ----------  LV e&', average                           6.75  cm/s     ----------  LV E/e&', average                         15.42          ----------    Ventricular septum                       Value          Reference  IVS thickness, ED                        12.2  mm       ----------    LVOT                                     Value          Reference  LVOT ID, S                               17    mm       ----------  LVOT area                                2.27  cm^2     ----------  LVOT peak velocity, S                    108   cm/s     ----------  LVOT mean velocity, S                    61.3  cm/s     ----------  LVOT VTI, S                              23.2  cm       ----------    Aortic valve                             Value          Reference  Aortic valve peak velocity, S            228   cm/s     ----------  Aortic valve  mean velocity, S            144   cm/s     ----------  Aortic valve VTI, S                      48.8  cm       ----------  Aortic mean gradient, S                  10    mm Hg    ----------  Aortic peak gradient, S                  21    mm Hg    ----------  VTI ratio, LVOT/AV                       0.48           ----------  Aortic valve area, VTI                   1.08  cm^2     ----------  Aortic valve area/bsa, VTI               0.53  cm^2/m^2 ----------  Velocity ratio, peak, LVOT/AV            0.47           ----------  Aortic valve area, peak velocity         1.08  cm^2     ----------  Aortic valve area/bsa, peak              0.53  cm^2/m^2 ----------  velocity  Velocity ratio, mean, LVOT/AV            0.43           ----------  Aortic valve area, mean velocity         0.97  cm^2     ----------  Aortic valve area/bsa, mean              0.47  cm^2/m^2 ----------  velocity    Aorta                                     Value          Reference  Aortic root ID, ED                       29    mm       ----------    Left atrium                              Value          Reference  LA ID, A-P, ES                           37    mm       ----------  LA ID/bsa, A-P                           1.81  cm/m^2   <=2.2  LA volume, S  56.8  ml       ----------  LA volume/bsa, S                         27.7  ml/m^2   ----------  LA volume, ES, 1-p A4C                   56.9  ml       ----------  LA volume/bsa, ES, 1-p A4C               27.8  ml/m^2   ----------  LA volume, ES, 1-p A2C                   53.2  ml       ----------  LA volume/bsa, ES, 1-p A2C               26    ml/m^2   ----------    Mitral valve                             Value          Reference  Mitral E-wave peak velocity              104   cm/s     ----------  Mitral A-wave peak velocity              92    cm/s     ----------  Mitral deceleration time         (H)     313   ms       150 - 230  Mitral peak gradient, D                  4     mm Hg    ----------  Mitral E/A ratio, peak                   1.1            ----------    Pulmonary arteries                       Value          Reference  PA pressure, S, DP                       26    mm Hg    <=30    Tricuspid valve                          Value          Reference  Tricuspid regurg peak velocity           239   cm/s     ----------  Tricuspid peak RV-RA gradient            23    mm Hg    ----------    Right atrium                             Value          Reference  RA ID, S-I, ES, A4C              (H)     62.5  mm       34 - 49  RA area, ES, A4C                         18.3  cm^2     8.3 - 19.5  RA volume, ES, A/L                       43.7  ml       ----------  RA volume/bsa, ES, A/L                   21.3  ml/m^2   ----------    Systemic veins                           Value          Reference  Estimated CVP                            3      mm Hg    ----------    Right ventricle                          Value          Reference  TAPSE                                    16.3  mm       ----------  RV pressure, S, DP                       26    mm Hg    <=30  RV s&', lateral, S                        10    cm/s     ----------  Legend: (L)  and  (H)  mark values outside specified reference range.  ------------------------------------------------------------------- Prepared and Electronically Authenticated by  Thurmon Fair, MD 2018-04-05T15:30:01   Impression:  Patient is doing very well more than 1 year status post minimally invasive aortic valve replacement using a bioprosthetic tissue valve.  Plan:  We have not recommended any changes the patient's current medications.  The patient has been reminded regarding the importance of dental hygiene and the lifelong need for antibiotic prophylaxis for all dental cleanings and other related invasive procedures.  All of his questions have been addressed.  In the future he will call and return to see [place] only should specific problems or questions arise.  I spent in excess of 15 minutes during the conduct of this office consultation and >50% of this time involved direct face-to-face encounter with the patient for counseling and/or coordination of their care.   Salvatore Decent. Cornelius Moras, MD 07/25/2017 4:26 PM

## 2017-07-28 DIAGNOSIS — Z113 Encounter for screening for infections with a predominantly sexual mode of transmission: Secondary | ICD-10-CM | POA: Diagnosis not present

## 2017-08-29 ENCOUNTER — Ambulatory Visit (HOSPITAL_COMMUNITY): Payer: BLUE CROSS/BLUE SHIELD | Attending: Cardiology

## 2017-08-29 ENCOUNTER — Other Ambulatory Visit: Payer: Self-pay

## 2017-08-29 DIAGNOSIS — Z953 Presence of xenogenic heart valve: Secondary | ICD-10-CM | POA: Insufficient documentation

## 2017-08-29 DIAGNOSIS — G459 Transient cerebral ischemic attack, unspecified: Secondary | ICD-10-CM | POA: Diagnosis not present

## 2017-09-20 ENCOUNTER — Ambulatory Visit: Payer: BLUE CROSS/BLUE SHIELD | Admitting: Cardiovascular Disease

## 2017-09-30 ENCOUNTER — Encounter: Payer: Self-pay | Admitting: Cardiovascular Disease

## 2017-09-30 ENCOUNTER — Ambulatory Visit: Payer: BLUE CROSS/BLUE SHIELD | Admitting: Cardiovascular Disease

## 2017-09-30 VITALS — BP 105/63 | HR 52 | Ht 71.0 in | Wt 202.2 lb

## 2017-09-30 DIAGNOSIS — Z953 Presence of xenogenic heart valve: Secondary | ICD-10-CM | POA: Diagnosis not present

## 2017-09-30 NOTE — Assessment & Plan Note (Signed)
History of critical aortic stenosis with minimal CAD status post aortic valve replacement by Dr. Cornelius Moras 07/14/2016 with a 23 mm Edwards Intuity rapid deployment pericardial tissue valve.  He is back working out with his trainer 3 times a week.  He has no limitation.  He feels the best that he felt in years 2D echo performed 08/29/2017 revealed normal LV function, mild concentric LVH, normally functioning bioprosthesis in aortic position with mild AI and a mean gradient of 14 mmHg.  I am going to get a 2D echo in a year and I will see him back in 12 months.

## 2017-09-30 NOTE — Patient Instructions (Signed)
Medication Instructions: Your physician recommends that you continue on your current medications as directed. Please refer to the Current Medication list given to you today.  Testing/Procedures: Your physician has requested that you have an echocardiogram in 1 year. Echocardiography is a painless test that uses sound waves to create images of your heart. It provides your doctor with information about the size and shape of your heart and how well your heart's chambers and valves are working. This procedure takes approximately one hour. There are no restrictions for this procedure.  Follow-Up: Your physician wants you to follow-up in: 1 year with Dr. Berry. You will receive a reminder letter in the mail two months in advance. If you don't receive a letter, please call our office to schedule the follow-up appointment.  If you need a refill on your cardiac medications before your next appointment, please call your pharmacy.  

## 2017-09-30 NOTE — Progress Notes (Signed)
09/30/2017 Jerry Odonnell   08/08/1955  [id number]  Primary Physician Loyal Jacobson, MD Primary Cardiologist: Runell Gess MD [name], [place]  HPI:  Jerry Odonnell is a 62 y.o.  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 He was referred for evaluation and treatment of severe aortic stenosis. I last saw him in the office 02/22/2017. He has no cardiac risk factors. He is active and walks 4-5 miles a day. PCP heard a murmur and followed up with a 2-D echocardiogram 04/28/16 that revealed normal LV size and function, moderate concentric LVH with severe aortic stenosis. The mean gradient was 86 mmof mercury. He has had one episode of dizziness back in August. He denies chest pain or shortness of breath. My intention was to perform elective right and left heart cath on him 2 weeks after his initial visit however 2 days later he had syncope and was brought to [facility]. He had a right left heart cath performed by Dr. Herbie Baltimore confirming critical aortic stenosis with minimal CAD. He was seen by Dr. Laneta Simmers and caused consultation the following day and several days later on 07/14/16 he underwent minimally invasive aortic valve replacement by Dr. Cornelius Moras with a 23 mm Edwards Intuity rapid deployment pericardial tissue valve. He had some chest wall bleeding requiring reexploration the same day and was discharged 5 days later. He did participate in the rehabilitation program at [facility]. He exercises 3 times a week with a personal trainer.  Since I saw him 6 months ago he is continued to do well.  He has no physical limitations.  He denies chest pain or shortness of breath.  He continues to work out frequently.  A 2D echo performed 08/29/2017 showed a well-functioning aortic bioprosthesis with a mean gradient of 14 and mild AI, normal LV function with  mild concentric LVH.    Current Meds  Medication Sig  . aspirin EC 81 MG tablet Take 81 mg by mouth daily.  [name] atorvastatin (LIPITOR) 10 MG tablet Take 10 mg by mouth daily.  [name] EPINEPHrine (EPIPEN 2-PAK) 0.3 mg/0.3 mL IJ SOAJ injection Inject 0.3 mLs into the muscle as directed.  . fluticasone (FLONASE) 50 MCG/ACT nasal spray Place 1 spray into the nose as directed.  . meclizine (ANTIVERT) 25 MG tablet Take 25 mg by mouth as directed.  . metoprolol tartrate (LOPRESSOR) 25 MG tablet Take 25 mg by mouth 2 (two) times daily.     Allergies  Allergen Reactions  . Bee Venom Swelling    SWELLING REACTION UNSPECIFIED     Social History   Socioeconomic History  . Marital status: Widowed    Spouse name: Not on file  . Number of children: Not on file  . Years of education: Not on file  . Highest education level: Not on file  Occupational History  . Occupation: R&D Event organiser: [employer]  Social Needs  . Financial resource strain: Not on file  . Food insecurity:    Worry: Not on file    Inability: Not on file  . Transportation needs:    Medical: Not on file    Non-medical: Not on file  Tobacco Use  . Smoking status: Never Smoker  . Smokeless tobacco: Never Used  Substance and Sexual Activity  . Alcohol use: Yes  Alcohol/week: 4.2 oz    Types: 7 Glasses of wine per week    Comment: approximately 1 drink per day  . Drug use: No  . Sexual activity: Not on file  Lifestyle  . Physical activity:    Days per week: Not on file    Minutes per session: Not on file  . Stress: Not on file  Relationships  . Social connections:    Talks on phone: Not on file    Gets together: Not on file    Attends religious service: Not on file    Active member of club or organization: Not on file    Attends meetings of clubs or organizations: Not on file    Relationship status: Not on file  . Intimate partner violence:    Fear of current or ex partner: Not on file    Emotionally  abused: Not on file    Physically abused: Not on file    Forced sexual activity: Not on file  Other Topics Concern  . Not on file  Social History Narrative  . Not on file     Review of Systems: General: negative for chills, fever, night sweats or weight changes.  Cardiovascular: negative for chest pain, dyspnea on exertion, edema, orthopnea, palpitations, paroxysmal nocturnal dyspnea or shortness of breath Dermatological: negative for rash Respiratory: negative for cough or wheezing Urologic: negative for hematuria Abdominal: negative for nausea, vomiting, diarrhea, bright red blood per rectum, melena, or hematemesis Neurologic: negative for visual changes, syncope, or dizziness All other systems reviewed and are otherwise negative except as noted above.    Blood pressure 105/63, pulse (!) 52, height  (1.803 m), weight 202 lb 3.2 oz (91.7 kg).  General appearance: alert and no distress Neck: no adenopathy, no JVD, supple, symmetrical, trachea midline, thyroid not enlarged, symmetric, no tenderness/mass/nodules and Soft bruits bilaterally versus transmitted murmur Lungs: clear to auscultation bilaterally Heart: Soft outflow tract murmur of aortic stenosis Extremities: extremities normal, atraumatic, no cyanosis or edema Pulses: 2+ and symmetric Skin: Skin color, texture, turgor normal. No rashes or lesions Neurologic: Alert and oriented X 3, normal strength and tone. Normal symmetric reflexes. Normal coordination and gait  EKG not performed today  ASSESSMENT AND PLAN:   Hyperlipidemia History of hyperlipidemia on statin therapy  S/P minimally invasive aortic valve replacement with bioprosthetic valve History of critical aortic stenosis with minimal CAD status post aortic valve replacement by Dr. Cornelius Moras 07/14/2016 with a 23 mm Edwards Intuity rapid deployment pericardial tissue valve.  He is back working out with his trainer 3 times a week.  He has no limitation.  He feels the  best that he felt in years 2D echo performed 08/29/2017 revealed normal LV function, mild concentric LVH, normally functioning bioprosthesis in aortic position with mild AI and a mean gradient of 14 mmHg.  I am going to get a 2D echo in a year and I will see him back in 12 months.      Runell Gess MD FACP,FACC,FAHA, [facility] 09/30/2017 9:22 AM

## 2017-09-30 NOTE — Assessment & Plan Note (Signed)
History of hyperlipidemia on statin therapy. 

## 2017-11-04 DIAGNOSIS — L309 Dermatitis, unspecified: Secondary | ICD-10-CM | POA: Diagnosis not present

## 2017-11-25 ENCOUNTER — Other Ambulatory Visit: Payer: Self-pay | Admitting: Cardiovascular Disease

## 2017-11-25 MED ORDER — ATORVASTATIN CALCIUM 10 MG PO TABS: 10.0000 mg | ORAL_TABLET | Freq: Every day | ORAL | 3 refills | Status: DC

## 2017-11-25 MED ORDER — METOPROLOL TARTRATE 25 MG PO TABS: 25.0000 mg | ORAL_TABLET | Freq: Two times a day (BID) | ORAL | 3 refills | Status: DC

## 2017-11-25 NOTE — Telephone Encounter (Signed)
°*  STAT* If patient is at the pharmacy, call can be transferred to refill team.   1. Which medications need to be refilled? (please list name of each medication and dose if known)0 Atorvastatin and Metoprolol  2. Which pharmacy/location (including street and city if local pharmacy) is medication to be sent to? [facility] [phone]  3. Do they need a 30 day or 90 day supply? 90 for Atorvastatin and 180 for Metoprolol and refills

## 2017-12-27 DIAGNOSIS — H00025 Hordeolum internum left lower eyelid: Secondary | ICD-10-CM | POA: Diagnosis not present

## 2018-01-18 DIAGNOSIS — H1131 Conjunctival hemorrhage, right eye: Secondary | ICD-10-CM | POA: Diagnosis not present

## 2018-01-26 DIAGNOSIS — L570 Actinic keratosis: Secondary | ICD-10-CM | POA: Diagnosis not present

## 2018-01-26 DIAGNOSIS — D225 Melanocytic nevi of trunk: Secondary | ICD-10-CM | POA: Diagnosis not present

## 2018-01-26 DIAGNOSIS — Z1283 Encounter for screening for malignant neoplasm of skin: Secondary | ICD-10-CM | POA: Diagnosis not present

## 2018-01-26 DIAGNOSIS — X32XXXD Exposure to sunlight, subsequent encounter: Secondary | ICD-10-CM | POA: Diagnosis not present

## 2018-02-17 DIAGNOSIS — L570 Actinic keratosis: Secondary | ICD-10-CM | POA: Diagnosis not present

## 2018-02-17 DIAGNOSIS — X32XXXD Exposure to sunlight, subsequent encounter: Secondary | ICD-10-CM | POA: Diagnosis not present

## 2018-05-05 ENCOUNTER — Other Ambulatory Visit: Payer: Self-pay | Admitting: Cardiovascular Disease

## 2018-05-05 NOTE — Telephone Encounter (Signed)
Rx request sent to pharmacy.  

## 2018-06-23 DIAGNOSIS — Z131 Encounter for screening for diabetes mellitus: Secondary | ICD-10-CM | POA: Diagnosis not present

## 2018-06-23 DIAGNOSIS — D696 Thrombocytopenia, unspecified: Secondary | ICD-10-CM | POA: Diagnosis not present

## 2018-06-23 DIAGNOSIS — E785 Hyperlipidemia, unspecified: Secondary | ICD-10-CM | POA: Diagnosis not present

## 2018-06-26 DIAGNOSIS — R7303 Prediabetes: Secondary | ICD-10-CM | POA: Diagnosis not present

## 2018-06-26 DIAGNOSIS — Z0000 Encounter for general adult medical examination without abnormal findings: Secondary | ICD-10-CM | POA: Diagnosis not present

## 2018-06-26 DIAGNOSIS — D696 Thrombocytopenia, unspecified: Secondary | ICD-10-CM | POA: Diagnosis not present

## 2018-06-26 DIAGNOSIS — E785 Hyperlipidemia, unspecified: Secondary | ICD-10-CM | POA: Diagnosis not present

## 2018-06-26 DIAGNOSIS — Z91038 Other insect allergy status: Secondary | ICD-10-CM | POA: Diagnosis not present

## 2018-08-24 ENCOUNTER — Other Ambulatory Visit (HOSPITAL_COMMUNITY): Payer: BLUE CROSS/BLUE SHIELD

## 2018-08-25 ENCOUNTER — Other Ambulatory Visit (HOSPITAL_COMMUNITY): Payer: BLUE CROSS/BLUE SHIELD

## 2018-09-20 IMAGING — US US EXTREM LOW VENOUS*R*
1 series · 14 of 24 positions shown · non-contrast
Comparison: None

CLINICAL DATA: Right leg pain and swelling x2 weeks

EXAM:
RIGHT LOWER EXTREMITY VENOUS DOPPLER ULTRASOUND
TECHNIQUE: Gray-scale sonography with compression, as well as color and duplex
ultrasound, were performed to evaluate the deep venous system from
the level of the common femoral vein through the popliteal and
proximal calf veins.

[Series 1: us extrem low venous*right* · 0.06mm/px · 14 of 34 slices shown]
[im 1/34]
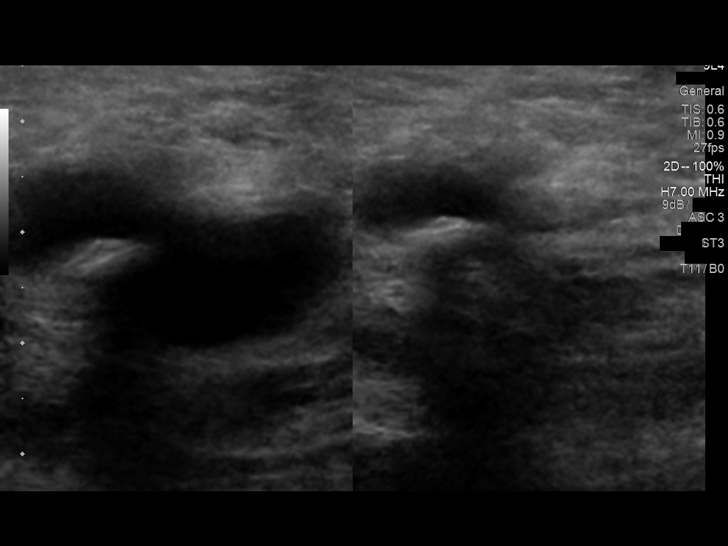
[im 3/34]
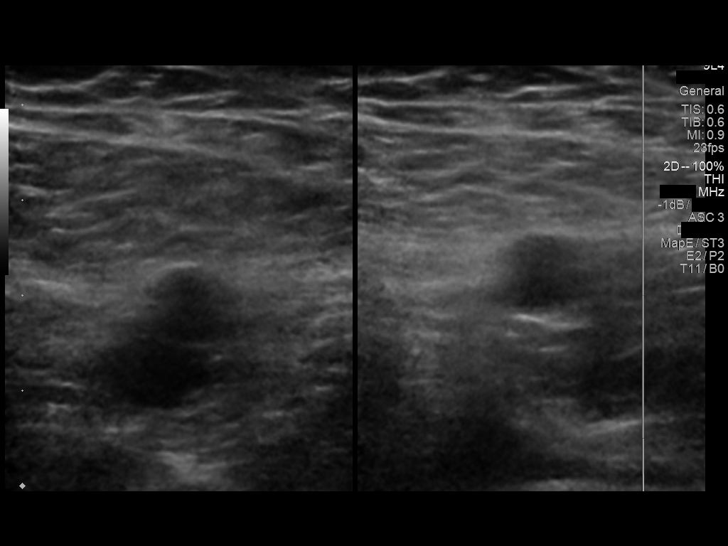
[im 6/34]
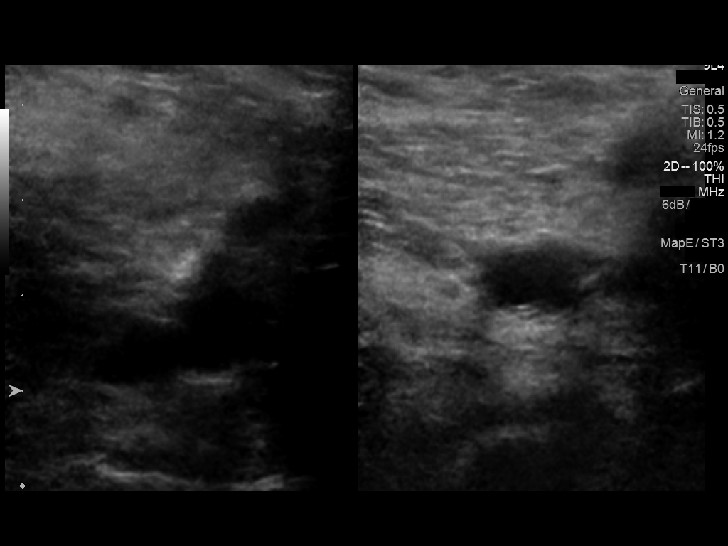
[im 9/34]
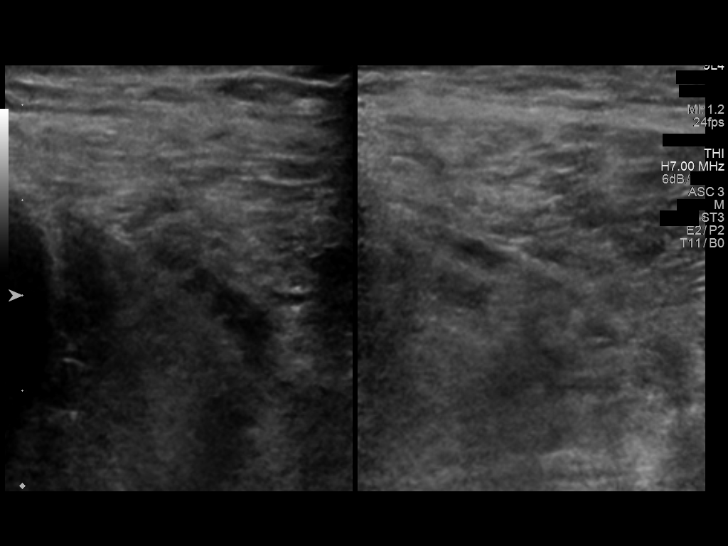
[im 11/34]
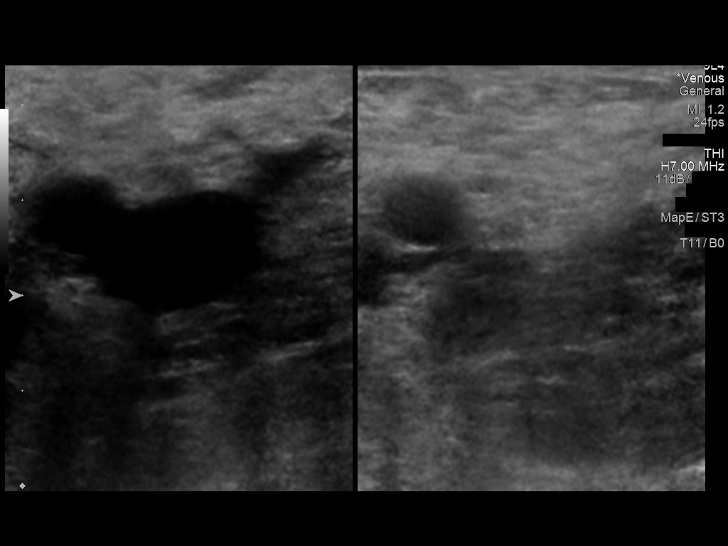
[im 13/34]
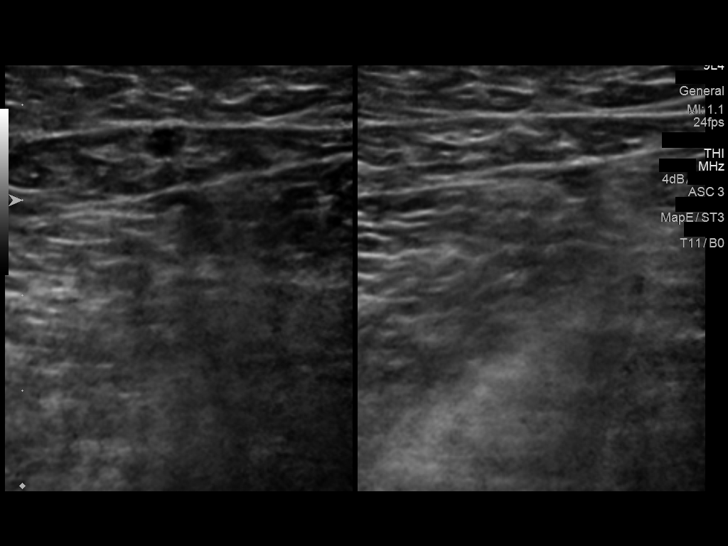
[im 16/34]
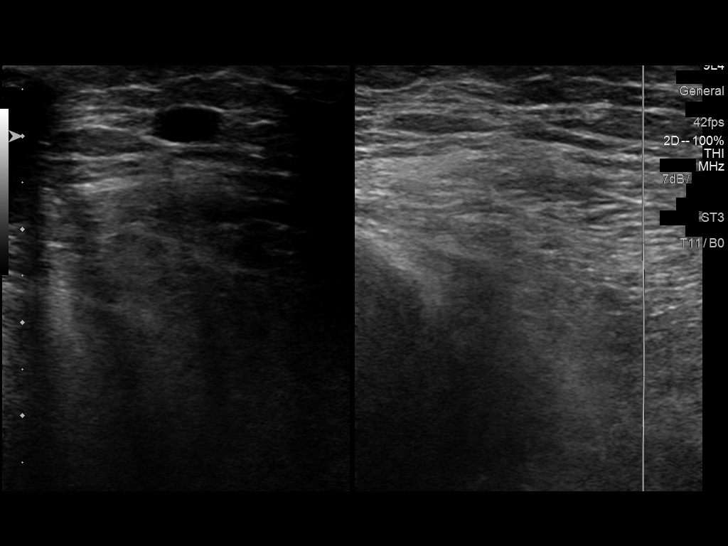
[im 18/34]
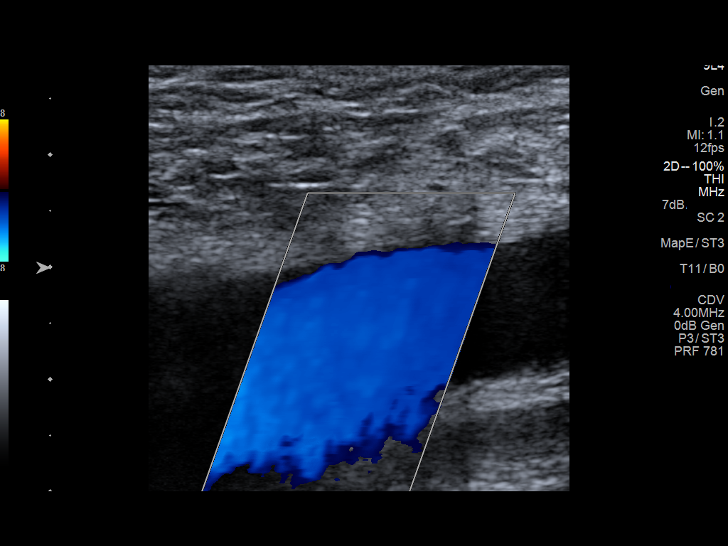
[im 21/34]
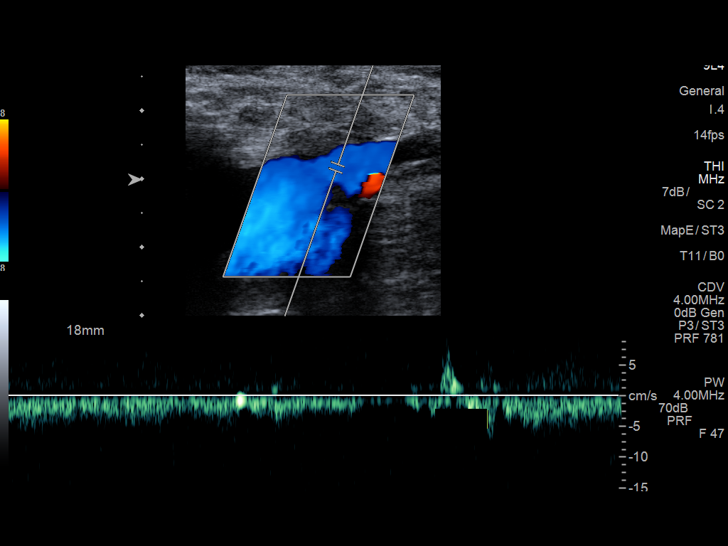
[im 23/34]
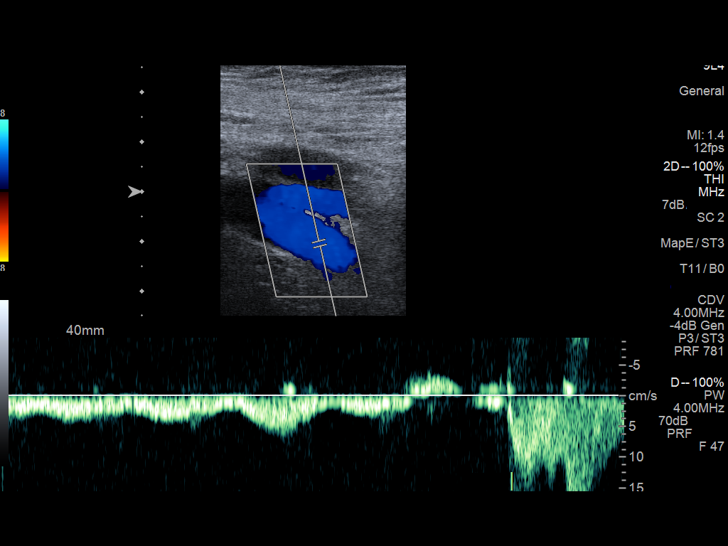
[im 26/34]
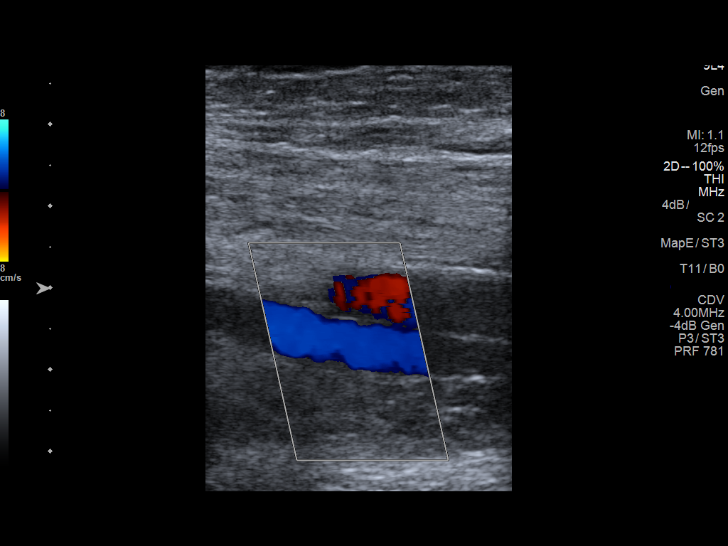
[im 28/34]
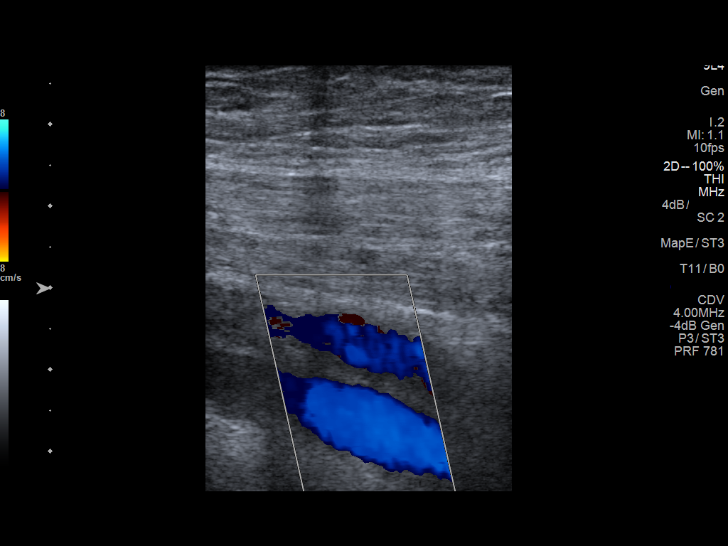
[im 31/34]
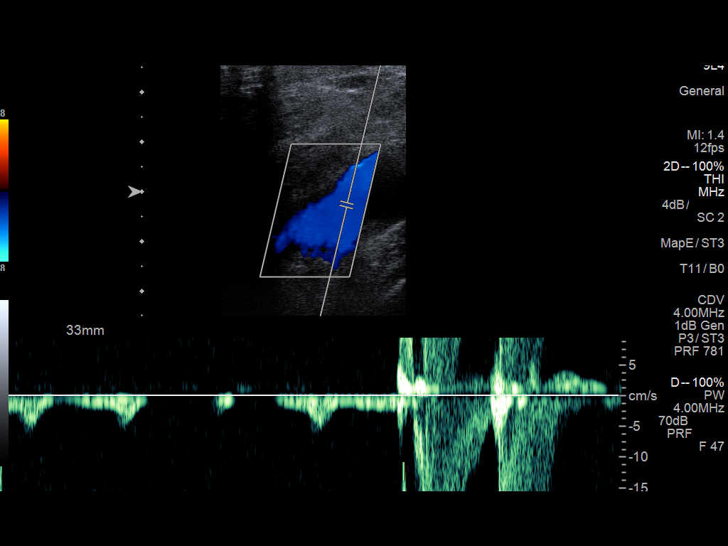
[im 34/34]
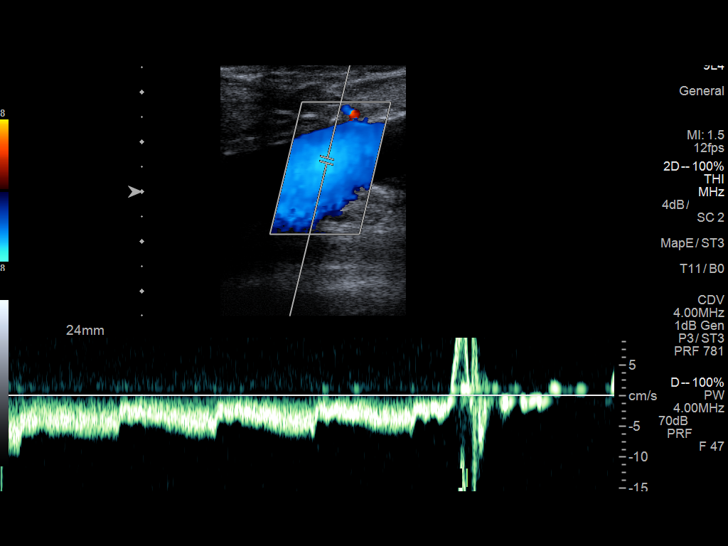

[14 of 24 positions shown; findings below may reference images not displayed]

FINDINGS: Normal compressibility of the common femoral, superficial femoral,
and popliteal veins, as well as the proximal calf veins. No filling
defects to suggest DVT on grayscale or color Doppler imaging.
Doppler waveforms show normal direction of venous flow, normal
respiratory phasicity and response to augmentation. Visualized
segments of the saphenous venous system normal in caliber and
compressibility. Survey views of the contralateral common femoral
vein are unremarkable.
IMPRESSION: No evidence of  lower extremity deep vein thrombosis, right.

## 2018-09-28 ENCOUNTER — Ambulatory Visit (HOSPITAL_COMMUNITY): Payer: Medicare Other | Attending: Cardiovascular Disease

## 2018-09-28 ENCOUNTER — Other Ambulatory Visit: Payer: Self-pay

## 2018-09-28 DIAGNOSIS — Z953 Presence of xenogenic heart valve: Secondary | ICD-10-CM | POA: Diagnosis not present

## 2018-10-03 ENCOUNTER — Encounter: Payer: Self-pay | Admitting: Cardiovascular Disease

## 2018-10-03 ENCOUNTER — Telehealth (INDEPENDENT_AMBULATORY_CARE_PROVIDER_SITE_OTHER): Payer: Medicare Other | Admitting: Cardiovascular Disease

## 2018-10-03 ENCOUNTER — Telehealth: Payer: Self-pay

## 2018-10-03 DIAGNOSIS — E782 Mixed hyperlipidemia: Secondary | ICD-10-CM

## 2018-10-03 DIAGNOSIS — Z953 Presence of xenogenic heart valve: Secondary | ICD-10-CM | POA: Diagnosis not present

## 2018-10-03 NOTE — Telephone Encounter (Signed)
Patient and/or DPR-approved person aware of AVS instructions and verbalized understanding. Letter including After Visit Summary and any other necessary documents to be mailed to the patient's address on file.  

## 2018-10-03 NOTE — Progress Notes (Signed)
Virtual Visit via Video Note   This visit type was conducted due to national recommendations for restrictions regarding the COVID-19 Pandemic (e.g. social distancing) in an effort to limit this patient's exposure and mitigate transmission in our community.  Due to his co-morbid illnesses, this patient is at least at moderate risk for complications without adequate follow up.  This format is felt to be most appropriate for this patient at this time.  All issues noted in this document were discussed and addressed.  A limited physical exam was performed with this format.  Please refer to the patient's chart for his consent to telehealth for [facility].   Date:  10/03/2018   ID:  Cindy Hazy Stickley, DOB 1955-09-02, MRN [id number]  Patient Location: Home Provider Location: Home  PCP:  Loyal Jacobson, MD  Cardiologist: Dr. Nanetta Batty Electrophysiologist:  None   Evaluation Performed:  Follow-Up Visit  Chief Complaint: Follow-up AVR  History of Present Illness:    Jerry Odonnell is a 63 y.o.  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 I last saw him in the office  09/30/2017.He has nocardiacrisk factors. He is active and walks 4-5 miles a day. PCP heard a murmur and followed up with a 2-D echocardiogram 04/28/16 that revealed normal LV size and function, moderate concentric LVH with severe aortic stenosis. The mean gradient was 86 mmof mercury. He has had one episode of dizziness back in August. He denies chest pain or shortness of breath.My intention was to perform elective right and left heart cath on him 2 weeks after his initial visit however 2 days later he had syncope  and was brought to [facility]. He had a right left heart cath performed by Dr. Herbie Baltimore confirming critical aortic stenosis with minimal CAD. He was seen by Dr.Bartleand caused consultation the following day and several days later on 07/14/16 he underwent minimally invasive aortic valve replacement by Dr.Owenwith a 23 mm Edwards Intuity rapid deployment pericardial tissue valve. He had some chest wall bleeding requiring reexploration the same day and was discharged 5 days later. He did participate in the rehabilitation program at [facility].  Since I saw him a year ago he continues to do well.  He still works out with a Psychologist, educational 3 days a week and does cardio on his own walking, biking and hiking without limitation.  Recent echo performed 09/28/2018 revealed normal LV function with a normal functioning aortic bioprosthesis with a mean gradient of 16 mmHg.  The patient does not have symptoms concerning for COVID-19 infection (fever, chills, cough, or new shortness of breath).    Past Medical History:  Diagnosis Date  . Aortic stenosis   . Hyperlipidemia   . Personal history of TIA (transient ischemic attack)    Admission in 2017 to [facility] with reported small lesion seen on imaging  . S/P minimally invasive aortic valve replacement with bioprosthetic valve 07/14/2016   23 mm Edwards Intuity Elite rapid deployment bioprosthetic tissue valve via right mini thoracotomy approach  . Syncope and collapse 07/09/2016  . Thrombocytopenia (HCC)    Past Surgical History:  Procedure Laterality Date  . AORTIC VALVE REPLACEMENT N/A 07/14/2016  Procedure: MINIMALLY INVASIVE AORTIC VALVE REPLACEMENT (AVR);  Surgeon: Purcell Nails, MD;  Location: [facility] OR;  Service: Open Heart Surgery;  Laterality: N/A;  . CHEST EXPLORATION Right 07/14/2016   Procedure: REEXPLORATION OF RIGHT THORACOTOMY FOR BLEEDING;  Surgeon: Purcell Nails, MD;  Location: [facility] OR;  Service: Open Heart Surgery;   Laterality: Right;  . CHEST TUBE INSERTION N/A 07/16/2016   Procedure: CHEST TUBE REMOVAL, right chest;  Surgeon: Purcell Nails, MD;  Location: [facility] OR;  Service: Thoracic;  Laterality: N/A;  . POSTERIOR CERVICAL LAMINECTOMY  2013  . RIGHT/LEFT HEART CATH AND CORONARY ANGIOGRAPHY N/A 07/09/2016   Procedure: Right/Left Heart Cath and Coronary Angiography;  Surgeon: Marykay Lex, MD;  Location: [facility] INVASIVE CV LAB;  Service: Cardiovascular;  Laterality: N/A;  . TEE WITHOUT CARDIOVERSION N/A 07/14/2016   Procedure: TRANSESOPHAGEAL ECHOCARDIOGRAM (TEE);  Surgeon: Purcell Nails, MD;  Location: [facility] OR;  Service: Open Heart Surgery;  Laterality: N/A;  . TEE WITHOUT CARDIOVERSION N/A 07/14/2016   Procedure: TRANSESOPHAGEAL ECHOCARDIOGRAM (TEE);  Surgeon: Purcell Nails, MD;  Location: [facility] OR;  Service: Open Heart Surgery;  Laterality: N/A;     Current Meds  Medication Sig  . aspirin EC 81 MG tablet Take 81 mg by mouth daily.  [name] atorvastatin (LIPITOR) 10 MG tablet Take 1 tablet (10 mg total) by mouth daily.  [name] EPINEPHrine (EPIPEN 2-PAK) 0.3 mg/0.3 mL IJ SOAJ injection Inject 0.3 mLs into the muscle as directed.  . meclizine (ANTIVERT) 25 MG tablet Take 25 mg by mouth as directed.  . metoprolol tartrate (LOPRESSOR) 25 MG tablet TAKE 1 TABLET BY MOUTH TWICE A DAY  . [DISCONTINUED] fluticasone (FLONASE) 50 MCG/ACT nasal spray Place 1 spray into the nose as directed.     Allergies:   Bee venom   Social History   Tobacco Use  . Smoking status: Never Smoker  . Smokeless tobacco: Never Used  Substance Use Topics  . Alcohol use: Yes    Alcohol/week: 7.0 standard drinks    Types: 7 Glasses of wine per week    Comment: approximately 1 drink per day  . Drug use: No     Family Hx: The patient's family history includes Healthy in his brother.  ROS:   Please see the history of present illness.     All other systems reviewed and are negative.   Prior CV studies:   The following studies were  reviewed today:  2D echocardiogram performed 09/28/2018  Labs/Other Tests and Data Reviewed:    EKG:  No ECG reviewed.  Recent Labs: No results found for requested labs within last 8760 hours.   Recent Lipid Panel No results found for: CHOL, TRIG, HDL, CHOLHDL, LDLCALC, LDLDIRECT  Wt Readings from Last 3 Encounters:  10/03/18 196 lb (88.9 kg)  09/30/17 202 lb 3.2 oz (91.7 kg)  07/25/17 201 lb (91.2 kg)     Objective:    Vital Signs:  Ht 6' (1.829 m)   Wt 196 lb (88.9 kg)   BMI 26.58 kg/m    VITAL SIGNS:  reviewed GEN:  no acute distress RESPIRATORY:  normal respiratory effort, symmetric expansion NEURO:  alert and oriented x 3, no obvious focal deficit PSYCH:  normal affect  ASSESSMENT & PLAN:    1. Severe aortic stenosis- history of severe aortic stenosis with minimal CAD by cath performed by Dr. Herbie Baltimore and echo.  He is status post minimally invasive AVR by Dr. Cornelius Moras 07/14/2016 with a 23 mm Edwards pericardial  tissue valve.  His last echo performed 09/28/2018 revealed normal LV function with a well-functioning aortic bioprosthesis that had a mean gradient of 16 mmHg. 2. Hyperlipidemia- history of hyperlipidemia on low-dose statin therapy with lipid profile performed by his PCP 06/23/2018 revealing total cholesterol 180, LDL of 92 and HDL of 78 3. Essential hypertension- history of essential hypertension blood pressure measured in the 130/80 range on metoprolol  COVID-19 Education: The signs and symptoms of COVID-19 were discussed with the patient and how to seek care for testing (follow up with PCP or arrange E-visit).  The importance of social distancing was discussed today.  Time:   Today, I have spent 13 minutes with the patient with telehealth technology discussing the above problems.     Medication Adjustments/Labs and Tests Ordered: Current medicines are reviewed at length with the patient today.  Concerns regarding medicines are outlined above.   Tests Ordered: No  orders of the defined types were placed in this encounter.   Medication Changes: No orders of the defined types were placed in this encounter.   Disposition:  Follow up in 1 year(s)  Signed, Nanetta BattyJonathan , MD  10/03/2018 3:23 PM    [facility]

## 2018-10-03 NOTE — Patient Instructions (Addendum)
Medication Instructions:  Your physician recommends that you continue on your current medications as directed. Please refer to the Current Medication list given to you today.  If you need a refill on your cardiac medications before your next appointment, please call your pharmacy.   Lab work: NONE If you have labs (blood work) drawn today and your tests are completely normal, you will receive your results only by: [name] MyChart Message (if you have MyChart) OR . A paper copy in the mail If you have any lab test that is abnormal or we need to change your treatment, we will call you to review the results.  Testing/Procedures: Your physician has requested that you have an echocardiogram. Echocardiography is a painless test that uses sound waves to create images of your heart. It provides your doctor with information about the size and shape of your heart and how well your heart's chambers and valves are working. This procedure takes approximately one hour. There are no restrictions for this procedure. LOCATION: [address], [place], [place] [address]  YOU WILL BE CONTACTED BY OUR OFFICE TO SET UP AN APPOINTMENT FOR APPROXIMATELY 1 YEAR FROM TODAY'S APPOINTMENT   Follow-Up: At [facility], you and your health needs are our priority.  As part of our continuing mission to provide you with exceptional heart care, we have created designated Provider Care Teams.  These Care Teams include your primary Cardiologist (physician) and Advanced Practice Providers (APPs -  Physician Assistants and Nurse Practitioners) who all work together to provide you with the care you need, when you need it. You will need a follow up appointment in 12 months WITH DR. Allyson Sabal.  Please call our office 2 months in advance to schedule this appointment.

## 2018-10-10 ENCOUNTER — Other Ambulatory Visit: Payer: Self-pay

## 2018-10-10 DIAGNOSIS — Z953 Presence of xenogenic heart valve: Secondary | ICD-10-CM

## 2018-10-28 ENCOUNTER — Other Ambulatory Visit: Payer: Self-pay | Admitting: Cardiovascular Disease

## 2018-11-06 ENCOUNTER — Other Ambulatory Visit: Payer: Self-pay | Admitting: Cardiovascular Disease

## 2019-02-09 ENCOUNTER — Telehealth: Payer: Self-pay

## 2019-02-09 NOTE — Telephone Encounter (Signed)
Contacted pt regarding the following 9/14 mychart message:  "Hi. I take metoprolol and atorvastatin. I have an essential tremor.  I'm told that Some beta blockers may reduce essential tremor and others have less impact. My neurologist suggested I discuss with you if a different beta blocker - which has more impact - might be possible?  I'd be interested to discuss this with Dr Gwenlyn Found in a telemedicine call."   Pt states he is okay with OV. Pt scheduled for OV on 9/24 at 3:15pm. He verbalized understanding

## 2019-02-15 ENCOUNTER — Encounter: Payer: Self-pay | Admitting: Cardiovascular Disease

## 2019-02-15 ENCOUNTER — Other Ambulatory Visit: Payer: Self-pay

## 2019-02-15 ENCOUNTER — Ambulatory Visit (INDEPENDENT_AMBULATORY_CARE_PROVIDER_SITE_OTHER): Payer: Medicare Other | Admitting: Cardiovascular Disease

## 2019-02-15 DIAGNOSIS — Z953 Presence of xenogenic heart valve: Secondary | ICD-10-CM | POA: Diagnosis not present

## 2019-02-15 DIAGNOSIS — E782 Mixed hyperlipidemia: Secondary | ICD-10-CM | POA: Diagnosis not present

## 2019-02-15 NOTE — Patient Instructions (Addendum)
Medication Instructions:  Your physician recommends that you continue on your current medications as directed. Please refer to the Current Medication list given to you today.  If you need a refill on your cardiac medications before your next appointment, please call your pharmacy.   Lab work: none If you have labs (blood work) drawn today and your tests are completely normal, you will receive your results only by: [name] MyChart Message (if you have MyChart) OR . A paper copy in the mail If you have any lab test that is abnormal or we need to change your treatment, we will call you to review the results.  Testing/Procedures: Your physician has requested that you have an echocardiogram. Echocardiography is a painless test that uses sound waves to create images of your heart. It provides your doctor with information about the size and shape of your heart and how well your heart's chambers and valves are working. This procedure takes approximately one hour. There are no restrictions for this procedure. LOCATION: [facility] at [facility]: [place], [place], [place] [address] DUE MAY 2021   Follow-Up: At [facility], you and your health needs are our priority.  As part of our continuing mission to provide you with exceptional heart care, we have created designated Provider Care Teams.  These Care Teams include your primary Cardiologist (physician) and Advanced Practice Providers (APPs -  Physician Assistants and Nurse Practitioners) who all work together to provide you with the care you need, when you need it. You will need a follow up appointment in 12 months with Dr. Quay Burow.  Please call our office 2 months in advance to schedule this appointment.

## 2019-02-15 NOTE — Assessment & Plan Note (Signed)
History of minimally invasive aortic valve replacement by Dr. Roxy Manns 07/14/2016 2023 mm Oletta Lamas Intuity rapid deployment pericardial tissue valve.  His most recent 2D echocardiogram performed 09/28/2018 revealed normal LV function with a normal Lee functioning aortic bioprosthesis.  This will be repeated on an annual basis.

## 2019-02-15 NOTE — Assessment & Plan Note (Signed)
History of hyperlipidemia on low-dose statin therapy with recent lipid profile performed by his PCP 06/23/2018 revealing total cholesterol 180, LDL 92 and HDL 78.

## 2019-02-15 NOTE — Progress Notes (Signed)
02/15/2019 Jerry Odonnell   1955/07/26  [id number]  Primary Physician Loyal Jacobson, MD Primary Cardiologist: Runell Gess MD [name], [place]  HPI:  Jerry Odonnell is a 63 y.o.  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 I last saw him  virtually 10/03/2018.He has nocardiacrisk factors. He is active and walks 4-5 miles a day. PCP heard a murmur and followed up with a 2-D echocardiogram 04/28/16 that revealed normal LV size and function, moderate concentric LVH with severe aortic stenosis. The mean gradient was 86 mmof mercury. He has had one episode of dizziness back in August. He denies chest pain or shortness of breath.My intention was to perform elective right and left heart cath on him 2 weeks after his initial visit however 2 days later he had syncope and was brought to [facility]. He had a right left heart cath performed by Dr. Herbie Baltimore confirming critical aortic stenosis with minimal CAD. He was seen by Dr.Bartleand caused consultation the following day and several days later on 07/14/16 he underwent minimally invasive aortic valve replacement by Dr.Owenwith a 23 mm Edwards Intuity rapid deployment pericardial tissue valve. He had some chest wall bleeding requiring reexploration the same day and was discharged 5 days later. He did participate in the rehabilitation program at [facility].  Since I saw him virtually 4 months ago, he still works out with a trainer 3 days a week and does cardio on his own walking, biking and hiking without limitation.  Recent echo performed 09/28/2018 revealed normal LV function with a normal functioning  aortic bioprosthesis with a mean gradient of 16 mmHg. lipid profile performed 06/23/2018 was appropriate for primary prevention since his coronary arteries were normal.  He has developed an benign essential intention tremor and has seen a neurologist, Dr. Maple Hudson who suggested the possibility of using propranolol.  I have no problem interchanging the metoprolol for propranolol.    Current Meds  Medication Sig  . aspirin EC 81 MG tablet Take 81 mg by mouth daily.  [name] atorvastatin (LIPITOR) 10 MG tablet TAKE 1 TABLET BY MOUTH EVERY DAY  . EPINEPHrine (EPIPEN 2-PAK) 0.3 mg/0.3 mL IJ SOAJ injection Inject 0.3 mLs into the muscle as directed.  . meclizine (ANTIVERT) 25 MG tablet Take 25 mg by mouth as directed.  . metoprolol tartrate (LOPRESSOR) 25 MG tablet TAKE 1 TABLET BY MOUTH TWICE A DAY     Allergies  Allergen Reactions  . Bee Venom Swelling    SWELLING REACTION UNSPECIFIED     Social History   Socioeconomic History  . Marital status: Widowed    Spouse name: Not on file  . Number of children: Not on file  . Years of education: Not on file  . Highest education level: Not on file  Occupational History  . Occupation: R&D Event organiser: [employer]  Social Needs  . Financial resource strain: Not on file  . Food insecurity    Worry: Not on file    Inability: Not on file  . Transportation needs    Medical: Not on file    Non-medical: Not on file  Tobacco Use  .  Smoking status: Never Smoker  . Smokeless tobacco: Never Used  Substance and Sexual Activity  . Alcohol use: Yes    Alcohol/week: 7.0 standard drinks    Types: 7 Glasses of wine per week    Comment: approximately 1 drink per day  . Drug use: No  . Sexual activity: Not on file  Lifestyle  . Physical activity    Days per week: Not on file    Minutes per session: Not on file  . Stress: Not on file  Relationships  . Social Herbalist on phone: Not on file    Gets together: Not on file    Attends  religious service: Not on file    Active member of club or organization: Not on file    Attends meetings of clubs or organizations: Not on file    Relationship status: Not on file  . Intimate partner violence    Fear of current or ex partner: Not on file    Emotionally abused: Not on file    Physically abused: Not on file    Forced sexual activity: Not on file  Other Topics Concern  . Not on file  Social History Narrative  . Not on file     Review of Systems: General: negative for chills, fever, night sweats or weight changes.  Cardiovascular: negative for chest pain, dyspnea on exertion, edema, orthopnea, palpitations, paroxysmal nocturnal dyspnea or shortness of breath Dermatological: negative for rash Respiratory: negative for cough or wheezing Urologic: negative for hematuria Abdominal: negative for nausea, vomiting, diarrhea, bright red blood per rectum, melena, or hematemesis Neurologic: negative for visual changes, syncope, or dizziness All other systems reviewed and are otherwise negative except as noted above.    Blood pressure 136/72, pulse (!) 57, height 5\' 11"  (1.803 m), weight 202 lb (91.6 kg), SpO2 98 %.  General appearance: alert and no distress Neck: no adenopathy, no JVD, supple, symmetrical, trachea midline, thyroid not enlarged, symmetric, no tenderness/mass/nodules and Bilateral carotid bruits versus transmitted murmur Lungs: clear to auscultation bilaterally Heart: 2/6 outflow tract murmur consistent with aortic stenosis Extremities: extremities normal, atraumatic, no cyanosis or edema Pulses: 2+ and symmetric Skin: Skin color, texture, turgor normal. No rashes or lesions Neurologic: Alert and oriented X 3, normal strength and tone. Normal symmetric reflexes. Normal coordination and gait  EKG sinus bradycardia 57 with left axis deviation and left ventricular hypertrophy with QRS widening unchanged from his prior EKG.  ASSESSMENT AND PLAN:   Hyperlipidemia  History of hyperlipidemia on low-dose statin therapy with recent lipid profile performed by his PCP 06/23/2018 revealing total cholesterol 180, LDL 92 and HDL 78.  S/P minimally invasive aortic valve replacement with bioprosthetic valve History of minimally invasive aortic valve replacement by Dr. Roxy Manns 07/14/2016 2023 mm Oletta Lamas Intuity rapid deployment pericardial tissue valve.  His most recent 2D echocardiogram performed 09/28/2018 revealed normal LV function with a normal Lee functioning aortic bioprosthesis.  This will be repeated on an annual basis.      Lorretta Harp MD FACP,FACC,FAHA, [facility] 02/15/2019 3:42 PM

## 2019-05-10 ENCOUNTER — Other Ambulatory Visit: Payer: Self-pay | Admitting: Cardiovascular Disease

## 2019-06-12 ENCOUNTER — Telehealth: Payer: Self-pay

## 2019-06-12 NOTE — Telephone Encounter (Signed)
Pt called regarding MyChart message sent on 1/13. Let him know Dr. Allyson Sabal says there is no concern with him getting the Covid vaccination with his heart valve and does not think he should stop taking aspirin to donate blood. Pt verbalized understanding.

## 2019-06-20 ENCOUNTER — Ambulatory Visit: Payer: Medicare Other

## 2019-06-29 ENCOUNTER — Ambulatory Visit: Payer: Medicare Other

## 2019-07-07 ENCOUNTER — Ambulatory Visit: Payer: Medicare Other

## 2019-09-24 ENCOUNTER — Ambulatory Visit (HOSPITAL_COMMUNITY): Payer: Medicare Other | Attending: Cardiology

## 2019-09-24 ENCOUNTER — Other Ambulatory Visit: Payer: Self-pay

## 2019-09-24 DIAGNOSIS — Z953 Presence of xenogenic heart valve: Secondary | ICD-10-CM | POA: Diagnosis present

## 2019-10-02 ENCOUNTER — Encounter: Payer: Self-pay | Admitting: Cardiovascular Disease

## 2019-10-02 ENCOUNTER — Ambulatory Visit (INDEPENDENT_AMBULATORY_CARE_PROVIDER_SITE_OTHER): Payer: Medicare Other | Admitting: Cardiovascular Disease

## 2019-10-02 ENCOUNTER — Other Ambulatory Visit: Payer: Self-pay

## 2019-10-02 VITALS — BP 128/74 | HR 50 | Temp 97.9°F | Ht 71.0 in | Wt 201.8 lb

## 2019-10-02 DIAGNOSIS — I10 Essential (primary) hypertension: Secondary | ICD-10-CM | POA: Diagnosis not present

## 2019-10-02 DIAGNOSIS — E782 Mixed hyperlipidemia: Secondary | ICD-10-CM

## 2019-10-02 DIAGNOSIS — Z953 Presence of xenogenic heart valve: Secondary | ICD-10-CM

## 2019-10-02 MED ORDER — ATORVASTATIN CALCIUM 20 MG PO TABS: 20.0000 mg | ORAL_TABLET | Freq: Every day | ORAL | 3 refills | Status: DC

## 2019-10-02 NOTE — Assessment & Plan Note (Signed)
History of hyperlipidemia on low-dose statin therapy followed by his PCP. ?

## 2019-10-02 NOTE — Addendum Note (Signed)
Addended by: Freddi Starr on: 10/02/2019 12:03 PM   Modules accepted: Orders

## 2019-10-02 NOTE — Patient Instructions (Addendum)
Medication Instructions:  INCREASE ATORVASTATIN TO 20 MG ONCE DAILY= 2 OF THE 10 MG TABLETS ONCE DAILY  *If you need a refill on your cardiac medications before your next appointment, please call your pharmacy*   Lab Work: If you have labs (blood work) drawn today and your tests are completely normal, you will receive your results only by: [name] MyChart Message (if you have MyChart) OR . A paper copy in the mail If you have any lab test that is abnormal or we need to change your treatment, we will call you to review the results.   Testing/Procedures: Your physician has requested that you have an echocardiogram. Echocardiography is a painless test that uses sound waves to create images of your heart. It provides your doctor with information about the size and shape of your heart and how well your heart's chambers and valves are working. This procedure takes approximately one hour. There are no restrictions for this procedure.SCHEDULE IN ONE YEAR     Follow-Up: At [facility], you and your health needs are our priority.  As part of our continuing mission to provide you with exceptional heart care, we have created designated Provider Care Teams.  These Care Teams include your primary Cardiologist (physician) and Advanced Practice Providers (APPs -  Physician Assistants and Nurse Practitioners) who all work together to provide you with the care you need, when you need it.  We recommend signing up for the patient portal called "MyChart".  Sign up information is provided on this After Visit Summary.  MyChart is used to connect with patients for Virtual Visits (Telemedicine).  Patients are able to view lab/test results, encounter notes, upcoming appointments, etc.  Non-urgent messages can be sent to your provider as well.   To learn more about what you can do with MyChart, go to [email or web address].    Your next appointment:   12 month(s)  The format for your next appointment:   Either In  Person or Virtual  Provider:   You may see Nanetta Batty MD or one of the following Advanced Practice Providers on your designated Care Team:    Corine Shelter, PA-C  [place], [place]  Edd Fabian, [place]

## 2019-10-02 NOTE — Progress Notes (Signed)
10/02/2019 Jerry Odonnell   1956/01/01  [id number]  Primary Physician Loyal Jacobson, MD Primary Cardiologist: Runell Gess MD [name], [place]  HPI:  Jerry Odonnell is a 64 y.o.    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 I last saw him  in the office 02/15/2019.Jerry KitchenHe has nocardiacrisk factors. He is active and walks 4-5 miles a day. PCP heard a murmur and followed up with a 2-D echocardiogram 04/28/16 that revealed normal LV size and function, moderate concentric LVH with severe aortic stenosis. The mean gradient was 86 mmof mercury. He has had one episode of dizziness back in August. He denies chest pain or shortness of breath.My intention was to perform elective right and left heart cath on him 2 weeks after his initial visit however 2 days later he had syncope and was brought to [facility]. He had a right left heart cath performed by Dr. Herbie Baltimore confirming critical aortic stenosis with minimal CAD. He was seen by Dr.Bartleand caused consultation the following day and several days later on 07/14/16 he underwent minimally invasive aortic valve replacement by Dr.Owenwith a 23 mm Edwards Intuity rapid deployment pericardial tissue valve. He had some chest wall bleeding requiring reexploration the same day and was discharged 5 days later. He did participate in the rehabilitation program at [facility].  He works out with a trainer 3 days a week and does cardio on his own walking, biking and hiking without limitation. Recent echo performed 09/24/2019 revealed normal LV function with a normal functioning aortic bioprosthesis with a mean  gradient of . lipid profile performed 03/02/2019 revealed total cholesterol 170, LDL of 87 and HDL 78 on 10 mg of atorvastatin.  He has developed an benign essential tremor and is seeing a neurologist for this.  Current Meds  Medication Sig  . amoxicillin (AMOXIL) 500 MG capsule TAKE 4 CAPSULES 1 HOUR PRIOR TO DENTAL APPOINTMENT  . aspirin EC 81 MG tablet Take 81 mg by mouth daily.  Jerry Odonnell atorvastatin (LIPITOR) 10 MG tablet TAKE 1 TABLET BY MOUTH EVERY DAY  . candesartan (ATACAND) 8 MG tablet SMARTSIG:1 Tablet(s) By Mouth Every Evening  . EPINEPHrine (EPIPEN 2-PAK) 0.3 mg/0.3 mL IJ SOAJ injection Inject 0.3 mLs into the muscle as directed.  . meclizine (ANTIVERT) 25 MG tablet Take 25 mg by mouth as directed.  . metoprolol tartrate (LOPRESSOR) 25 MG tablet TAKE 1 TABLET BY MOUTH TWICE A DAY     Allergies  Allergen Reactions  . Bee Venom Swelling    SWELLING REACTION UNSPECIFIED     Social History   Socioeconomic History  . Marital status: Widowed    Spouse name: Not on file  . Number of children: Not on file  . Years of education: Not on file  . Highest education level: Not on file  Occupational History  . Occupation: R&D Event organiser: [employer]  Tobacco Use  . Smoking status: Never Smoker  . Smokeless tobacco: Never Used  Substance and Sexual Activity  . Alcohol use: Yes    Alcohol/week: 7.0 standard drinks    Types: 7 Glasses of wine per week    Comment: approximately 1 drink per  day  . Drug use: No  . Sexual activity: Not on file  Other Topics Concern  . Not on file  Social History Narrative  . Not on file   Social Determinants of Health   Financial Resource Strain:   . Difficulty of Paying Living Expenses:   Food Insecurity:   . Worried About Charity fundraiser in the Last Year:   . Arboriculturist in the Last Year:   Transportation Needs:   . Film/video editor (Medical):   Jerry Odonnell Lack of Transportation (Non-Medical):   Physical Activity:   . Days  of Exercise per Week:   . Minutes of Exercise per Session:   Stress:   . Feeling of Stress :   Social Connections:   . Frequency of Communication with Friends and Family:   . Frequency of Social Gatherings with Friends and Family:   . Attends Religious Services:   . Active Member of Clubs or Organizations:   . Attends Archivist Meetings:   Jerry Odonnell Marital Status:   Intimate Partner Violence:   . Fear of Current or Ex-Partner:   . Emotionally Abused:   Jerry Odonnell Physically Abused:   . Sexually Abused:      Review of Systems: General: negative for chills, fever, night sweats or weight changes.  Cardiovascular: negative for chest pain, dyspnea on exertion, edema, orthopnea, palpitations, paroxysmal nocturnal dyspnea or shortness of breath Dermatological: negative for rash Respiratory: negative for cough or wheezing Urologic: negative for hematuria Abdominal: negative for nausea, vomiting, diarrhea, bright red blood per rectum, melena, or hematemesis Neurologic: negative for visual changes, syncope, or dizziness All other systems reviewed and are otherwise negative except as noted above.    Blood pressure 128/74, pulse (!) 50, temperature 97.9 F (36.6 C), height 5\' 11"  (1.803 m), weight 201 lb 12.8 oz (91.5 kg), SpO2 99 %.  General appearance: alert and no distress Neck: no adenopathy, no carotid bruit, no JVD, supple, symmetrical, trachea midline and thyroid not enlarged, symmetric, no tenderness/mass/nodules Lungs: clear to auscultation bilaterally Heart: Regular rate and rhythm with a soft outflow tract murmur Extremities: extremities normal, atraumatic, no cyanosis or edema Pulses: 2+ and symmetric Skin: Skin color, texture, turgor normal. No rashes or lesions Neurologic: Alert and oriented X 3, normal strength and tone. Normal symmetric reflexes. Normal coordination and gait  EKG sinus bradycardia 50 with left ventricular hypertrophy and QRS widening, left axis deviation.  I  personally reviewed this EKG.  ASSESSMENT AND PLAN:   Hyperlipidemia History of hyperlipidemia on low-dose statin therapy followed by his PCP.  Essential hypertension History of essential hypertension recently started on Atacand by his PCP with a blood pressure measured today at 128/74.  S/P minimally invasive aortic valve replacement with bioprosthetic valve History of minimally invasive aortic valve replacement for critical aortic stenosis by Dr. Roxy Manns 05/13/2017 with a 23 mm Edwards Intuity rapid deployment pericardial tissue valve.  Intracardiac catheterization prior to that by Dr. Ellyn Hack revealing minimal CAD.  He is done well since.  He is very active and works out with a Clinical research associate several days a week as well as doing other exercise without symptoms.  Recent 2D echocardiogram performed 09/24/2019 revealed normal LV function with a well-functioning aortic bioprosthesis, trivial AI with a mean gradient of 16 mmHg.      Lorretta Harp MD FACP,FACC,FAHA, [facility] 10/02/2019 11:56 AM

## 2019-10-02 NOTE — Assessment & Plan Note (Signed)
History of essential hypertension recently started on Atacand by his PCP with a blood pressure measured today at 128/74.

## 2019-10-02 NOTE — Assessment & Plan Note (Signed)
History of minimally invasive aortic valve replacement for critical aortic stenosis by Dr. Cornelius Moras 05/13/2017 with a 23 mm Edwards Intuity rapid deployment pericardial tissue valve.  Intracardiac catheterization prior to that by Dr. Herbie Baltimore revealing minimal CAD.  He is done well since.  He is very active and works out with a Psychologist, educational several days a week as well as doing other exercise without symptoms.  Recent 2D echocardiogram performed 09/24/2019 revealed normal LV function with a well-functioning aortic bioprosthesis, trivial AI with a mean gradient of 16 mmHg.

## 2019-10-03 ENCOUNTER — Telehealth: Payer: Self-pay | Admitting: Cardiovascular Disease

## 2019-10-03 NOTE — Telephone Encounter (Signed)
Was informed on 10/02/2019 that patient needed to be call. Called patient on 10/03/2019 @ 8:45am to see which appointment slot worked better for patient, no response so voicemail was left for patient to return call

## 2019-10-29 ENCOUNTER — Other Ambulatory Visit: Payer: Self-pay | Admitting: Cardiovascular Disease

## 2019-11-04 ENCOUNTER — Other Ambulatory Visit: Payer: Self-pay | Admitting: Cardiovascular Disease

## 2020-01-21 ENCOUNTER — Other Ambulatory Visit: Payer: Self-pay

## 2020-01-21 MED ORDER — METOPROLOL TARTRATE 25 MG PO TABS: 25.0000 mg | ORAL_TABLET | Freq: Two times a day (BID) | ORAL | 2 refills | Status: DC

## 2020-09-22 ENCOUNTER — Other Ambulatory Visit (HOSPITAL_COMMUNITY): Payer: Medicare Other

## 2020-09-25 ENCOUNTER — Other Ambulatory Visit: Payer: Self-pay

## 2020-09-25 ENCOUNTER — Ambulatory Visit (HOSPITAL_COMMUNITY): Payer: Medicare HMO | Attending: Cardiovascular Disease

## 2020-09-25 DIAGNOSIS — Z953 Presence of xenogenic heart valve: Secondary | ICD-10-CM | POA: Diagnosis present

## 2020-09-25 LAB — ECHOCARDIOGRAM COMPLETE
AV Mean grad: 10 mmHg
AV Peak grad: 19.2 mmHg
Ao pk vel: 2.19 m/s
Area-P 1/2: 3.34 cm2
S' Lateral: 3.3 cm

## 2020-10-01 ENCOUNTER — Ambulatory Visit: Payer: Medicare HMO | Admitting: Cardiovascular Disease

## 2020-10-01 ENCOUNTER — Other Ambulatory Visit: Payer: Self-pay

## 2020-10-01 ENCOUNTER — Encounter: Payer: Self-pay | Admitting: Cardiovascular Disease

## 2020-10-01 VITALS — BP 114/62 | HR 48 | Ht 71.0 in | Wt 202.0 lb

## 2020-10-01 DIAGNOSIS — I10 Essential (primary) hypertension: Secondary | ICD-10-CM | POA: Diagnosis not present

## 2020-10-01 DIAGNOSIS — Z953 Presence of xenogenic heart valve: Secondary | ICD-10-CM | POA: Diagnosis not present

## 2020-10-01 DIAGNOSIS — E782 Mixed hyperlipidemia: Secondary | ICD-10-CM | POA: Diagnosis not present

## 2020-10-01 NOTE — Progress Notes (Signed)
10/01/2020 Jerry Odonnell   May 25, 1955  [id number]  Primary Physician Windy Canny, MD Primary Cardiologist: Runell Gess MD [name], [place]  HPI:  Jerry Odonnell is a 65 y.o.    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 I last saw him in the office 10/02/2019.Marland KitchenHe has nocardiacrisk factors. He is active and walks 4-5 miles a day. PCP heard a murmur and followed up with a 2-D echocardiogram 04/28/16 that revealed normal LV size and function, moderate concentric LVH with severe aortic stenosis. The mean gradient was 86 mmof mercury. He has had one episode of dizziness back in August. He denies chest pain or shortness of breath.My intention was to perform elective right and left heart cath on him 2 weeks after his initial visit however 2 days later he had syncope and was brought to [facility]. He had a right left heart cath performed by Dr. Herbie Baltimore confirming critical aortic stenosis with minimal CAD. He was seen by Dr.Bartleand caused consultation the following day and several days later on 07/14/16 he underwent minimally invasive aortic valve replacement by Dr.Owenwith a 23 mm Edwards Intuity rapid deployment pericardial tissue valve. He had some chest wall bleeding requiring reexploration the same day and was discharged 5 days later. He did participate in the rehabilitation program at [facility].  He works out with a trainer 3 days a week at fitness together and does cardio on his own walking, biking and hiking without limitation. Recent echo performed  09/25/2020 revealed normal LV function with a normal functioning aortic  bioprosthesis with a mean gradient of 10 mmHg.lipid profile performed  03/03/2020 revealed total cholesterol 161, LDL 69 and HDL of 67 on 20 mm of atorvastatin.    Current Meds  Medication Sig  . amoxicillin (AMOXIL) 500 MG capsule TAKE 4 CAPSULES 1 HOUR PRIOR TO DENTAL APPOINTMENT  . aspirin EC 81 MG tablet Take 81 mg by mouth daily.  [name] atorvastatin (LIPITOR) 20 MG tablet Take 1 tablet (20 mg total) by mouth daily.  . candesartan (ATACAND) 32 MG tablet Take 32 mg by mouth daily.  [name] EPINEPHrine 0.3 mg/0.3 mL IJ SOAJ injection Inject 0.3 mLs into the muscle as directed.  . metoprolol tartrate (LOPRESSOR) 25 MG tablet Take 1 tablet (25 mg total) by mouth 2 (two) times daily.  . [DISCONTINUED] meclizine (ANTIVERT) 25 MG tablet Take 25 mg by mouth as directed.     Allergies  Allergen Reactions  . Bee Venom Swelling    SWELLING REACTION UNSPECIFIED     Social History   Socioeconomic History  . Marital status: Widowed    Spouse name: Not on file  . Number of children: Not on file  . Years of education: Not on file  . Highest education level: Not on file  Occupational History  . Occupation: R&D Event organiser: [employer]  Tobacco Use  . Smoking status: Never Smoker  . Smokeless tobacco: Never Used  Substance and Sexual Activity  . Alcohol use: Yes    Alcohol/week: 7.0 standard drinks    Types: 7 Glasses of wine per week    Comment: approximately 1 drink per day  .  Drug use: No  . Sexual activity: Not on file  Other Topics Concern  . Not on file  Social History Narrative  . Not on file   Social Determinants of Health   Financial Resource Strain: Not on file  Food Insecurity: Not on file  Transportation Needs: Not on file  Physical Activity: Not on file  Stress: Not on file  Social Connections: Not on file  Intimate Partner Violence: Not on file     Review of Systems: General: negative for chills, fever, night sweats or weight changes.  Cardiovascular: negative  for chest pain, dyspnea on exertion, edema, orthopnea, palpitations, paroxysmal nocturnal dyspnea or shortness of breath Dermatological: negative for rash Respiratory: negative for cough or wheezing Urologic: negative for hematuria Abdominal: negative for nausea, vomiting, diarrhea, bright red blood per rectum, melena, or hematemesis Neurologic: negative for visual changes, syncope, or dizziness All other systems reviewed and are otherwise negative except as noted above.    Blood pressure 114/62, pulse (!) 48, height 5\' 11"  (1.803 m), weight 202 lb (91.6 kg).  General appearance: alert and no distress Neck: no adenopathy, no carotid bruit, no JVD, supple, symmetrical, trachea midline and thyroid not enlarged, symmetric, no tenderness/mass/nodules Lungs: clear to auscultation bilaterally Heart: regular rate and rhythm, S1, S2 normal, no murmur, click, rub or gallop Extremities: extremities normal, atraumatic, no cyanosis or edema Pulses: 2+ and symmetric Skin: Skin color, texture, turgor normal. No rashes or lesions Neurologic: Alert and oriented X 3, normal strength and tone. Normal symmetric reflexes. Normal coordination and gait  EKG sinus bradycardia 48 with a nonspecific IVCD.  I personally reviewed this EKG.  ASSESSMENT AND PLAN:   Hyperlipidemia History of hyperlipidemia on atorvastatin 20 mg a day followed by his PCP.  His recent cholesterol performed 03/03/2020 revealed cholesterol 161, LDL 69 and HDL 76.  S/P minimally invasive aortic valve replacement with bioprosthetic valve History of minimally invasive aortic valve replacement by Dr. 05/03/2020 05/13/2017 with a 23 mm Edwards Intuity rapid deployment pericardial tissue valve.  He did well afterwards.  He is very active and exercises without limitation.  Recent 2D echo performed 09/25/2020 revealed normal LV systolic function with a well-functioning aortic bioprosthesis.  This will be repeated on an annual basis.  Essential  hypertension History of essential hypertension with blood pressure measured today 114/62.  He is on metoprolol and candesartan which was recently increased by his PCP to 32 mg.      11/25/2020 MD [facility], [facility] 10/01/2020 10:57 AM

## 2020-10-01 NOTE — Assessment & Plan Note (Signed)
History of essential hypertension with blood pressure measured today 114/62.  He is on metoprolol and candesartan which was recently increased by his PCP to 32 mg.

## 2020-10-01 NOTE — Assessment & Plan Note (Addendum)
History of hyperlipidemia on atorvastatin 20 mg a day followed by his PCP.  His recent cholesterol performed 03/03/2020 revealed cholesterol 161, LDL 69 and HDL 76.

## 2020-10-01 NOTE — Patient Instructions (Signed)
Medication Instructions:  Your physician recommends that you continue on your current medications as directed. Please refer to the Current Medication list given to you today.  *If you need a refill on your cardiac medications before your next appointment, please call your pharmacy*   Testing/Procedures: Your physician has requested that you have an echocardiogram. Echocardiography is a painless test that uses sound waves to create images of your heart. It provides your doctor with information about the size and shape of your heart and how well your heart's chambers and valves are working. This procedure takes approximately one hour. There are no restrictions for this procedure. This procedure is done at [address]. To be done in May 2023.    Follow-Up: At [facility], you and your health needs are our priority.  As part of our continuing mission to provide you with exceptional heart care, we have created designated Provider Care Teams.  These Care Teams include your primary Cardiologist (physician) and Advanced Practice Providers (APPs -  Physician Assistants and Nurse Practitioners) who all work together to provide you with the care you need, when you need it.  We recommend signing up for the patient portal called "MyChart".  Sign up information is provided on this After Visit Summary.  MyChart is used to connect with patients for Virtual Visits (Telemedicine).  Patients are able to view lab/test results, encounter notes, upcoming appointments, etc.  Non-urgent messages can be sent to your provider as well.   To learn more about what you can do with MyChart, go to [email or web address].    Your next appointment:   12 month(s)  The format for your next appointment:   In Person  Provider:   Jonathan Berry, MD 

## 2020-10-01 NOTE — Assessment & Plan Note (Addendum)
History of minimally invasive aortic valve replacement by Dr. Cornelius Moras 05/13/2017 with a 23 mm Edwards Intuity rapid deployment pericardial tissue valve.  He did well afterwards.  He is very active and exercises without limitation.  Recent 2D echo performed 09/25/2020 revealed normal LV systolic function with a well-functioning aortic bioprosthesis.  This will be repeated on an annual basis.

## 2020-10-01 NOTE — Addendum Note (Signed)
Addended by: Bernita Buffy on: 10/01/2020 11:11 AM   Modules accepted: Orders

## 2020-10-20 ENCOUNTER — Other Ambulatory Visit: Payer: Self-pay | Admitting: Cardiovascular Disease

## 2020-10-20 DIAGNOSIS — E782 Mixed hyperlipidemia: Secondary | ICD-10-CM

## 2020-10-21 DIAGNOSIS — E782 Mixed hyperlipidemia: Secondary | ICD-10-CM

## 2020-10-21 MED ORDER — ATORVASTATIN CALCIUM 20 MG PO TABS: 20.0000 mg | ORAL_TABLET | Freq: Every day | ORAL | 3 refills | Status: AC

## 2021-04-07 ENCOUNTER — Emergency Department
Admission: RE | Admit: 2021-04-07 | Discharge: 2021-04-07 | Disposition: A | Discharge status: Home / Self Care | Payer: Medicare HMO | Source: Ambulatory Visit

## 2021-04-07 ENCOUNTER — Other Ambulatory Visit: Payer: Self-pay

## 2021-04-07 VITALS — BP 131/69 | HR 50 | Temp 97.6°F | Resp 20 | Ht 71.0 in | Wt 203.0 lb

## 2021-04-07 DIAGNOSIS — J0100 Acute maxillary sinusitis, unspecified: Secondary | ICD-10-CM | POA: Diagnosis not present

## 2021-04-07 DIAGNOSIS — J3489 Other specified disorders of nose and nasal sinuses: Secondary | ICD-10-CM

## 2021-04-07 DIAGNOSIS — J309 Allergic rhinitis, unspecified: Secondary | ICD-10-CM | POA: Diagnosis not present

## 2021-04-07 MED ORDER — FEXOFENADINE HCL 180 MG PO TABS: 180.0000 mg | ORAL_TABLET | Freq: Every day | ORAL | 0 refills | Status: DC

## 2021-04-07 MED ORDER — PREDNISONE 20 MG PO TABS: ORAL_TABLET | ORAL | 0 refills | Status: DC

## 2021-04-07 MED ORDER — CEFDINIR 300 MG PO CAPS: 300.0000 mg | ORAL_CAPSULE | Freq: Two times a day (BID) | ORAL | 0 refills | Status: AC

## 2021-04-07 NOTE — ED Provider Notes (Signed)
[facility]    CSN: [id number] Arrival date & time: 04/07/21  1134      History   Chief Complaint Chief Complaint  Patient presents with   Cough   Sore Throat    HPI Jerry Odonnell is a 65 y.o. male.   HPI 65 year old male presents with sore throat, nasal congestion, and cough for 6 days.  Patient reports negative home COVID-19 test.  PMH significant for aortic stenosis and TIA.  Past Medical History:  Diagnosis Date   Aortic stenosis    Hyperlipidemia    Personal history of TIA (transient ischemic attack)    Admission in 2017 to [facility] with reported small lesion seen on imaging   S/P minimally invasive aortic valve replacement with bioprosthetic valve 07/14/2016   23 mm Edwards Intuity Elite rapid deployment bioprosthetic tissue valve via right mini thoracotomy approach   Syncope and collapse 07/09/2016   Thrombocytopenia [facility])     Patient Active Problem List   Diagnosis Date Noted   Essential hypertension 10/02/2019   Cough 08/18/2016   S/P minimally invasive aortic valve replacement with bioprosthetic valve 07/14/2016   Syncope and collapse 07/09/2016   Hyperlipidemia 07/09/2016   Thrombocytopenia (HCC) 07/09/2016    Past Surgical History:  Procedure Laterality Date   AORTIC VALVE REPLACEMENT N/A 07/14/2016   Procedure: MINIMALLY INVASIVE AORTIC VALVE REPLACEMENT (AVR);  Surgeon: Purcell Nails, MD;  Location: [facility] OR;  Service: Open Heart Surgery;  Laterality: N/A;   CHEST EXPLORATION Right 07/14/2016   Procedure: REEXPLORATION OF RIGHT THORACOTOMY FOR BLEEDING;  Surgeon: Purcell Nails, MD;  Location: [facility] OR;  Service: Open Heart Surgery;  Laterality: Right;   CHEST TUBE INSERTION N/A 07/16/2016   Procedure: CHEST TUBE REMOVAL, right chest;  Surgeon: Purcell Nails, MD;  Location: [facility] OR;  Service: Thoracic;  Laterality: N/A;   POSTERIOR CERVICAL LAMINECTOMY  2013   RIGHT/LEFT HEART CATH AND CORONARY ANGIOGRAPHY N/A 07/09/2016   Procedure:  Right/Left Heart Cath and Coronary Angiography;  Surgeon: Marykay Lex, MD;  Location: [facility] INVASIVE CV LAB;  Service: Cardiovascular;  Laterality: N/A;   TEE WITHOUT CARDIOVERSION N/A 07/14/2016   Procedure: TRANSESOPHAGEAL ECHOCARDIOGRAM (TEE);  Surgeon: Purcell Nails, MD;  Location[facility] OR;  Service: Open Heart Surgery;  Laterality: N/A;   TEE WITHOUT CARDIOVERSION N/A 07/14/2016   Procedure: TRANSESOPHAGEAL ECHOCARDIOGRAM (TEE);  Surgeon: Purcell Nails, MD;  Location: [facility] OR;  Service: Open Heart Surgery;  Laterality: N/A;       Home Medications    Prior to Admission medications   Medication Sig Start Date End Date Taking? Authorizing Provider  cefdinir (OMNICEF) 300 MG capsule Take 1 capsule (300 mg total) by mouth 2 (two) times daily for 7 days. 04/07/21 04/14/21 Yes Trevor Iha, FNP  Chlorphen-PE-Acetaminophen (CORICIDIN D COLD/FLU/SINUS PO) Take by mouth.   Yes [provider]  fexofenadine (ALLEGRA ALLERGY) 180 MG tablet Take 1 tablet (180 mg total) by mouth daily for 15 days. 04/07/21 04/22/21 Yes Trevor Iha, FNP  predniSONE (DELTASONE) 20 MG tablet Take 3 tabs PO daily x 5 days. 04/07/21  Yes Trevor Iha, FNP  amoxicillin (AMOXIL) 500 MG capsule TAKE 4 CAPSULES 1 HOUR PRIOR TO DENTAL APPOINTMENT 08/13/19   [provider]  aspirin EC 81 MG tablet Take 81 mg by mouth daily. 01/17/16   [provider]  atorvastatin (LIPITOR) 20 MG tablet Take 1 tablet (20 mg total) by mouth daily. 10/21/20   Runell Gess, MD  candesartan (  ATACAND) 32 MG tablet Take 32 mg by mouth daily.    [provider]  EPINEPHrine 0.3 mg/0.3 mL IJ SOAJ injection Inject 0.3 mLs into the muscle as directed. 03/23/16   [provider]  metoprolol tartrate (LOPRESSOR) 25 MG tablet Take 1 tablet (25 mg total) by mouth 2 (two) times daily. 01/21/20   Runell Gess, MD    Family History Family History  Problem Relation Age of Onset   Lupus Father     Healthy Brother     Social History Social History   Tobacco Use   Smoking status: Never   Smokeless tobacco: Never  Vaping Use   Vaping Use: Never used  Substance Use Topics   Alcohol use: Yes    Comment: approximately 1 drink per day   Drug use: No     Allergies   Bee venom   Review of Systems Review of Systems  HENT:  Positive for congestion and sore throat.   Respiratory:  Positive for cough.     Physical Exam Triage Vital Signs ED Triage Vitals  Enc Vitals Group     BP --      Pulse --      Resp --      Temp --      Temp src --      SpO2 --      Weight 04/07/21 1147 203 lb (92.1 kg)     Height 04/07/21 1147 5\' 11"  (1.803 m)     Head Circumference --      Peak Flow --      Pain Score 04/07/21 1145 3     Pain Loc --      Pain Edu? --      Excl. in GC? --    No data found.  Updated Vital Signs BP 131/69 (BP Location: Right Arm)   Pulse (!) 50   Temp 97.6 F (36.4 C) (Oral)   Resp 20   Ht 5\' 11"  (1.803 m)   Wt 203 lb (92.1 kg)   SpO2 99%   BMI 28.31 kg/m   Physical Exam Vitals and nursing note reviewed.  Constitutional:      General: He is not in acute distress.    Appearance: Normal appearance. He is normal weight. He is not ill-appearing.  HENT:     Head: Normocephalic and atraumatic.     Right Ear: Tympanic membrane and external ear normal.     Left Ear: Tympanic membrane and external ear normal.     Ears:     Comments: Moderate eustachian tube dysfunction noted bilaterally    Mouth/Throat:     Mouth: Mucous membranes are moist.     Pharynx: Oropharynx is clear.     Comments: Moderate amount of clear drainage of posterior oropharynx noted Eyes:     Extraocular Movements: Extraocular movements intact.     Conjunctiva/sclera: Conjunctivae normal.     Pupils: Pupils are equal, round, and reactive to light.  Cardiovascular:     Rate and Rhythm: Normal rate and regular rhythm.     Pulses: Normal pulses.     Heart sounds: Normal heart  sounds.  Pulmonary:     Effort: Pulmonary effort is normal.     Breath sounds: Normal breath sounds.  Musculoskeletal:        General: Normal range of motion.     Cervical back: Normal range of motion and neck supple.  Skin:    General: Skin is warm and  dry.  Neurological:     General: No focal deficit present.     Mental Status: He is alert and oriented to person, place, and time. Mental status is at baseline.     UC Treatments / Results  Labs (all labs ordered are listed, but only abnormal results are displayed) Labs Reviewed - No data to display  EKG   Radiology No results found.  Procedures Procedures (including critical care time)  Medications Ordered in UC Medications - No data to display  Initial Impression / Assessment and Plan / UC Course  I have reviewed the triage vital signs and the nursing notes.  Pertinent labs & imaging results that were available during my care of the patient were reviewed by me and considered in my medical decision making (see chart for details).     MDM: 1.  Acute maxillary sinusitis-Rx'd Cefdinir; 2.  Sinus pressure-Rx Prednisone burst; 3.  Allergic rhinitis-Rx'd Allegra. Advised patient to take medication as directed with food to completion.  Advised patient to take Prednisone burst and Allegra with first dose of antibiotic for the next 5 of 7 days.  May use Allegra afterwards for concurrent postnasal drainage/drip as needed.  Encouraged patient to increase daily water intake while taking these medications.  Patient discharged home, hemodynamically stable. Final Clinical Impressions(s) / UC Diagnoses   Final diagnoses:  Acute maxillary sinusitis, recurrence not specified  Sinus pressure  Allergic rhinitis, unspecified seasonality, unspecified trigger     Discharge Instructions      Advised patient to take medication as directed with food to completion.  Advised patient to take Prednisone burst and Allegra with first dose of  antibiotic for the next 5 of 7 days.  May use Allegra afterwards for concurrent postnasal drainage/drip as needed.  Encouraged patient to increase daily water intake while taking these medications.     ED Prescriptions     Medication Sig Dispense Auth. Provider   cefdinir (OMNICEF) 300 MG capsule Take 1 capsule (300 mg total) by mouth 2 (two) times daily for 7 days. 14 capsule Trevor Iha, FNP   predniSONE (DELTASONE) 20 MG tablet Take 3 tabs PO daily x 5 days. 15 tablet Trevor Iha, FNP   fexofenadine [facility] ALLERGY) 180 MG tablet Take 1 tablet (180 mg total) by mouth daily for 15 days. 15 tablet Trevor Iha, FNP      PDMP not reviewed this encounter.   Trevor Iha, FNP 04/07/21 1229

## 2021-04-07 NOTE — Discharge Instructions (Addendum)
Advised patient to take medication as directed with food to completion.  Advised patient to take Prednisone burst and Allegra with first dose of antibiotic for the next 5 of 7 days.  May use Allegra afterwards for concurrent postnasal drainage/drip as needed.  Encouraged patient to increase daily water intake while taking these medications.

## 2021-04-07 NOTE — ED Triage Notes (Signed)
Pt presents to Urgent Care with c/o sore throat, nasal congestion, and cough x 6 days. Reports negative home COVID tests. Afebrile.

## 2021-04-10 ENCOUNTER — Emergency Department
Admission: RE | Admit: 2021-04-10 | Discharge: 2021-04-10 | Disposition: A | Discharge status: Home / Self Care | Payer: Medicare HMO | Source: Ambulatory Visit

## 2021-04-10 ENCOUNTER — Other Ambulatory Visit: Payer: Self-pay

## 2021-04-10 VITALS — BP 131/76 | HR 64 | Temp 97.9°F | Resp 18

## 2021-04-10 DIAGNOSIS — R059 Cough, unspecified: Secondary | ICD-10-CM | POA: Diagnosis not present

## 2021-04-10 NOTE — ED Provider Notes (Signed)
[facility]    CSN: [id number] Arrival date & time: 04/10/21  1549      History   Chief Complaint Chief Complaint  Patient presents with   Cough    Pt c/o cough, seen 11/15 not improving     HPI Rudra Hobbins is a 65 y.o. male.   HPI 65 year old male presents with cough.  Patient evaluated by me on 04/07/2021 found to have acute maxillary sinusitis and prescribed cefdinir, prednisone, and Allegra.  Past Medical History:  Diagnosis Date   Aortic stenosis    Hyperlipidemia    Personal history of TIA (transient ischemic attack)    Admission in 2017 to [facility] with reported small lesion seen on imaging   S/P minimally invasive aortic valve replacement with bioprosthetic valve 07/14/2016   23 mm Edwards Intuity Elite rapid deployment bioprosthetic tissue valve via right mini thoracotomy approach   Syncope and collapse 07/09/2016   Thrombocytopenia [facility])     Patient Active Problem List   Diagnosis Date Noted   Essential hypertension 10/02/2019   Cough 08/18/2016   S/P minimally invasive aortic valve replacement with bioprosthetic valve 07/14/2016   Syncope and collapse 07/09/2016   Hyperlipidemia 07/09/2016   Thrombocytopenia (HCC) 07/09/2016    Past Surgical History:  Procedure Laterality Date   AORTIC VALVE REPLACEMENT N/A 07/14/2016   Procedure: MINIMALLY INVASIVE AORTIC VALVE REPLACEMENT (AVR);  Surgeon: Purcell Nails, MD;  Location: [facility] OR;  Service: Open Heart Surgery;  Laterality: N/A;   CHEST EXPLORATION Right 07/14/2016   Procedure: REEXPLORATION OF RIGHT THORACOTOMY FOR BLEEDING;  Surgeon: Purcell Nails, MD;  Location: [facility] OR;  Service: Open Heart Surgery;  Laterality: Right;   CHEST TUBE INSERTION N/A 07/16/2016   Procedure: CHEST TUBE REMOVAL, right chest;  Surgeon: Purcell Nails, MD;  Location: [facility] OR;  Service: Thoracic;  Laterality: N/A;   POSTERIOR CERVICAL LAMINECTOMY  2013   RIGHT/LEFT HEART CATH AND CORONARY ANGIOGRAPHY N/A  07/09/2016   Procedure: Right/Left Heart Cath and Coronary Angiography;  Surgeon: Marykay Lex, MD;  Location: [facility] INVASIVE CV LAB;  Service: Cardiovascular;  Laterality: N/A;   TEE WITHOUT CARDIOVERSION N/A 07/14/2016   Procedure: TRANSESOPHAGEAL ECHOCARDIOGRAM (TEE);  Surgeon: Purcell Nails, MD;  Location: [facility] OR;  Service: Open Heart Surgery;  Laterality: N/A;   TEE WITHOUT CARDIOVERSION N/A 07/14/2016   Procedure: TRANSESOPHAGEAL ECHOCARDIOGRAM (TEE);  Surgeon: Purcell Nails, MD;  Location: [facility] OR;  Service: Open Heart Surgery;  Laterality: N/A;       Home Medications    Prior to Admission medications   Medication Sig Start Date End Date Taking? Authorizing Provider  amoxicillin (AMOXIL) 500 MG capsule TAKE 4 CAPSULES 1 HOUR PRIOR TO DENTAL APPOINTMENT 08/13/19   [provider]  aspirin EC 81 MG tablet Take 81 mg by mouth daily. 01/17/16   [provider]  atorvastatin (LIPITOR) 20 MG tablet Take 1 tablet (20 mg total) by mouth daily. 10/21/20   Runell Gess, MD  candesartan (ATACAND) 32 MG tablet Take 32 mg by mouth daily.    [provider]  cefdinir (OMNICEF) 300 MG capsule Take 1 capsule (300 mg total) by mouth 2 (two) times daily for 7 days. 04/07/21 04/14/21  Trevor Iha, FNP  Chlorphen-PE-Acetaminophen (CORICIDIN D COLD/FLU/SINUS PO) Take by mouth.    [provider]  EPINEPHrine 0.3 mg/0.3 mL IJ SOAJ injection Inject 0.3 mLs into the muscle as directed. 03/23/16   [provider]  fexofenadine (ALLEGRA ALLERGY)  180 MG tablet Take 1 tablet (180 mg total) by mouth daily for 15 days. 04/07/21 04/22/21  Trevor Iha, FNP  metoprolol tartrate (LOPRESSOR) 25 MG tablet Take 1 tablet (25 mg total) by mouth 2 (two) times daily. 01/21/20   Runell Gess, MD  predniSONE (DELTASONE) 20 MG tablet Take 3 tabs PO daily x 5 days. 04/07/21   Trevor Iha, FNP    Family History Family History  Problem Relation Age of Onset   Lupus  Father    Healthy Brother     Social History Social History   Tobacco Use   Smoking status: Never   Smokeless tobacco: Never  Vaping Use   Vaping Use: Never used  Substance Use Topics   Alcohol use: Yes    Comment: approximately 1 drink per day   Drug use: No     Allergies   Bee venom   Review of Systems Review of Systems  HENT:  Positive for congestion and postnasal drip.   Respiratory:  Positive for cough.   All other systems reviewed and are negative.   Physical Exam Triage Vital Signs ED Triage Vitals  Enc Vitals Group     BP 04/10/21 1628 131/76     Pulse Rate 04/10/21 1628 64     Resp 04/10/21 1628 18     Temp 04/10/21 1628 97.9 F (36.6 C)     Temp Source 04/10/21 1628 Oral     SpO2 04/10/21 1628 97 %     Weight --      Height --      Head Circumference --      Peak Flow --      Pain Score 04/10/21 1629 0     Pain Loc --      Pain Edu? --      Excl. in GC? --    No data found.  Updated Vital Signs BP 131/76 (BP Location: Right Arm)   Pulse 64   Temp 97.9 F (36.6 C) (Oral)   Resp 18   SpO2 97%     Physical Exam Vitals and nursing note reviewed.  Constitutional:      General: He is not in acute distress.    Appearance: Normal appearance. He is normal weight. He is ill-appearing.  HENT:     Head: Normocephalic and atraumatic.     Right Ear: Tympanic membrane and external ear normal.     Left Ear: Tympanic membrane and external ear normal.     Ears:     Comments: Eustachian tube dysfunction noted bilaterally    Mouth/Throat:     Mouth: Mucous membranes are moist.     Pharynx: Oropharynx is clear.     Comments: Moderate amount of clear drainage of posterior oropharynx noted Eyes:     Extraocular Movements: Extraocular movements intact.     Conjunctiva/sclera: Conjunctivae normal.     Pupils: Pupils are equal, round, and reactive to light.  Cardiovascular:     Rate and Rhythm: Normal rate and regular rhythm.     Pulses: Normal  pulses.     Heart sounds: Normal heart sounds.  Pulmonary:     Effort: Pulmonary effort is normal.     Breath sounds: Normal breath sounds.  Musculoskeletal:        General: Normal range of motion.     Cervical back: Normal range of motion and neck supple. No tenderness.  Lymphadenopathy:     Cervical: No cervical adenopathy.  Skin:  General: Skin is warm and dry.  Neurological:     General: No focal deficit present.     Mental Status: He is alert and oriented to person, place, and time.     UC Treatments / Results  Labs (all labs ordered are listed, but only abnormal results are displayed) Labs Reviewed - No data to display  EKG   Radiology No results found.  Procedures Procedures (including critical care time)  Medications Ordered in UC Medications - No data to display  Initial Impression / Assessment and Plan / UC Course  I have reviewed the triage vital signs and the nursing notes.  Pertinent labs & imaging results that were available during my care of the patient were reviewed by me and considered in my medical decision making (see chart for details).    Cough-Advised patient to continue therapy started on 04/07/2021 and take medications as prescribed.  Advised patient if symptoms continue to please follow-up with PCP for further evaluation.  Patient discharged home, hemodynamically stable. Final Clinical Impressions(s) / UC Diagnoses   Final diagnoses:  Cough, unspecified type     Discharge Instructions      Advised patient to continue therapy started on 04/07/2021 and take medicines as prescribed.  Advised patient if symptoms continue to please follow-up with PCP for further evaluation.     ED Prescriptions   None    PDMP not reviewed this encounter.   Trevor Iha, FNP 04/10/21 [phone]

## 2021-04-10 NOTE — Discharge Instructions (Addendum)
Advised patient to continue therapy started on 04/07/2021 and take medicines as prescribed.  Advised patient if symptoms continue to please follow-up with PCP for further evaluation.

## 2021-04-10 NOTE — ED Triage Notes (Signed)
Pt c/o cough with mucous that is not improving from 11/15 visit. States he has been taking abx and steroids as prescribed but does not feel any better.

## 2021-08-15 ENCOUNTER — Other Ambulatory Visit: Payer: Self-pay

## 2021-08-15 ENCOUNTER — Ambulatory Visit (HOSPITAL_COMMUNITY)
Admission: EM | Admit: 2021-08-15 | Discharge: 2021-08-15 | Disposition: A | Discharge status: Home / Self Care | Payer: Medicare HMO | Attending: Internal Medicine | Admitting: Internal Medicine

## 2021-08-15 ENCOUNTER — Encounter (HOSPITAL_COMMUNITY): Payer: Self-pay | Admitting: *Deleted

## 2021-08-15 DIAGNOSIS — S61012A Laceration without foreign body of left thumb without damage to nail, initial encounter: Secondary | ICD-10-CM | POA: Diagnosis not present

## 2021-08-15 MED ORDER — BACITRACIN ZINC 500 UNIT/GM EX OINT
TOPICAL_OINTMENT | CUTANEOUS | Status: AC
  Filled 2021-08-15: qty 1.8

## 2021-08-15 NOTE — ED Provider Notes (Signed)
?[facility] ? ? ? ?CSN: [id number] ?Arrival date & time: 08/15/21  1152 ? ? ?  ? ?History   ?Chief Complaint ?Chief Complaint  ?Patient presents with  ? Laceration  ?  Entered by patient  ? Finger Injury  ? ? ?HPI ?Jerry Odonnell is a 66 y.o. male.  ? ?Patient presents with laceration to tip of left thumb that occurred yesterday.  Patient reports that he was using a knife yesterday when he accidentally cut off a portion of the tip of his finger pad of left thumb.  Patient takes aspirin but no other blood thinning medications.  He is concerned today because the bleeding stopped last night, but it restarted this morning when he took the bandage off.  Denies any numbness or tingling.  Patient has full range of motion of finger.  Last tetanus vaccine was within 5 years per patient. ? ? ?Laceration ? ?Past Medical History:  ?Diagnosis Date  ? Aortic stenosis   ? Hyperlipidemia   ? Personal history of TIA (transient ischemic attack)   ? Admission in 2017 to [facility] with reported small lesion seen on imaging  ? S/P minimally invasive aortic valve replacement with bioprosthetic valve 07/14/2016  ? 23 mm Edwards Intuity Elite rapid deployment bioprosthetic tissue valve via right mini thoracotomy approach  ? Syncope and collapse 07/09/2016  ? Thrombocytopenia (HCC)   ? ? ?Patient Active Problem List  ? Diagnosis Date Noted  ? Essential hypertension 10/02/2019  ? Cough 08/18/2016  ? S/P minimally invasive aortic valve replacement with bioprosthetic valve 07/14/2016  ? Syncope and collapse 07/09/2016  ? Hyperlipidemia 07/09/2016  ? Thrombocytopenia (HCC) 07/09/2016  ? ? ?Past Surgical History:  ?Procedure Laterality Date  ? AORTIC VALVE REPLACEMENT N/A 07/14/2016  ? Procedure: MINIMALLY INVASIVE AORTIC VALVE REPLACEMENT (AVR);  Surgeon: Purcell Nailslarence H Owen, MD;  [facility];  Service: Open Heart Surgery;  Laterality: N/A;  ? CHEST EXPLORATION Right 07/14/2016  ? Procedure: REEXPLORATION OF RIGHT THORACOTOMY FOR  BLEEDING;  Surgeon: Purcell Nailslarence H Owen, MD;  [facility];  Service: Open Heart Surgery;  Laterality: Right;  ? CHEST TUBE INSERTION N/A 07/16/2016  ? Procedure: CHEST TUBE REMOVAL, right chest;  Surgeon: Purcell Nailslarence H Owen, MD;  [facility];  Service: Thoracic;  Laterality: N/A;  ? POSTERIOR CERVICAL LAMINECTOMY  2013  ? RIGHT/LEFT HEART CATH AND CORONARY ANGIOGRAPHY N/A 07/09/2016  ? Procedure: Right/Left Heart Cath and Coronary Angiography;  Surgeon: Marykay Lexavid W Harding, MD;  [facility];  Service: Cardiovascular;  Laterality: N/A;  ? TEE WITHOUT CARDIOVERSION N/A 07/14/2016  ? Procedure: TRANSESOPHAGEAL ECHOCARDIOGRAM (TEE);  Surgeon: Purcell Nailslarence H Owen, MD;  [facility];  Service: Open Heart Surgery;  Laterality: N/A;  ? TEE WITHOUT CARDIOVERSION N/A 07/14/2016  ? Procedure: TRANSESOPHAGEAL ECHOCARDIOGRAM (TEE);  Surgeon: Purcell Nailslarence H Owen, MD;  [facility];  Service: Open Heart Surgery;  Laterality: N/A;  ? ? ? ? ? ?Home Medications   ? ?Prior to Admission medications   ?Medication Sig Start Date End Date Taking? Authorizing Provider  ?amoxicillin (AMOXIL) 500 MG capsule TAKE 4 CAPSULES 1 HOUR PRIOR TO DENTAL APPOINTMENT 08/13/19   [provider]  ?aspirin EC 81 MG tablet Take 81 mg by mouth daily. 01/17/16   [provider]  ?atorvastatin (LIPITOR) 20 MG tablet Take 1 tablet (20 mg total) by mouth daily. 10/21/20   Runell GessBerry, Jonathan J, MD  ?candesartan (ATACAND) 32 MG tablet Take 32 mg by mouth daily.  [provider]  ?Chlorphen-PE-Acetaminophen (CORICIDIN D COLD/FLU/SINUS PO) Take by mouth.    [provider]  ?EPINEPHrine 0.3 mg/0.3 mL IJ SOAJ injection Inject 0.3 mLs into the muscle as directed. 03/23/16   [provider]  ?fexofenadine (ALLEGRA ALLERGY) 180 MG tablet Take 1 tablet (180 mg total) by mouth daily for 15 days. 04/07/21 04/22/21  Trevor Iha, FNP  ?metoprolol tartrate (LOPRESSOR) 25 MG tablet Take 1 tablet (25 mg total) by mouth 2  (two) times daily. 01/21/20   Runell Gess, MD  ?predniSONE (DELTASONE) 20 MG tablet Take 3 tabs PO daily x 5 days. 04/07/21   Trevor Iha, FNP  ? ? ?Family History ?Family History  ?Problem Relation Age of Onset  ? Lupus Father   ? Healthy Brother   ? ? ?Social History ?Social History  ? ?Tobacco Use  ? Smoking status: Never  ? Smokeless tobacco: Never  ?Vaping Use  ? Vaping Use: Never used  ?Substance Use Topics  ? Alcohol use: Yes  ?  Comment: approximately 1 drink per day  ? Drug use: No  ? ? ? ?Allergies   ?Bee venom ? ? ?Review of Systems ?Review of Systems ?Per HPI ? ?Physical Exam ?Triage Vital Signs ?ED Triage Vitals  ?Enc Vitals Group  ?   BP --   ?   Pulse Rate 08/15/21 1217 (!) 2  ?   Resp 08/15/21 1217 18  ?   Temp 08/15/21 1217 98.6 ?F (37 ?C)  ?   Temp src --   ?   SpO2 08/15/21 1217 99 %  ?   Weight --   ?   Height --   ?   Head Circumference --   ?   Peak Flow --   ?   Pain Score 08/15/21 1214 6  ?   Pain Loc --   ?   Pain Edu? --   ?   Excl. in GC? --   ? ?No data found. ? ?Updated Vital Signs ?Pulse (!) 2   Temp 98.6 ?F (37 ?C)   Resp 18   SpO2 99%  ? ?Visual Acuity ?Right Eye Distance:   ?Left Eye Distance:   ?Bilateral Distance:   ? ?Right Eye Near:   ?Left Eye Near:    ?Bilateral Near:    ? ?Physical Exam ?Constitutional:   ?   General: He is not in acute distress. ?   Appearance: Normal appearance. He is not toxic-appearing or diaphoretic.  ?HENT:  ?   Head: Normocephalic and atraumatic.  ?Eyes:  ?   Extraocular Movements: Extraocular movements intact.  ?   Conjunctiva/sclera: Conjunctivae normal.  ?Pulmonary:  ?   Effort: Pulmonary effort is normal.  ?Skin: ?   Comments: skin has been removed superficially from pad of left thumb.  No deep abrasion/laceration.  Mild bleeding noted on original exam.  Patient has full range of motion of thumb.  Neurovascular intact.  ?Neurological:  ?   General: No focal deficit present.  ?   Mental Status: He is alert and oriented to person, place,  and time. Mental status is at baseline.  ?Psychiatric:     ?   Mood and Affect: Mood normal.     ?   Behavior: Behavior normal.     ?   Thought Content: Thought content normal.     ?   Judgment: Judgment normal.  ? ? ? ?UC Treatments / Results  ?Labs ?(all labs ordered are listed, but  only abnormal results are displayed) ?Labs Reviewed - No data to display ? ?EKG ? ? ?Radiology ?No results found. ? ?Procedures ?Procedures (including critical care time) ? ?Medications Ordered in UC ?Medications - No data to display ? ?Initial Impression / Assessment and Plan / UC Course  ?I have reviewed the triage vital signs and the nursing notes. ? ?Pertinent labs & imaging results that were available during my care of the patient were reviewed by me and considered in my medical decision making (see chart for details). ? ?  ? ?Pressure dressing was applied and bleeding was stopped prior to discharge.  Wound was cleansed, antibiotic ointment was applied, and a nonadherent dressing was applied.  Patient advised to keep covered until healed.  No sutures needed as top layer of skin has been removed.  No need for emergent evaluation at the hospital at this time given superficial state of laceration.  Patient also has full range of motion of finger.  Tetanus is up-to-date per patient.  Patient advised to monitor for signs of infection and to return if these occur.  Discussed return precautions.  Patient verbalized understanding and was agreeable with plan.  Nurse accidentally documented 2 as patient's heart rate but heart rate was 54. This was verified.  ?Final Clinical Impressions(s) / UC Diagnoses  ? ?Final diagnoses:  ?Laceration of left thumb without foreign body without damage to nail, initial encounter  ? ? ? ?Discharge Instructions   ? ?  ?Your bleeding has been stopped.  A dressing has been applied in urgent care.  Please keep wound covered until healed.  Follow-up if any symptoms of infection occur that include increased  redness, swelling, pus. ? ? ? ? ?ED Prescriptions   ?None ?  ? ?PDMP not reviewed this encounter. ?  ?Gustavus Bryant, [place] ?08/15/21 1331 ? ?

## 2021-08-15 NOTE — Discharge Instructions (Signed)
Your bleeding has been stopped.  A dressing has been applied in urgent care.  Please keep wound covered until healed.  Follow-up if any symptoms of infection occur that include increased redness, swelling, pus. ?

## 2021-08-15 NOTE — ED Triage Notes (Signed)
Pt reports he cut the tip of his Lt thumb off yesterday. Pt takes baby ASA and the site is still bleeding. ?

## 2021-09-22 ENCOUNTER — Ambulatory Visit (HOSPITAL_COMMUNITY): Payer: Medicare HMO | Attending: Internal Medicine

## 2021-09-22 DIAGNOSIS — Z953 Presence of xenogenic heart valve: Secondary | ICD-10-CM | POA: Diagnosis not present

## 2021-09-22 LAB — ECHOCARDIOGRAM COMPLETE
AV Mean grad: 10 mmHg
AV Peak grad: 17.6 mmHg
Ao pk vel: 2.1 m/s
Area-P 1/2: 3.08 cm2
S' Lateral: 2.8 cm

## 2021-09-23 ENCOUNTER — Other Ambulatory Visit: Payer: Self-pay | Admitting: *Deleted

## 2021-09-23 DIAGNOSIS — Z953 Presence of xenogenic heart valve: Secondary | ICD-10-CM

## 2021-10-14 ENCOUNTER — Encounter: Payer: Self-pay | Admitting: Cardiovascular Disease

## 2021-10-14 ENCOUNTER — Ambulatory Visit: Payer: Medicare HMO | Admitting: Cardiovascular Disease

## 2021-10-14 DIAGNOSIS — E782 Mixed hyperlipidemia: Secondary | ICD-10-CM | POA: Diagnosis not present

## 2021-10-14 DIAGNOSIS — Z953 Presence of xenogenic heart valve: Secondary | ICD-10-CM | POA: Diagnosis not present

## 2021-10-14 DIAGNOSIS — I10 Essential (primary) hypertension: Secondary | ICD-10-CM

## 2021-10-14 NOTE — Assessment & Plan Note (Signed)
History of minimally invasive aortic valve replacement by Dr. Cornelius Moras 07/14/2016 with a 23 mm Edwards Intuity rapid deployment pericardial tissue valve.  He has done well since and is very active.  His most recent 2D echo performed 09/22/2021 was unchanged from his prior echo revealing normal LV function and a well-functioning aortic bioprosthesis.  This will be rechecked in 12 months.

## 2021-10-14 NOTE — Assessment & Plan Note (Signed)
History of essential hypertension a blood pressure measured today at 124/74.  He is on Atacand and metoprolol.

## 2021-10-14 NOTE — Assessment & Plan Note (Signed)
History of hyperlipidemia on statin therapy with lipid profile performed by his PCP 4 months ago revealing total cholesterol 145, LDL 67 and HDL of 62.

## 2021-10-14 NOTE — Progress Notes (Signed)
10/14/2021 Jerry Odonnell   [birth year]  [id number]  Primary Physician Windy Canny, MD Primary Cardiologist: Runell Gess MD [name], [place]  HPI:  Jerry Odonnell is a 66 y.o.   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 I last saw him  in the office 10/01/2020.[name] He has no cardiac risk factors. He is active and walks 4-5 miles a day. PCP heard a murmur and followed up with a 2-D echocardiogram 04/28/16 that revealed normal LV size and function, moderate concentric LVH with severe aortic stenosis. The mean gradient was 86 mm of mercury. He has had one episode of dizziness back in August. He denies chest pain or shortness of breath. My intention was to perform elective right and left heart cath on him 2 weeks after his initial visit however 2 days later he had syncope and was brought to [facility]. He had a right left heart cath performed by Dr. Herbie Baltimore confirming critical aortic stenosis with minimal CAD. He was seen by Dr. Laneta Simmers and caused consultation the following day and several days later on 07/14/16 he underwent minimally invasive aortic valve replacement by Dr. Cornelius Moras with a 23 mm Edwards Intuity rapid deployment pericardial tissue valve. He had some chest wall bleeding requiring reexploration the same day and was discharged 5 days later. He did participate in the rehabilitation program at [facility].   He works out with a trainer 3 days a week at fitness together and does cardio on his own walking, biking and hiking without limitation.  Recent echo performed 09/22/2021 revealed normal LV function with a normal functioning aortic bioprosthesis  with a mean gradient of 10 mmHg. lipid profile performed  03/03/2020 revealed total cholesterol of 145, LDL of 67 and HDL of 63.  Current Meds  Medication Sig   amoxicillin (AMOXIL) 500 MG capsule TAKE 4 CAPSULES 1 HOUR PRIOR TO DENTAL APPOINTMENT   aspirin EC 81 MG tablet Take 81 mg by mouth daily.   atorvastatin (LIPITOR) 20 MG tablet Take 1 tablet (20 mg total) by mouth daily.   candesartan (ATACAND) 32 MG tablet Take 32 mg by mouth daily.   EPINEPHrine 0.3 mg/0.3 mL IJ SOAJ injection Inject 0.3 mLs into the muscle as directed.   metoprolol tartrate (LOPRESSOR) 25 MG tablet Take 1 tablet (25 mg total) by mouth 2 (two) times daily.     Allergies  Allergen Reactions   Bee Venom Swelling    SWELLING REACTION UNSPECIFIED     Social History   Socioeconomic History   Marital status: Widowed    Spouse name: Not on file   Number of children: Not on file   Years of education: Not on file   Highest education level: Not on file  Occupational History   Occupation: R&D Event organiser: [employer]  Tobacco Use   Smoking status: Never   Smokeless tobacco: Never  Vaping Use   Vaping Use: Never used  Substance and Sexual Activity   Alcohol use: Yes    Comment: approximately 1 drink per day   Drug use: No   Sexual activity: Not on file  Other  Topics Concern   Not on file  Social History Narrative   Not on file   Social Determinants of Health   Financial Resource Strain: Not on file  Food Insecurity: Not on file  Transportation Needs: Not on file  Physical Activity: Not on file  Stress: Not on file  Social Connections: Not on file  Intimate Partner Violence: Not on file     Review of Systems: General: negative for chills, fever, night sweats or weight changes.  Cardiovascular: negative for chest pain, dyspnea on exertion, edema, orthopnea, palpitations, paroxysmal nocturnal dyspnea or shortness of breath Dermatological: negative for rash Respiratory: negative for cough or  wheezing Urologic: negative for hematuria Abdominal: negative for nausea, vomiting, diarrhea, bright red blood per rectum, melena, or hematemesis Neurologic: negative for visual changes, syncope, or dizziness All other systems reviewed and are otherwise negative except as noted above.    Blood pressure 124/74, pulse (!) 53, height 6' (1.829 m), weight 207 lb 12.8 oz (94.3 kg), SpO2 99 %.  General appearance: alert and no distress Neck: no adenopathy, no carotid bruit, no JVD, supple, symmetrical, trachea midline, and thyroid not enlarged, symmetric, no tenderness/mass/nodules Lungs: clear to auscultation bilaterally Heart: regular rate and rhythm, S1, S2 normal, no murmur, click, rub or gallop Extremities: extremities normal, atraumatic, no cyanosis or edema Pulses: 2+ and symmetric Skin: Skin color, texture, turgor normal. No rashes or lesions Neurologic: Grossly normal  EKG sinus bradycardia at 53 with left axis deviation and LVH with QRS widening.  Personally reviewed this EKG.  ASSESSMENT AND PLAN:   Hyperlipidemia History of hyperlipidemia on statin therapy with lipid profile performed by his PCP 4 months ago revealing total cholesterol 145, LDL 67 and HDL of 62.  S/P minimally invasive aortic valve replacement with bioprosthetic valve History of minimally invasive aortic valve replacement by Dr. Cornelius Moras 07/14/2016 with a 23 mm Edwards Intuity rapid deployment pericardial tissue valve.  He has done well since and is very active.  His most recent 2D echo performed 09/22/2021 was unchanged from his prior echo revealing normal LV function and a well-functioning aortic bioprosthesis.  This will be rechecked in 12 months.  Essential hypertension History of essential hypertension a blood pressure measured today at 124/74.  He is on Atacand and metoprolol.     Runell Gess MD FACP,FACC,FAHA, [facility] 10/14/2021 3:18 PM

## 2021-10-14 NOTE — Patient Instructions (Signed)
Medication Instructions:  Your physician recommends that you continue on your current medications as directed. Please refer to the Current Medication list given to you today.  *If you need a refill on your cardiac medications before your next appointment, please call your pharmacy*   Testing/Procedures: Your physician has requested that you have an echocardiogram. Echocardiography is a painless test that uses sound waves to create images of your heart. It provides your doctor with information about the size and shape of your heart and how well your heart's chambers and valves are working. This procedure takes approximately one hour. There are no restrictions for this procedure. To be done in 1 year. This procedure will be done at [address]    Follow-Up: At [facility], you and your health needs are our priority.  As part of our continuing mission to provide you with exceptional heart care, we have created designated Provider Care Teams.  These Care Teams include your primary Cardiologist (physician) and Advanced Practice Providers (APPs -  Physician Assistants and Nurse Practitioners) who all work together to provide you with the care you need, when you need it.  We recommend signing up for the patient portal called "MyChart".  Sign up information is provided on this After Visit Summary.  MyChart is used to connect with patients for Virtual Visits (Telemedicine).  Patients are able to view lab/test results, encounter notes, upcoming appointments, etc.  Non-urgent messages can be sent to your provider as well.   To learn more about what you can do with MyChart, go to [email or web address].    Your next appointment:   12 month(s)  The format for your next appointment:   In Person  Provider:   Nanetta Batty, MD

## 2021-11-25 ENCOUNTER — Ambulatory Visit: Payer: Medicare HMO | Admitting: Cardiovascular Disease

## 2022-09-22 ENCOUNTER — Ambulatory Visit (HOSPITAL_COMMUNITY): Payer: Medicare HMO | Attending: Cardiovascular Disease

## 2022-09-22 DIAGNOSIS — Z87891 Personal history of nicotine dependence: Secondary | ICD-10-CM | POA: Diagnosis not present

## 2022-09-22 DIAGNOSIS — Z952 Presence of prosthetic heart valve: Secondary | ICD-10-CM | POA: Insufficient documentation

## 2022-09-22 DIAGNOSIS — E782 Mixed hyperlipidemia: Secondary | ICD-10-CM | POA: Insufficient documentation

## 2022-09-22 DIAGNOSIS — I10 Essential (primary) hypertension: Secondary | ICD-10-CM | POA: Diagnosis not present

## 2022-09-22 DIAGNOSIS — Z9889 Other specified postprocedural states: Secondary | ICD-10-CM

## 2022-09-22 DIAGNOSIS — E785 Hyperlipidemia, unspecified: Secondary | ICD-10-CM | POA: Insufficient documentation

## 2022-09-22 DIAGNOSIS — I351 Nonrheumatic aortic (valve) insufficiency: Secondary | ICD-10-CM | POA: Diagnosis not present

## 2022-09-22 LAB — ECHOCARDIOGRAM COMPLETE
AR max vel: 1.88 cm2
AV Area VTI: 1.9 cm2
AV Area mean vel: 1.81 cm2
AV Mean grad: 15.5 mmHg
AV Peak grad: 28.6 mmHg
Ao pk vel: 2.67 m/s
Area-P 1/2: 3.12 cm2
S' Lateral: 2.9 cm

## 2022-09-28 NOTE — Progress Notes (Unsigned)
Cardiology Clinic Note   Patient Name: Paulie Astin Depaul Date of Encounter: 09/30/2022  Primary Care Provider:  Windy Canny, MD Primary Cardiologist:  None  Patient Profile    Safeer Gillman Eccleston 67 year old male presents the clinic today for follow-up evaluation of his HTN, HLD, and aortic valve replacement.  Past Medical History    Past Medical History:  Diagnosis Date   Aortic stenosis    Hyperlipidemia    Personal history of TIA (transient ischemic attack)    Admission in 2017 to [facility] with reported small lesion seen on imaging   S/P minimally invasive aortic valve replacement with bioprosthetic valve 07/14/2016   23 mm Edwards Intuity Elite rapid deployment bioprosthetic tissue valve via right mini thoracotomy approach   Syncope and collapse 07/09/2016   Thrombocytopenia (HCC)    Past Surgical History:  Procedure Laterality Date   AORTIC VALVE REPLACEMENT N/A 07/14/2016   Procedure: MINIMALLY INVASIVE AORTIC VALVE REPLACEMENT (AVR);  Surgeon: Purcell Nails, MD;  Location: [facility] OR;  Service: Open Heart Surgery;  Laterality: N/A;   CHEST EXPLORATION Right 07/14/2016   Procedure: REEXPLORATION OF RIGHT THORACOTOMY FOR BLEEDING;  Surgeon: Purcell Nails, MD;  Location: [facility] OR;  Service: Open Heart Surgery;  Laterality: Right;   CHEST TUBE INSERTION N/A 07/16/2016   Procedure: CHEST TUBE REMOVAL, right chest;  Surgeon: Purcell Nails, MD;  Location: [facility] OR;  Service: Thoracic;  Laterality: N/A;   POSTERIOR CERVICAL LAMINECTOMY  2013   RIGHT/LEFT HEART CATH AND CORONARY ANGIOGRAPHY N/A 07/09/2016   Procedure: Right/Left Heart Cath and Coronary Angiography;  Surgeon: Marykay Lex, MD;  Location: [facility] INVASIVE CV LAB;  Service: Cardiovascular;  Laterality: N/A;   TEE WITHOUT CARDIOVERSION N/A 07/14/2016   Procedure: TRANSESOPHAGEAL ECHOCARDIOGRAM (TEE);  Surgeon: Purcell Nails, MD;  Location: [facility] OR;  Service: Open Heart Surgery;  Laterality: N/A;   TEE WITHOUT CARDIOVERSION  N/A 07/14/2016   Procedure: TRANSESOPHAGEAL ECHOCARDIOGRAM (TEE);  Surgeon: Purcell Nails, MD;  Location: [facility] OR;  Service: Open Heart Surgery;  Laterality: N/A;    Allergies  Allergies  Allergen Reactions   Bee Venom Swelling    SWELLING REACTION UNSPECIFIED     History of Present Illness    Shahram Frew is a PMH of HLD, aortic valve stenosis status post aortic valve replacement with bioprosthetic aortic valve (2/21), and hypertension.  He is retired from [employer].  He was researching Surveyor, mining.  He retired in 2019.  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  He was initially referred for evaluation of aortic valve stenosis.  He was seen and evaluated.  He denied chest pain or shortness of breath at that time.  Dr.Berry planned a elective cardiac catheterization that was to be performed 2 weeks after his initial cardiology visit.  However, 2 days later he had a syncopal event and was taken to [facility].  He underwent right and left heart cath by Dr. Herbie Baltimore which showed critical aortic stenosis with minimal coronary disease.  He was seen and evaluated by CVTS and underwent minimally invasive aortic valve replacement with a 23 mm Edwards tissue valve.  He did have some postoperative chest wall bleeding.  He was discharged 5 days later.  He completed cardiac rehab at [facility]  He was seen in follow-up by Dr. Allyson Sabal on 10/14/2021.  During that time he continued to be very physically active.  He was working out with a Psychologist, educational 3  days/week.  He was also walking, biking, and hiking without limitations.  His echocardiogram 09/22/2021 showed normal LV function and normal functioning aortic prosthesis.  His lipid panel showed total cholesterol of 145, LDL cholesterol of 67 and HDL of 63.  He presents to the clinic today for follow-up evaluation and states he continues to be very physically active.  He is working out with a trainer 3 days a week  and walking 4 miles daily.  His blood pressure today is 98/42 and his EKG shows sinus bradycardia 46 bpm.  He reports that he was at [facility] yesterday for evaluation of his essential tremor.  His blood pressure was 112/58.  We reviewed his echocardiogram.  I will decrease his metoprolol to 12.5 mg twice daily, have him continue his diet and exercise and plan follow-up in 12 months..  Today he denies chest pain, shortness of breath, lower extremity edema, fatigue, palpitations, melena, hematuria, hemoptysis, diaphoresis, weakness, presyncope, syncope, orthopnea, and PND.     Home Medications    Prior to Admission medications   Medication Sig Start Date End Date Taking? Authorizing Provider  amoxicillin (AMOXIL) 500 MG capsule TAKE 4 CAPSULES 1 HOUR PRIOR TO DENTAL APPOINTMENT 08/13/19   [provider]  aspirin EC 81 MG tablet Take 81 mg by mouth daily. 01/17/16   [provider]  atorvastatin (LIPITOR) 20 MG tablet Take 1 tablet (20 mg total) by mouth daily. 10/21/20   Runell Gess, MD  candesartan (ATACAND) 32 MG tablet Take 32 mg by mouth daily.    [provider]  EPINEPHrine 0.3 mg/0.3 mL IJ SOAJ injection Inject 0.3 mLs into the muscle as directed. 03/23/16   [provider]  metoprolol tartrate (LOPRESSOR) 25 MG tablet Take 1 tablet (25 mg total) by mouth 2 (two) times daily. 01/21/20   Runell Gess, MD    Family History    Family History  Problem Relation Age of Onset   Lupus Father    Healthy Brother    He indicated that his mother is deceased. He indicated that his father is deceased. He indicated that all of his five brothers are alive. He indicated that his daughter is alive. He indicated that his son is alive.  Social History    Social History   Socioeconomic History   Marital status: Married    Spouse name: Not on file   Number of children: Not on file   Years of education: Not on file   Highest education level: Not on file   Occupational History   Occupation: R&D Event organiser: [employer]  Tobacco Use   Smoking status: Never   Smokeless tobacco: Never  Vaping Use   Vaping Use: Never used  Substance and Sexual Activity   Alcohol use: Yes    Comment: approximately 1 drink per day   Drug use: No   Sexual activity: Not on file  Other Topics Concern   Not on file  Social History Narrative   Not on file   Social Determinants of Health   Financial Resource Strain: Not on file  Food Insecurity: Not on file  Transportation Needs: Not on file  Physical Activity: Not on file  Stress: Not on file  Social Connections: Not on file  Intimate Partner Violence: Not on file     Review of Systems    General:  No chills, fever, night sweats or weight changes.  Cardiovascular:  No chest pain, dyspnea on exertion, edema, orthopnea, palpitations,  paroxysmal nocturnal dyspnea. Dermatological: No rash, lesions/masses Respiratory: No cough, dyspnea Urologic: No hematuria, dysuria Abdominal:   No nausea, vomiting, diarrhea, bright red blood per rectum, melena, or hematemesis Neurologic:  No visual changes, wkns, changes in mental status. All other systems reviewed and are otherwise negative except as noted above.  Physical Exam    VS:  BP (!) 98/42   Pulse (!) 46   Ht 6' (1.829 m)   Wt 200 lb 9.6 oz (91 kg)   SpO2 98%   BMI 27.21 kg/m  , BMI Body mass index is 27.21 kg/m. GEN: Well nourished, well developed, in no acute distress. HEENT: normal. Neck: Supple, no JVD, carotid bruits, or masses. Cardiac: RRR, no murmurs, rubs, or gallops. No clubbing, cyanosis, edema.  Radials/DP/PT 2+ and equal bilaterally.  Respiratory:  Respirations regular and unlabored, clear to auscultation bilaterally. GI: Soft, nontender, nondistended, BS + x 4. MS: no deformity or atrophy. Skin: warm and dry, no rash. Neuro:  Strength and sensation are intact. Psych: Normal affect.  Accessory Clinical Findings    Recent  Labs: No results found for requested labs within last 365 days.   Recent Lipid Panel No results found for: "CHOL", "TRIG", "HDL", "CHOLHDL", "VLDL", "LDLCALC", "LDLDIRECT"       ECG personally reviewed by me today-sinus bradycardia possible left atrial enlargement left axis deviation 46 bpm- No acute changes  Echocardiogram 09/22/2022 IMPRESSIONS   1. Left ventricular ejection fraction, by estimation, is 60 to 65%. The left ventricle has normal function. The left ventricle has no regional wall motion abnormalities. Left ventricular diastolic parameters were normal. 2. Right ventricular systolic function is normal. The right ventricular size is normal. There is normal pulmonary artery systolic pressure. The estimated right ventricular systolic pressure is 22.0 mmHg. 3. Left atrial size was mildly dilated. 4. Right atrial size was moderately dilated. 5. The mitral valve is normal in structure. Trivial mitral valve regurgitation. 6. There is a 23 mm bioprosthetic valve present in the aortic position. Aortic valve regurgitation is mild. Mild PVL. MG , increased from on prior echo 09/2021, though stable from echo 09/2019 ( ) 7. The inferior vena cava is normal in size with greater than 50% respiratory variability, suggesting right atrial pressure of 3 mmHg.  FINDINGS Left Ventricle: Left ventricular ejection fraction, by estimation, is 60 to 65%. The left ventricle has normal function. The left ventricle has no regional wall motion abnormalities. The left ventricular internal cavity size was normal in size. There is no left ventricular hypertrophy. Left ventricular diastolic parameters were normal.  Right Ventricle: The right ventricular size is normal. No increase in right ventricular wall thickness. Right ventricular systolic function is normal. There is normal pulmonary artery systolic pressure. The tricuspid regurgitant velocity is 2.18 m/s, and with an assumed right atrial  pressure of 3 mmHg, the estimated right ventricular systolic pressure is 22.0 mmHg.  Left Atrium: Left atrial size was mildly dilated.  Right Atrium: Right atrial size was moderately dilated.  Pericardium: There is no evidence of pericardial effusion.  Mitral Valve: The mitral valve is normal in structure. Trivial mitral valve regurgitation.  Tricuspid Valve: The tricuspid valve is normal in structure. Tricuspid valve regurgitation is trivial.  Aortic Valve: The aortic valve has been repaired/replaced. Aortic valve regurgitation is mild. Aortic valve mean gradient measures 15.5 mmHg. Aortic valve peak gradient measures 28.6 mmHg. Aortic valve area, by VTI measures 1.90 cm. There is a 23 mm  bioprosthetic valve present in the aortic position.  Pulmonic Valve: The pulmonic valve was not well visualized. Pulmonic valve regurgitation is trivial.  Aorta: The aortic root and ascending aorta are structurally normal, with no evidence of dilitation.  Venous: The inferior vena cava is normal in size with greater than 50% respiratory variability, suggesting right atrial pressure of 3 mmHg.  IAS/Shunts: The interatrial septum was not well visualized.   Assessment & Plan   1.  Essential hypertension-BP today 98/42 Decrease metoprolol to 12.5 mg twice daily, continue candesartan Maintain blood pressure log Heart healthy low-sodium diet  Aortic valve stenosis-status post minimally invasive aortic valve replacement.  Continues to be very physically active.  Echocardiogram showed normal LV function and well-functioning bioprosthetic aortic valve. Maintain physical activity Plan for repeat echocardiogram in 12 months  Hyperlipidemia- request lipid panel from PCP Maintain physical activity High-fiber diet Continue atorvastatin   Disposition: Follow-up with Dr.Berry in 1 year.   Thomasene Ripple. Shylo Zamor NP-C     09/30/2022, 9:48 AM [facility] [address] Office [phone] Fax [phone]    I spent 14 minutes examining this patient, reviewing medications, and using patient centered shared decision making involving her cardiac care.  Prior to her visit I spent greater than 20 minutes reviewing her past medical history,  medications, and prior cardiac tests.

## 2022-09-30 ENCOUNTER — Encounter: Payer: Self-pay | Admitting: General Practice

## 2022-09-30 ENCOUNTER — Ambulatory Visit: Payer: Medicare HMO | Attending: General Practice | Admitting: General Practice

## 2022-09-30 VITALS — BP 98/42 | HR 46 | Ht 72.0 in | Wt 200.6 lb

## 2022-09-30 DIAGNOSIS — Z953 Presence of xenogenic heart valve: Secondary | ICD-10-CM | POA: Diagnosis not present

## 2022-09-30 DIAGNOSIS — I10 Essential (primary) hypertension: Secondary | ICD-10-CM | POA: Diagnosis not present

## 2022-09-30 DIAGNOSIS — E782 Mixed hyperlipidemia: Secondary | ICD-10-CM | POA: Diagnosis not present

## 2022-09-30 MED ORDER — METOPROLOL TARTRATE 25 MG PO TABS: 12.5000 mg | ORAL_TABLET | Freq: Two times a day (BID) | ORAL | 12 refills | Status: DC

## 2022-09-30 NOTE — Patient Instructions (Addendum)
Medication Instructions:  DECREASE METOPROLOL 12.5MG  (1/2 TAB) TWICE DAILY *If you need a refill on your cardiac medications before your next appointment, please call your pharmacy*  Lab Work: NONE-WILL WILL TRY TO GET FROM YOUR PRIMARY CARE If you have labs (blood work) drawn today and your tests are completely normal, you will receive your results only by:  MyChart Message (if you have MyChart) OR  A paper copy in the mail If you have any lab test that is abnormal or we need to change your treatment, we will call you to review the results.  TESTING Echocardiogram - Your physician has requested that you have an echocardiogram. Echocardiography is a painless test that uses sound waves to create images of your heart. It provides your doctor with information about the size and shape of your heart and how well your heart's chambers and valves are working. This procedure takes approximately one hour. There are no restrictions for this procedure.    Other Instructions MAINTAIN PHYSICAL ACTIVITY AND DIET   Follow-Up: At [facility], you and your health needs are our priority.  As part of our continuing mission to provide you with exceptional heart care, we have created designated Provider Care Teams.  These Care Teams include your primary Cardiologist (physician) and Advanced Practice Providers (APPs -  Physician Assistants and Nurse Practitioners) who all work together to provide you with the care you need, when you need it.  Your next appointment:   12 month(s)  Provider:   Nanetta Batty, MD

## 2022-09-30 NOTE — Addendum Note (Signed)
Addended by: Alyson Ingles on: 09/30/2022 10:05 AM   Modules accepted: Orders

## 2022-11-29 MED ORDER — METOPROLOL TARTRATE 25 MG PO TABS: 12.5000 mg | ORAL_TABLET | Freq: Two times a day (BID) | ORAL | 12 refills | Status: DC

## 2022-12-22 ENCOUNTER — Ambulatory Visit
Admission: RE | Admit: 2022-12-22 | Discharge: 2022-12-22 | Disposition: A | Discharge status: Home / Self Care | Payer: Medicare HMO | Source: Ambulatory Visit | Attending: Physician Assistant | Admitting: Physician Assistant

## 2022-12-22 VITALS — BP 121/78 | HR 60 | Temp 97.5°F | Resp 16

## 2022-12-22 DIAGNOSIS — T63481A Toxic effect of venom of other arthropod, accidental (unintentional), initial encounter: Secondary | ICD-10-CM

## 2022-12-22 MED ORDER — PREDNISONE 20 MG PO TABS
20.0000 mg | ORAL_TABLET | Freq: Every day | ORAL | 0 refills | Status: AC
Start: 2022-12-22 — End: 2022-12-25

## 2022-12-22 MED ORDER — CETIRIZINE HCL 5 MG PO TABS: 5.0000 mg | ORAL_TABLET | Freq: Every day | ORAL | 0 refills | Status: DC

## 2022-12-22 MED ORDER — FAMOTIDINE 20 MG PO TABS: 20.0000 mg | ORAL_TABLET | Freq: Every day | ORAL | 0 refills | Status: DC

## 2022-12-22 NOTE — ED Provider Notes (Signed)
[facility]    CSN: [id number] Arrival date & time: 12/22/22  1244      History   Chief Complaint Chief Complaint  Patient presents with   Allergic Reaction    Entered by patient    HPI Jerry Odonnell is a 67 y.o. male.   Patient presents today with a 1 day history of enlarging erythematous and pruritic lesion on the posterior portion of his right leg.  He was outside when he felt a sharp stinging sensation in his right popliteal fossa.  He immediately swatted at this area but then developed redness and associated itching.  He was unable to see the type of insect that caused the initial injury.  Reports that the redness has significantly enlarged prompting evaluation today.  He denies any additional symptoms including shortness of breath, swelling of his throat, nausea, vomiting.  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  He has not tried any systemic medications for symptom management but has tried topical anti-itch cream with minimal improvement.    Past Medical History:  Diagnosis Date   Aortic stenosis    Hyperlipidemia    Personal history of TIA (transient ischemic attack)    Admission in 2017 to [facility] with reported small lesion seen on imaging   S/P minimally invasive aortic valve replacement with bioprosthetic valve 07/14/2016   23 mm Edwards Intuity Elite rapid deployment bioprosthetic tissue valve via right mini thoracotomy approach   Syncope and collapse 07/09/2016   Thrombocytopenia [facility])     Patient Active Problem List   Diagnosis Date Noted   Essential hypertension 10/02/2019   Cough 08/18/2016   S/P minimally invasive aortic valve replacement with bioprosthetic valve 07/14/2016   Syncope and collapse 07/09/2016   Hyperlipidemia 07/09/2016   Thrombocytopenia (HCC) 07/09/2016    Past Surgical History:  Procedure Laterality Date   AORTIC VALVE  REPLACEMENT N/A 07/14/2016   Procedure: MINIMALLY INVASIVE AORTIC VALVE REPLACEMENT (AVR);  Surgeon: Purcell Nails, MD;  Location: [facility] OR;  Service: Open Heart Surgery;  Laterality: N/A;   CHEST EXPLORATION Right 07/14/2016   Procedure: REEXPLORATION OF RIGHT THORACOTOMY FOR BLEEDING;  Surgeon: Purcell Nails, MD;  Location: [facility] OR;  Service: Open Heart Surgery;  Laterality: Right;   CHEST TUBE INSERTION N/A 07/16/2016   Procedure: CHEST TUBE REMOVAL, right chest;  Surgeon: Purcell Nails, MD;  Location: [facility] OR;  Service: Thoracic;  Laterality: N/A;   POSTERIOR CERVICAL LAMINECTOMY  2013   RIGHT/LEFT HEART CATH AND CORONARY ANGIOGRAPHY N/A 07/09/2016   Procedure: Right/Left Heart Cath and Coronary Angiography;  Surgeon: Marykay Lex, MD;  Location: [facility] INVASIVE CV LAB;  Service: Cardiovascular;  Laterality: N/A;   TEE WITHOUT CARDIOVERSION N/A 07/14/2016   Procedure: TRANSESOPHAGEAL ECHOCARDIOGRAM (TEE);  Surgeon: Purcell Nails, MD;  Location: [facility] OR;  Service: Open Heart Surgery;  Laterality: N/A;   TEE WITHOUT CARDIOVERSION N/A 07/14/2016   Procedure: TRANSESOPHAGEAL ECHOCARDIOGRAM (TEE);  Surgeon: Purcell Nails, MD;  Location: [facility] OR;  Service: Open Heart Surgery;  Laterality: N/A;       Home Medications    Prior to Admission medications   Medication Sig Start Date End Date Taking? Authorizing Provider  cetirizine (ZYRTEC) 5 MG tablet Take 1 tablet (5 mg total) by mouth daily. 12/22/22  Yes Deronda Christian K, PA-C  famotidine (PEPCID) 20 MG tablet Take 1 tablet (20 mg total) by mouth  at bedtime. 12/22/22  Yes Shataria Crist K, PA-C  predniSONE (DELTASONE) 20 MG tablet Take 1 tablet (20 mg total) by mouth daily for 3 days. 12/22/22 12/25/22 Yes Gazella Anglin K, PA-C  amoxicillin (AMOXIL) 500 MG capsule TAKE 4 CAPSULES 1 HOUR PRIOR TO DENTAL APPOINTMENT 08/13/19   [provider]  aspirin EC 81 MG tablet Take 81 mg by mouth daily. 01/17/16   [provider]  atorvastatin (LIPITOR) 20  MG tablet Take 1 tablet (20 mg total) by mouth daily. 10/21/20   Runell Gess, MD  candesartan (ATACAND) 32 MG tablet Take 32 mg by mouth daily.    [provider]  EPINEPHrine 0.3 mg/0.3 mL IJ SOAJ injection Inject 0.3 mLs into the muscle as directed. 03/23/16   [provider]  metoprolol tartrate (LOPRESSOR) 25 MG tablet Take 0.5 tablets (12.5 mg total) by mouth 2 (two) times daily. 11/29/22   Ronney Asters, NP    Family History Family History  Problem Relation Age of Onset   Lupus Father    Healthy Brother     Social History Social History   Tobacco Use   Smoking status: Never   Smokeless tobacco: Never  Vaping Use   Vaping status: Never Used  Substance Use Topics   Alcohol use: Yes    Comment: approximately 1 drink per day   Drug use: No     Allergies   Bee venom   Review of Systems Review of Systems  Constitutional:  Negative for activity change, appetite change, fatigue and fever.  Gastrointestinal:  Negative for abdominal pain, diarrhea, nausea and vomiting.  Musculoskeletal:  Negative for arthralgias and myalgias.  Skin:  Positive for color change and wound.     Physical Exam Triage Vital Signs ED Triage Vitals  Encounter Vitals Group     BP 12/22/22 1251 121/78     Systolic BP Percentile --      Diastolic BP Percentile --      Pulse Rate 12/22/22 1251 60     Resp 12/22/22 1251 16     Temp 12/22/22 1251 (!) 97.5 F (36.4 C)     Temp Source 12/22/22 1251 Oral     SpO2 12/22/22 1251 97 %     Weight --      Height --      Head Circumference --      Peak Flow --      Pain Score 12/22/22 1254 2     Pain Loc --      Pain Education --      Exclude from Growth Chart --    No data found.  Updated Vital Signs BP 121/78 (BP Location: Right Arm)   Pulse 60   Temp (!) 97.5 F (36.4 C) (Oral)   Resp 16   SpO2 97%   Visual Acuity Right Eye Distance:   Left Eye Distance:   Bilateral Distance:    Right Eye Near:   Left Eye  Near:    Bilateral Near:     Physical Exam Vitals reviewed.  Constitutional:      General: He is awake.     Appearance: Normal appearance. He is well-developed. He is not ill-appearing.     Comments: Very pleasant male appears stated age in no acute distress sitting comfortably in exam room  HENT:     Head: Normocephalic and atraumatic.     Mouth/Throat:     Pharynx: No oropharyngeal exudate, posterior oropharyngeal erythema or uvula swelling.  Cardiovascular:     Rate and Rhythm: Normal rate and regular rhythm.     Heart sounds: S1 normal and S2 normal. Murmur heard.  Pulmonary:     Effort: Pulmonary effort is normal.     Breath sounds: Normal breath sounds. No stridor. No wheezing, rhonchi or rales.     Comments: Clear to auscultation bilaterally Abdominal:     General: Bowel sounds are normal.  Skin:         Comments: Approximately 8 cm x 5 cm erythematous lesion with central puncture noted popliteal fossa and medial right leg.  No streaking or evidence of lymphangitis.  No bleeding or drainage noted.  No additional lesions on exam.  Neurological:     Mental Status: He is alert.  Psychiatric:        Behavior: Behavior is cooperative.      UC Treatments / Results  Labs (all labs ordered are listed, but only abnormal results are displayed) Labs Reviewed - No data to display  EKG   Radiology No results found.  Procedures Procedures (including critical care time)  Medications Ordered in UC Medications - No data to display  Initial Impression / Assessment and Plan / UC Course  I have reviewed the triage vital signs and the nursing notes.  Pertinent labs & imaging results that were available during my care of the patient were reviewed by me and considered in my medical decision making (see chart for details).     Patient is well-appearing, afebrile, nontoxic, nontachycardic.  Concern for localized allergic reaction given clinical presentation.  Patient with  started on H1 H2 blockade as well as a short course of low-dose prednisone (20 mg for 3 days).  He is not to take NSAIDs with prednisone due to risk of GI bleeding.  Discussed that he is to keep area clean and if there are any signs of infection including additional erythema, pain, fever, nausea, vomiting he needs to be evaluated immediately.  He can use cool compresses to help manage symptoms.  We discussed that if anything worsens or changes he needs to be seen immediately.  Strict return precautions given.  All questions answered to patient satisfaction.  Final Clinical Impressions(s) / UC Diagnoses   Final diagnoses:  Allergic reaction to insect sting, accidental or unintentional, initial encounter     Discharge Instructions      Take cetirizine during the day and famotidine at night.  Start prednisone 20 mg for 3 days.  Do not take NSAIDs with this medication including aspirin, ibuprofen/Advil, naproxen/Aleve.  Use cool compresses to help manage symptoms.  Monitor the area of redness.  If this spreads significantly or you develop any additional symptoms including fever, pain, nausea, vomiting you need to be seen immediately.  Follow-up with your primary care next week.     ED Prescriptions     Medication Sig Dispense Auth. Provider   cetirizine (ZYRTEC) 5 MG tablet Take 1 tablet (5 mg total) by mouth daily. 14 tablet Sherleen Pangborn K, PA-C   famotidine (PEPCID) 20 MG tablet Take 1 tablet (20 mg total) by mouth at bedtime. 14 tablet Arlynn Mcdermid K, PA-C   predniSONE (DELTASONE) 20 MG tablet Take 1 tablet (20 mg total) by mouth daily for 3 days. 3 tablet Norrine Ballester, Noberto Retort, PA-C      PDMP not reviewed this encounter.   Jeani Hawking, PA-C 12/22/22 1347

## 2022-12-22 NOTE — Discharge Instructions (Signed)
Take cetirizine during the day and famotidine at night.  Start prednisone 20 mg for 3 days.  Do not take NSAIDs with this medication including aspirin, ibuprofen/Advil, naproxen/Aleve.  Use cool compresses to help manage symptoms.  Monitor the area of redness.  If this spreads significantly or you develop any additional symptoms including fever, pain, nausea, vomiting you need to be seen immediately.  Follow-up with your primary care next week.

## 2022-12-22 NOTE — ED Triage Notes (Signed)
Pt presents to UC w/ c/o rash d/t insect bite behind right knee since yesterday. Pt has tried anti itch cream which has not helped.

## 2023-05-04 ENCOUNTER — Encounter: Payer: Self-pay | Admitting: Cardiovascular Disease

## 2023-05-05 ENCOUNTER — Telehealth: Payer: Self-pay

## 2023-05-05 NOTE — Telephone Encounter (Signed)
Tried tracking down surgeon for valve replacement to get patient's question answered sooner. Unable to verify if he is still working in this area. However, left message regarding patient info request with the nurse at [facility]. Will f/u if do not receive call back by lunch time today.   Called patient and made him aware of progress. Patient thanked [place] for our diligence. No further questions at this time.

## 2023-05-05 NOTE — Telephone Encounter (Signed)
Patient identification verified by 2 forms. Shade Flood, RN     Called and spoke to patient   Interventions/Plan: Spoke with nurse Tommie Sams from [facility]. She informed me that typically all of the valves that are used for patient's are compatible with MRI, but that ultimately radiology would make the final decision for the patient.  Will send valve make information to patient for him to take to his appt tomorrow.    Patient agrees with plan, no questions at this time

## 2023-09-09 ENCOUNTER — Encounter: Payer: Self-pay | Admitting: Cardiovascular Disease

## 2023-09-12 ENCOUNTER — Telehealth: Payer: Self-pay

## 2023-09-12 NOTE — Telephone Encounter (Signed)
   Pre-operative Risk Assessment    Patient Name: Jerry Odonnell  DOB: 1955/10/12 MRN: [id number]   Date of last office visit: 09/30/2022 Lawana Pray Date of next office visit: 09/28/2023 Dr. Lauro Portal, MD   Request for Surgical Clearance    Procedure:   Cervical Spine Fusion  Date of Surgery:  Clearance TBD                                Surgeon: Dr. Horatio Lynch  Surgeon's Group or Practice Name: [facility]  Phone number: [phone] Fax number: [phone]   Type of Clearance Requested:   - Medical  - Pharmacy:  Hold Aspirin  10 days prior and 7 days post op   Type of Anesthesia:  General    Additional requests/questions:    Tyrus Gallus   09/12/2023, 8:36 AM

## 2023-09-12 NOTE — Telephone Encounter (Signed)
   Name: Jerry Odonnell  DOB: [birth year]  MRN: [id number]  Primary Cardiologist: Lauro Portal, MD  Chart reviewed as part of pre-operative protocol coverage. The patient has an upcoming visit scheduled with Dr. Katheryne Pane on 09/28/2023 at which time clearance can be addressed in case there are any issues that would impact surgical recommendations.  Cervical Spine Fusion is not scheduled until TBD as below. I added preop FYI to appointment note so that provider is aware to address at time of outpatient visit.  Per office protocol the cardiology provider should forward their finalized clearance decision and recommendations regarding antiplatelet therapy to the requesting party below.    I will route this message as FYI to requesting party and remove this message from the preop box as separate preop APP input not needed at this time.   Please call with any questions.  Jude Norton, NP  09/12/2023, 9:01 AM

## 2023-09-22 ENCOUNTER — Ambulatory Visit (HOSPITAL_COMMUNITY): Payer: Medicare HMO | Attending: General Practice

## 2023-09-22 DIAGNOSIS — I340 Nonrheumatic mitral (valve) insufficiency: Secondary | ICD-10-CM | POA: Diagnosis not present

## 2023-09-22 DIAGNOSIS — Z953 Presence of xenogenic heart valve: Secondary | ICD-10-CM | POA: Insufficient documentation

## 2023-09-22 LAB — ECHOCARDIOGRAM COMPLETE
AR max vel: 1.53 cm2
AV Area VTI: 1.57 cm2
AV Area mean vel: 1.53 cm2
AV Mean grad: 14 mmHg
AV Peak grad: 27.4 mmHg
Ao pk vel: 2.62 m/s
Area-P 1/2: 2.79 cm2
S' Lateral: 2.9 cm

## 2023-09-28 ENCOUNTER — Encounter: Payer: Self-pay | Admitting: Cardiovascular Disease

## 2023-09-28 ENCOUNTER — Ambulatory Visit: Attending: Cardiovascular Disease | Admitting: Cardiovascular Disease

## 2023-09-28 VITALS — BP 99/63 | HR 52 | Ht 72.0 in | Wt 203.0 lb

## 2023-09-28 DIAGNOSIS — Z01818 Encounter for other preprocedural examination: Secondary | ICD-10-CM | POA: Insufficient documentation

## 2023-09-28 DIAGNOSIS — I10 Essential (primary) hypertension: Secondary | ICD-10-CM

## 2023-09-28 DIAGNOSIS — E782 Mixed hyperlipidemia: Secondary | ICD-10-CM | POA: Diagnosis not present

## 2023-09-28 DIAGNOSIS — Z953 Presence of xenogenic heart valve: Secondary | ICD-10-CM | POA: Diagnosis not present

## 2023-09-28 NOTE — Assessment & Plan Note (Signed)
 History of essential hypertension with blood pressure measured today at 99/63.  He is on Atacand and metoprolol .  He is chronically bradycardic but asymptomatic from this.

## 2023-09-28 NOTE — Progress Notes (Signed)
 09/28/2023 Jerry Odonnell   [birth year]  [id number]  Primary Physician Aloysius Janus, MD Primary Cardiologist: Avanell Leigh MD Bennye Bravo, [place]  HPI:  Jerry Odonnell is a 68 y.o.    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  He was referred for evaluation and treatment of severe aortic stenosis. I last saw him  in the office 10/10/2021.Jerry Odonnell He has no cardiac risk factors. He is active and walks 4-5 miles a day. PCP heard a murmur and followed up with a 2-D echocardiogram 04/28/16 that revealed normal LV size and function, moderate concentric LVH with severe aortic stenosis. The mean gradient was 86 mm of mercury. He has had one episode of dizziness back in August. He denies chest pain or shortness of breath. My intention was to perform elective right and left heart cath on him 2 weeks after his initial visit however 2 days later he had syncope and was brought to [facility]. He had a right left heart cath performed by Dr. Addie Holstein confirming critical aortic stenosis with minimal CAD. He was seen by Dr. Sherene Dilling and caused consultation the following day and several days later on 07/14/16 he underwent minimally invasive aortic valve replacement by Dr. Alva Jewels with a 23 mm Edwards Intuity rapid deployment pericardial tissue valve. He had some chest wall bleeding requiring reexploration the same day and was discharged 5 days later. He did participate in the rehabilitation program at [facility].   He works out with a trainer 2 days a week at fitness together and does cardio on his own walking, biking and hiking without limitation.  Recent echo performed 09/22/2023 revealed normal LV  function with a normal functioning aortic bioprosthesis with a mean gradient of 14 mmHg.  He had mild AI and mild MR.  Lipid profile performed 3 months ago revealed total cholesterol 175, LDL of 84 and HDL of 81.  He apparently needs a C6-7 fusion at [facility] in June which I think he is a low risk for.  He is also scheduled to go on a trip to [place] on 10/11/2023 to [place] and taking a trip [place].  Current Meds  Medication Sig   aspirin  EC 81 MG tablet Take 81 mg by mouth daily.   atorvastatin  (LIPITOR) 20 MG tablet Take 1 tablet (20 mg total) by mouth daily. (Patient taking differently: Take 40 mg by mouth daily.)   candesartan (ATACAND) 32 MG tablet Take 32 mg by mouth daily.   EPINEPHrine  0.3 mg/0.3 mL IJ SOAJ injection Inject 0.3 mLs into the muscle as directed.   metoprolol  tartrate (LOPRESSOR ) 25 MG tablet Take 0.5 tablets (12.5 mg total) by mouth 2 (two) times daily.     Allergies  Allergen Reactions   Bee Venom Swelling    SWELLING REACTION UNSPECIFIED     Social History   Socioeconomic History   Marital status: Married    Spouse name: Not on file   Number of children: Not on file   Years of education: Not on file   Highest education level: Not on file  Occupational History   Occupation:  R&D Manager    Employer: [employer]  Tobacco Use   Smoking status: Never   Smokeless tobacco: Never  Vaping Use   Vaping status: Never Used  Substance and Sexual Activity   Alcohol use: Yes    Comment: approximately 1 drink per day   Drug use: No   Sexual activity: Not on file  Other Topics Concern   Not on file  Social History Narrative   Not on file   Social Drivers of Health   Financial Resource Strain: Low Risk  (06/15/2023)   Received from [facility]   Overall Financial Resource Strain (CARDIA)    Difficulty of Paying Living Expenses: Not hard at all  Food Insecurity: No Food Insecurity (06/15/2023)   [facility] from [facility]   Hunger Vital Sign    Worried About Running Out of Food in the Last Year: Never true    Ran Out of Food in the Last Year: Never true  Transportation Needs: No Transportation Needs (06/15/2023)   Received from [facility] - Transportation    In the past 12 months, has lack of transportation kept you from medical appointments or from getting medications?: No    Lack of Transportation (Non-Medical): No  Physical Activity: Sufficiently Active (06/15/2023)   Received from [facility]   Exercise Vital Sign    Days of Exercise per Week: 7 days    Minutes of Exercise per Session: 40 min  Stress: No Stress Concern Present (06/15/2023)   Received from [facility] - Occupational Stress Questionnaire    Feeling of Stress : Not at all  Social Connections: Socially Integrated (06/15/2023)   Received from [facility]   Social Connection and Isolation Panel [NHANES]    Frequency of Communication with Friends and Family: More than three times a week    Frequency of Social Gatherings with Friends and Family: Once a week    Attends Religious Services: More than 4 times per year    Active Member of [employer] or Organizations: Yes    Attends Engineer, structural: More than 4 times per year    Marital Status: Married  Catering manager Violence: Not on file     Review of Systems: General: negative for chills, fever, night sweats or weight changes.  Cardiovascular: negative for chest pain, dyspnea on exertion, edema, orthopnea, palpitations, paroxysmal nocturnal dyspnea or shortness of breath Dermatological: negative for rash Respiratory: negative for cough or wheezing Urologic: negative for hematuria Abdominal: negative for nausea, vomiting, diarrhea, bright red blood per rectum, melena, or hematemesis Neurologic: negative for visual changes, syncope,  or dizziness All other systems reviewed and are otherwise negative except as noted above.    Blood pressure 99/63, pulse (!) 52, height 6' (1.829 m), weight 203 lb (92.1 kg), SpO2 98%.  General appearance: alert and no distress Neck: no adenopathy, no carotid bruit, no JVD, supple, symmetrical, trachea midline, and thyroid  not enlarged, symmetric, no tenderness/mass/nodules Lungs: clear to auscultation bilaterally Heart: regular rate and rhythm, S1, S2 normal, no murmur, click, rub or gallop Extremities: extremities normal, atraumatic, no cyanosis or edema Pulses: 2+ and symmetric Skin: Skin color, texture, turgor normal. No rashes or lesions Neurologic: Grossly normal  EKG EKG Interpretation Date/Time:  Wednesday Sep 28 2023 10:31:01 EDT Ventricular Rate:  50 PR Interval:  192 QRS Duration:  136 QT Interval:  454 QTC Calculation:  413 R Axis:   -71  Text Interpretation: Sinus bradycardia Left axis deviation Non-specific intra-ventricular conduction block Minimal voltage criteria for LVH, may be normal variant ( Cornell product ) When compared with ECG of 15-Jul-2016 06:43, Vent. rate has decreased BY  24 BPM Nonspecific T wave abnormality now evident in Anterior leads QT has shortened Confirmed by Lauro Portal [id number]) on 09/28/2023 10:37:16 AM    ASSESSMENT AND PLAN:   Hyperlipidemia History of hyperlipidemia on atorvastatin  40 mg a day with lipid profile performed 3 months ago revealing total cholesterol 175, LDL of 84 and HDL of 81.  S/P minimally invasive aortic valve replacement with bioprosthetic valve History of severe aortic stenosis status post minimally invasive aortic valve replacement by Dr. Alva Jewels  07/14/2016 with a 23 mm Edwards Intuity rapid deployment pericardial tissue valve.  He is extremely active and asymptomatic.  His most recent 2D echo which was performed on annual basis 09/22/2023 revealed normal LV systolic function, mild MR and a normally functioning aortic  bioprosthesis with mild AI.  This will be repeated on an annual basis.  Essential hypertension History of essential hypertension with blood pressure measured today at 99/63.  He is on Atacand and metoprolol .  He is chronically bradycardic but asymptomatic from this.  Preoperative clearance Patient is now 7 years status post bioprosthetic AVR by Dr. Alva Jewels .  Cath prior to that showed minimal nonobstructive CAD.  He is completely asymptomatic and has normal LV function by recent 2D echo.  He is cleared for surgery at low risk.     Avanell Leigh MD FACP,FACC,FAHA, [facility] 09/28/2023 11:05 AM

## 2023-09-28 NOTE — Assessment & Plan Note (Signed)
 History of severe aortic stenosis status post minimally invasive aortic valve replacement by Dr. Alva Jewels  07/14/2016 with a 23 mm Edwards Intuity rapid deployment pericardial tissue valve.  He is extremely active and asymptomatic.  His most recent 2D echo which was performed on annual basis 09/22/2023 revealed normal LV systolic function, mild MR and a normally functioning aortic bioprosthesis with mild AI.  This will be repeated on an annual basis.

## 2023-09-28 NOTE — Patient Instructions (Signed)
 Medication Instructions:  Your physician recommends that you continue on your current medications as directed. Please refer to the Current Medication list given to you today.  *If you need a refill on your cardiac medications before your next appointment, please call your pharmacy*  Testing/Procedures: Your physician has requested that you have an echocardiogram. Echocardiography is a painless test that uses sound waves to create images of your heart. It provides your doctor with information about the size and shape of your heart and how well your heart's chambers and valves are working. This procedure takes approximately one hour. There are no restrictions for this procedure. Please do NOT wear cologne, perfume, aftershave, or lotions (deodorant is allowed). Please arrive 15 minutes prior to your appointment time.  Please note: We ask at that you not bring children with you during ultrasound (echo/ vascular) testing. Due to room size and safety concerns, children are not allowed in the ultrasound rooms during exams. Our front office staff cannot provide observation of children in our lobby area while testing is being conducted. An adult accompanying a patient to their appointment will only be allowed in the ultrasound room at the discretion of the ultrasound technician under special circumstances. We apologize for any inconvenience. **To do in May 2026**   Follow-Up: At [facility], you and your health needs are our priority.  As part of our continuing mission to provide you with exceptional heart care, our providers are all part of one team.  This team includes your primary Cardiologist (physician) and Advanced Practice Providers or APPs (Physician Assistants and Nurse Practitioners) who all work together to provide you with the care you need, when you need it.  Your next appointment:   12 month(s)  Provider:   Lawana Pray, NP         Then, Lauro Portal, MD will plan to see you  again in 2 year(s).    We recommend signing up for the patient portal called "MyChart".  Sign up information is provided on this After Visit Summary.  MyChart is used to connect with patients for Virtual Visits (Telemedicine).  Patients are able to view lab/test results, encounter notes, upcoming appointments, etc.  Non-urgent messages can be sent to your provider as well.   To learn more about what you can do with MyChart, go to [email or web address].

## 2023-09-28 NOTE — Assessment & Plan Note (Signed)
 Patient is now 7 years status post bioprosthetic AVR by Dr. Alva Jewels .  Cath prior to that showed minimal nonobstructive CAD.  He is completely asymptomatic and has normal LV function by recent 2D echo.  He is cleared for surgery at low risk.

## 2023-09-28 NOTE — Assessment & Plan Note (Signed)
 History of hyperlipidemia on atorvastatin  40 mg a day with lipid profile performed 3 months ago revealing total cholesterol 175, LDL of 84 and HDL of 81.

## 2023-09-29 NOTE — Telephone Encounter (Signed)
   Name: Jerry Odonnell  DOB: 1956/03/01  MRN: [id number]   Primary Cardiologist: Lauro Portal, MD  Chart reviewed as part of pre-operative protocol coverage. [name] was last seen on 09/28/2023 by Dr. Katheryne Pane.  Per Dr. Arlester Ladd office note "Patient is now 7 years status post bioprosthetic AVR by Dr. Alva Jewels .  Cath prior to that showed minimal nonobstructive CAD.  He is completely asymptomatic and has normal LV function by recent 2D echo.  He is cleared for surgery at low risk."  Therefore, based on ACC/AHA guidelines, the patient would be an acceptable risk for the planned procedure without further cardiovascular testing.   Per office protocol, he may hold aspirin  for 7 days prior to procedure and should resume as soon as hemodynamically stable postoperatively.   I will route this recommendation to the requesting party via Epic fax function and remove from pre-op pool. Please call with questions.  Morey Ar, NP 09/29/2023, 10:32 AM

## 2023-11-19 ENCOUNTER — Other Ambulatory Visit: Payer: Self-pay | Admitting: General Practice

## 2023-11-21 ENCOUNTER — Encounter: Payer: Self-pay | Admitting: Cardiovascular Disease

## 2023-11-21 MED ORDER — METOPROLOL TARTRATE 25 MG PO TABS: 12.5000 mg | ORAL_TABLET | Freq: Two times a day (BID) | ORAL | 3 refills | Status: AC

## 2024-09-21 ENCOUNTER — Other Ambulatory Visit (HOSPITAL_COMMUNITY)
